# Patient Record
Sex: Female | Born: 1945
Health system: Southern US, Community
[De-identification: ages and names within clinical notes are randomized; demographics above are authoritative.]

## PROBLEM LIST (undated history)

## (undated) DIAGNOSIS — F329 Major depressive disorder, single episode, unspecified: Secondary | ICD-10-CM

## (undated) DIAGNOSIS — M1990 Unspecified osteoarthritis, unspecified site: Secondary | ICD-10-CM

## (undated) DIAGNOSIS — R011 Cardiac murmur, unspecified: Secondary | ICD-10-CM

## (undated) DIAGNOSIS — R42 Dizziness and giddiness: Secondary | ICD-10-CM

## (undated) DIAGNOSIS — L932 Other local lupus erythematosus: Secondary | ICD-10-CM

## (undated) DIAGNOSIS — N189 Chronic kidney disease, unspecified: Secondary | ICD-10-CM

## (undated) DIAGNOSIS — F419 Anxiety disorder, unspecified: Secondary | ICD-10-CM

## (undated) DIAGNOSIS — F32A Depression, unspecified: Secondary | ICD-10-CM

## (undated) DIAGNOSIS — C801 Malignant (primary) neoplasm, unspecified: Secondary | ICD-10-CM

## (undated) DIAGNOSIS — I10 Essential (primary) hypertension: Secondary | ICD-10-CM

## (undated) DIAGNOSIS — K2100 Gastro-esophageal reflux disease with esophagitis, without bleeding: Secondary | ICD-10-CM

## (undated) DIAGNOSIS — M8580 Other specified disorders of bone density and structure, unspecified site: Secondary | ICD-10-CM

## (undated) DIAGNOSIS — N1831 Chronic kidney disease, stage 3a: Secondary | ICD-10-CM

## (undated) DIAGNOSIS — E785 Hyperlipidemia, unspecified: Secondary | ICD-10-CM

## (undated) DIAGNOSIS — G250 Essential tremor: Secondary | ICD-10-CM

## (undated) DIAGNOSIS — M329 Systemic lupus erythematosus, unspecified: Secondary | ICD-10-CM

## (undated) HISTORY — DX: Essential (primary) hypertension: I10

## (undated) HISTORY — DX: Depression, unspecified: F32.A

## (undated) HISTORY — DX: Anxiety disorder, unspecified: F41.9

## (undated) HISTORY — DX: Other local lupus erythematosus: L93.2

## (undated) HISTORY — PX: MOHS SURGERY: SUR867

## (undated) HISTORY — PX: BASAL CELL CARCINOMA EXCISION: SHX1214

## (undated) HISTORY — PX: MASTOIDECTOMY: SHX711

## (undated) HISTORY — DX: Chronic kidney disease, unspecified: N18.9

## (undated) HISTORY — PX: CATARACT EXTRACTION W/ INTRAOCULAR LENS IMPLANT: SHX1309

## (undated) HISTORY — DX: Major depressive disorder, single episode, unspecified: F32.9

## (undated) HISTORY — DX: Hyperlipidemia, unspecified: E78.5

---

## 1998-09-05 HISTORY — PX: BREAST BIOPSY: SHX20

## 2005-09-27 ENCOUNTER — Ambulatory Visit: Payer: Self-pay | Admitting: Gastroenterology

## 2010-12-06 ENCOUNTER — Ambulatory Visit: Payer: Self-pay | Admitting: Family Medicine

## 2010-12-20 ENCOUNTER — Observation Stay: Payer: Self-pay | Admitting: Internal Medicine

## 2011-04-06 ENCOUNTER — Ambulatory Visit: Payer: Self-pay | Admitting: Otolaryngology

## 2011-04-06 HISTORY — PX: IMAGE GUIDED SINUS SURGERY: SHX6570

## 2011-12-08 ENCOUNTER — Ambulatory Visit: Payer: Self-pay | Admitting: Family Medicine

## 2012-12-13 ENCOUNTER — Ambulatory Visit: Payer: Self-pay | Admitting: Family Medicine

## 2013-11-26 ENCOUNTER — Ambulatory Visit: Payer: Self-pay | Admitting: Family Medicine

## 2013-12-16 ENCOUNTER — Ambulatory Visit: Payer: Self-pay | Admitting: Family Medicine

## 2014-12-22 ENCOUNTER — Ambulatory Visit
Admit: 2014-12-22 | Disposition: A | Discharge status: Home / Self Care | Payer: Self-pay | Attending: Family Medicine | Admitting: Family Medicine

## 2015-04-23 DIAGNOSIS — F32A Depression, unspecified: Secondary | ICD-10-CM | POA: Insufficient documentation

## 2015-04-23 DIAGNOSIS — F329 Major depressive disorder, single episode, unspecified: Secondary | ICD-10-CM | POA: Insufficient documentation

## 2015-04-23 DIAGNOSIS — E785 Hyperlipidemia, unspecified: Secondary | ICD-10-CM | POA: Insufficient documentation

## 2015-04-27 ENCOUNTER — Encounter: Payer: Self-pay | Admitting: Family Medicine

## 2015-04-27 ENCOUNTER — Ambulatory Visit (INDEPENDENT_AMBULATORY_CARE_PROVIDER_SITE_OTHER): Payer: Commercial Managed Care - HMO | Admitting: Family Medicine

## 2015-04-27 VITALS — BP 123/71 | HR 66 | Temp 98.6°F | Ht 63.0 in | Wt 131.0 lb

## 2015-04-27 DIAGNOSIS — E785 Hyperlipidemia, unspecified: Secondary | ICD-10-CM

## 2015-04-27 DIAGNOSIS — F32A Depression, unspecified: Secondary | ICD-10-CM

## 2015-04-27 DIAGNOSIS — M76899 Other specified enthesopathies of unspecified lower limb, excluding foot: Secondary | ICD-10-CM | POA: Insufficient documentation

## 2015-04-27 DIAGNOSIS — M65852 Other synovitis and tenosynovitis, left thigh: Secondary | ICD-10-CM | POA: Diagnosis not present

## 2015-04-27 DIAGNOSIS — I10 Essential (primary) hypertension: Secondary | ICD-10-CM | POA: Insufficient documentation

## 2015-04-27 DIAGNOSIS — F329 Major depressive disorder, single episode, unspecified: Secondary | ICD-10-CM | POA: Diagnosis not present

## 2015-04-27 DIAGNOSIS — M76892 Other specified enthesopathies of left lower limb, excluding foot: Secondary | ICD-10-CM

## 2015-04-27 LAB — LP+ALT+AST PICCOLO, WAIVED
ALT (SGPT) Piccolo, Waived: 21 U/L (ref 10–47)
AST (SGOT) PICCOLO, WAIVED: 24 U/L (ref 11–38)
CHOL/HDL RATIO PICCOLO,WAIVE: 2.2 mg/dL
CHOLESTEROL PICCOLO, WAIVED: 165 mg/dL (ref <200)
HDL CHOL PICCOLO, WAIVED: 76 mg/dL (ref >59)
LDL CHOL CALC PICCOLO WAIVED: 63 mg/dL (ref <100)
TRIGLYCERIDES PICCOLO,WAIVED: 130 mg/dL (ref <150)
VLDL Chol Calc Piccolo,Waive: 26 mg/dL (ref <30)

## 2015-04-27 MED ORDER — HYDROCHLOROTHIAZIDE 12.5 MG PO TABS: 12.5000 mg | ORAL_TABLET | Freq: Every day | ORAL | Status: DC

## 2015-04-27 MED ORDER — AMLODIPINE BESYLATE 10 MG PO TABS: 10.0000 mg | ORAL_TABLET | Freq: Every day | ORAL | Status: DC

## 2015-04-27 MED ORDER — BUPROPION HCL ER (SR) 150 MG PO TB12: 150.0000 mg | ORAL_TABLET | Freq: Two times a day (BID) | ORAL | Status: DC

## 2015-04-27 MED ORDER — ATORVASTATIN CALCIUM 20 MG PO TABS: 20.0000 mg | ORAL_TABLET | Freq: Every day | ORAL | Status: DC

## 2015-04-27 MED ORDER — TRAZODONE HCL 50 MG PO TABS: 50.0000 mg | ORAL_TABLET | Freq: Every evening | ORAL | Status: DC | PRN

## 2015-04-27 MED ORDER — BENAZEPRIL HCL 40 MG PO TABS: 40.0000 mg | ORAL_TABLET | Freq: Every day | ORAL | Status: DC

## 2015-04-27 MED ORDER — SERTRALINE HCL 50 MG PO TABS
50.0000 mg | ORAL_TABLET | Freq: Every day | ORAL | Status: DC
Start: 2015-04-27 — End: 2015-06-30

## 2015-04-27 NOTE — Assessment & Plan Note (Signed)
The current medical regimen is effective;  continue present plan and medications.  

## 2015-04-27 NOTE — Assessment & Plan Note (Signed)
Discussed Will and Zoloft 25 mg for 6 days then 50

## 2015-04-27 NOTE — Progress Notes (Signed)
BP 123/71 mmHg  Pulse 66  Temp(Src) 98.6 F (37 C)  Ht 5\' 3"  (1.6 m)  Wt 131 lb (59.421 kg)  BMI 23.21 kg/m2  SpO2 99%   Subjective:    Patient ID: Lindsey Blair, female    DOB: 10-06-1945, 69 y.o.   MRN: [id number]  HPI: Lindsey Blair is a 69 y.o. female  Chief Complaint  Patient presents with  . Hyperlipidemia  . Hypertension  . Depression   patient follow-up above doing well taking blood pressure cholesterol medicine without side effects or problems. Having problems with sleep because left hip hurts which is been a chronic problem for 30+ years starts especially when she lies down to sleep doesn't bother her sitting or being active such as horse riding or walking. Tylenol and Advil and Aleve seems to help just fine not taking Advil etc. because of renal function. Doesn't want to take medications regularly. Reviewed old chart x-ray from 2004 was normal at both hip and spine.  Patient also feels depression is worse may be made worse by hip pain and poor sleep well with treatment starting the spring seem to not work as effectively. Depression scale of 7.   Relevant past medical, surgical, family and social history reviewed and updated as indicated. Interim medical history since our last visit reviewed. Allergies and medications reviewed and updated.  Review of Systems  Constitutional: Negative.   Respiratory: Negative.   Cardiovascular: Negative.     Per HPI unless specifically indicated above     Objective:    BP 123/71 mmHg  Pulse 66  Temp(Src) 98.6 F (37 C)  Ht 5\' 3"  (1.6 m)  Wt 131 lb (59.421 kg)  BMI 23.21 kg/m2  SpO2 99%  Wt Readings from Last 3 Encounters:  04/27/15 131 lb (59.421 kg)  11/20/14 126 lb (57.153 kg)    Physical Exam  Constitutional: She is oriented to person, place, and time. She appears well-developed and well-nourished. No distress.  HENT:  Head: Normocephalic and atraumatic.  Right Ear: Hearing normal.  Left Ear: Hearing  normal.  Nose: Nose normal.  Eyes: Conjunctivae and lids are normal. Right eye exhibits no discharge. Left eye exhibits no discharge. No scleral icterus.  Pulmonary/Chest: Effort normal. No respiratory distress.  Abdominal: Soft. There is no tenderness. There is no rebound.  Musculoskeletal: Normal range of motion.  Hip full range of motion without tenderness cross leg flexion causes pain in her piroformes muscle  Neurological: She is alert and oriented to person, place, and time.  Skin: Skin is intact. No rash noted.  Psychiatric: She has a normal mood and affect. Her speech is normal and behavior is normal. Judgment and thought content normal. Cognition and memory are normal.    No results found for this or any previous visit.    Assessment & Plan:   Problem List Items Addressed This Visit      Cardiovascular and Mediastinum   Hypertension    The current medical regimen is effective;  continue present plan and medications.       Relevant Medications   amLODipine (NORVASC) 10 MG tablet   atorvastatin (LIPITOR) 20 MG tablet   benazepril (LOTENSIN) 40 MG tablet   hydrochlorothiazide (HYDRODIURIL) 12.5 MG tablet   Other Relevant Orders   Basic metabolic panel     Musculoskeletal and Integument   Hip tendonitis    Discussed stretching exercises Using Tylenol        Other   Hyperlipidemia - Primary  The current medical regimen is effective;  continue present plan and medications.       Relevant Medications   amLODipine (NORVASC) 10 MG tablet   atorvastatin (LIPITOR) 20 MG tablet   benazepril (LOTENSIN) 40 MG tablet   hydrochlorothiazide (HYDRODIURIL) 12.5 MG tablet   Other Relevant Orders   LP+ALT+AST Piccolo, Waived   Depression    Discussed Will and Zoloft 25 mg for 6 days then 50      Relevant Medications   buPROPion (WELLBUTRIN SR) 150 MG 12 hr tablet   traZODone (DESYREL) 50 MG tablet   sertraline (ZOLOFT) 50 MG tablet       Follow up plan: Return in  about 2 months (around 06/27/2015), or if symptoms worsen or fail to improve, for f/u meds.

## 2015-04-27 NOTE — Assessment & Plan Note (Signed)
Discussed stretching exercises Using Tylenol

## 2015-04-28 ENCOUNTER — Encounter: Payer: Self-pay | Admitting: Family Medicine

## 2015-04-28 LAB — BASIC METABOLIC PANEL
BUN/Creatinine Ratio: 12 (ref 11–26)
BUN: 12 mg/dL (ref 8–27)
CALCIUM: 9.4 mg/dL (ref 8.7–10.3)
CO2: 25 mmol/L (ref 18–29)
CREATININE: 1.01 mg/dL — AB (ref 0.57–1.00)
Chloride: 95 mmol/L — ABNORMAL LOW (ref 97–108)
GFR, EST AFRICAN AMERICAN: 65 mL/min/{1.73_m2} (ref >59)
GFR, EST NON AFRICAN AMERICAN: 57 mL/min/{1.73_m2} — AB (ref >59)
Glucose: 106 mg/dL — ABNORMAL HIGH (ref 65–99)
POTASSIUM: 4.1 mmol/L (ref 3.5–5.2)
Sodium: 136 mmol/L (ref 134–144)

## 2015-06-29 ENCOUNTER — Ambulatory Visit: Payer: Medicare HMO | Admitting: Family Medicine

## 2015-06-30 ENCOUNTER — Encounter: Payer: Self-pay | Admitting: Family Medicine

## 2015-06-30 ENCOUNTER — Ambulatory Visit (INDEPENDENT_AMBULATORY_CARE_PROVIDER_SITE_OTHER): Payer: Commercial Managed Care - HMO | Admitting: Family Medicine

## 2015-06-30 VITALS — BP 129/74 | HR 62 | Temp 98.8°F | Ht 62.6 in | Wt 130.0 lb

## 2015-06-30 DIAGNOSIS — F329 Major depressive disorder, single episode, unspecified: Secondary | ICD-10-CM | POA: Diagnosis not present

## 2015-06-30 DIAGNOSIS — F32A Depression, unspecified: Secondary | ICD-10-CM

## 2015-06-30 DIAGNOSIS — I10 Essential (primary) hypertension: Secondary | ICD-10-CM

## 2015-06-30 DIAGNOSIS — E785 Hyperlipidemia, unspecified: Secondary | ICD-10-CM | POA: Diagnosis not present

## 2015-06-30 DIAGNOSIS — M65852 Other synovitis and tenosynovitis, left thigh: Secondary | ICD-10-CM

## 2015-06-30 DIAGNOSIS — M76892 Other specified enthesopathies of left lower limb, excluding foot: Secondary | ICD-10-CM

## 2015-06-30 MED ORDER — ZOLPIDEM TARTRATE 5 MG PO TABS: 5.0000 mg | ORAL_TABLET | Freq: Every evening | ORAL | Status: DC | PRN

## 2015-06-30 MED ORDER — AMLODIPINE BESYLATE 10 MG PO TABS: 10.0000 mg | ORAL_TABLET | Freq: Every day | ORAL | Status: DC

## 2015-06-30 MED ORDER — FLUOXETINE HCL 20 MG PO TABS: 20.0000 mg | ORAL_TABLET | Freq: Every day | ORAL | Status: DC

## 2015-06-30 MED ORDER — BENAZEPRIL HCL 40 MG PO TABS: 40.0000 mg | ORAL_TABLET | Freq: Every day | ORAL | Status: DC

## 2015-06-30 MED ORDER — ATORVASTATIN CALCIUM 20 MG PO TABS: 20.0000 mg | ORAL_TABLET | Freq: Every day | ORAL | Status: DC

## 2015-06-30 MED ORDER — BUPROPION HCL ER (SR) 150 MG PO TB12
150.0000 mg | ORAL_TABLET | Freq: Two times a day (BID) | ORAL | Status: DC
Start: 2015-06-30 — End: 2016-01-06

## 2015-06-30 MED ORDER — HYDROCHLOROTHIAZIDE 12.5 MG PO TABS: 12.5000 mg | ORAL_TABLET | Freq: Every day | ORAL | Status: DC

## 2015-06-30 NOTE — Progress Notes (Signed)
BP 129/74 mmHg  Pulse 62  Temp(Src) 98.8 F (37.1 C)  Ht 5' 2.6" (1.59 m)  Wt 130 lb (58.968 kg)  BMI 23.33 kg/m2  SpO2 99%   Subjective:    Patient ID: Lindsey Blair, female    DOB: 08/12/1946, 68 y.o.   MRN: [id number]  HPI: Lindsey Blair is a 69 y.o. female  Chief Complaint  Patient presents with  . Depression   patient doing well depressions larger resolved having better energy and feeling better. Patient is having side effects of loose bowel movements and urgency. Also having some chest fluttering sensation not associated with exertion but more anxiety-like symptoms.  Got released from nephrology as well as renal functions doing well no further scheduled visits with nephrology.  Relevant past medical, surgical, family and social history reviewed and updated as indicated. Interim medical history since our last visit reviewed. Allergies and medications reviewed and updated.  Review of Systems  Constitutional: Negative.   Respiratory: Negative.   Cardiovascular: Negative.     Per HPI unless specifically indicated above     Objective:    BP 129/74 mmHg  Pulse 62  Temp(Src) 98.8 F (37.1 C)  Ht 5' 2.6" (1.59 m)  Wt 130 lb (58.968 kg)  BMI 23.33 kg/m2  SpO2 99%  Wt Readings from Last 3 Encounters:  06/30/15 130 lb (58.968 kg)  04/27/15 131 lb (59.421 kg)  11/20/14 126 lb (57.153 kg)    Physical Exam  Constitutional: She is oriented to person, place, and time. She appears well-developed and well-nourished. No distress.  HENT:  Head: Normocephalic and atraumatic.  Right Ear: Hearing normal.  Left Ear: Hearing normal.  Nose: Nose normal.  Eyes: Conjunctivae and lids are normal. Right eye exhibits no discharge. Left eye exhibits no discharge. No scleral icterus.  Cardiovascular: Normal rate, regular rhythm and normal heart sounds.   Pulmonary/Chest: Effort normal and breath sounds normal. No respiratory distress.  Musculoskeletal: Normal range of motion.   Neurological: She is alert and oriented to person, place, and time.  Skin: Skin is intact. No rash noted.  Psychiatric: She has a normal mood and affect. Her speech is normal and behavior is normal. Judgment and thought content normal. Cognition and memory are normal.    Results for orders placed or performed in visit on 62/69/48  Basic metabolic panel  Result Value Ref Range   Glucose 106 (H) 65 - 99 mg/dL   BUN 12 8 - 27 mg/dL   Creatinine, Ser 1.01 (H) 0.57 - 1.00 mg/dL   GFR calc non Af Amer 57 (L) >59 mL/min/1.73   GFR calc Af Amer 65 >59 mL/min/1.73   BUN/Creatinine Ratio 12 11 - 26   Sodium 136 134 - 144 mmol/L   Potassium 4.1 3.5 - 5.2 mmol/L   Chloride 95 (L) 97 - 108 mmol/L   CO2 25 18 - 29 mmol/L   Calcium 9.4 8.7 - 10.3 mg/dL  LP+ALT+AST Piccolo, Waived  Result Value Ref Range   ALT (SGPT) Piccolo, Waived 21 10 - 47 U/L   AST (SGOT) Piccolo, Waived 24 11 - 38 U/L   Cholesterol Piccolo, Waived 165 <200 mg/dL   HDL Chol Piccolo, Waived 76 >59 mg/dL   Triglycerides Piccolo,Waived 130 <150 mg/dL   Chol/HDL Ratio Piccolo,Waive 2.2 mg/dL   LDL Chol Calc Piccolo Waived 63 <100 mg/dL   VLDL Chol Calc Piccolo,Waive 26 <30 mg/dL      Assessment & Plan:   Problem List Items  Addressed This Visit      Cardiovascular and Mediastinum   Hypertension - Primary    The current medical regimen is effective;  continue present plan and medications.       Relevant Medications   amLODipine (NORVASC) 10 MG tablet   atorvastatin (LIPITOR) 20 MG tablet   benazepril (LOTENSIN) 40 MG tablet   hydrochlorothiazide (HYDRODIURIL) 12.5 MG tablet     Musculoskeletal and Integument   Hip tendonitis    Doing better        Other   Hyperlipidemia   Relevant Medications   amLODipine (NORVASC) 10 MG tablet   atorvastatin (LIPITOR) 20 MG tablet   benazepril (LOTENSIN) 40 MG tablet   hydrochlorothiazide (HYDRODIURIL) 12.5 MG tablet   Depression    Doing much better. But having side  effects so will discontinue begin fluoxetine      Relevant Medications   buPROPion (WELLBUTRIN SR) 150 MG 12 hr tablet   FLUoxetine (PROZAC) 20 MG tablet       Follow up plan: Return in about 3 months (around 09/30/2015).

## 2015-06-30 NOTE — Assessment & Plan Note (Signed)
The current medical regimen is effective;  continue present plan and medications.  

## 2015-06-30 NOTE — Assessment & Plan Note (Signed)
Doing better.   

## 2015-06-30 NOTE — Assessment & Plan Note (Signed)
Doing much better. But having side effects so will discontinue begin fluoxetine

## 2015-07-01 ENCOUNTER — Telehealth: Payer: Self-pay

## 2015-07-01 NOTE — Telephone Encounter (Signed)
Medication you gave her "doesn't work worth a flip"  She wants what you gave her in 2014, I looked  Ambien 10mg  was late 2013  She would like you to send a 90 day rx to [facility]

## 2015-07-01 NOTE — Telephone Encounter (Signed)
Call plz 

## 2015-07-27 ENCOUNTER — Other Ambulatory Visit: Payer: Self-pay | Admitting: Family Medicine

## 2015-10-07 ENCOUNTER — Encounter: Payer: Self-pay | Admitting: Family Medicine

## 2015-10-07 ENCOUNTER — Ambulatory Visit (INDEPENDENT_AMBULATORY_CARE_PROVIDER_SITE_OTHER): Payer: PPO | Admitting: Family Medicine

## 2015-10-07 VITALS — BP 134/70 | HR 65 | Temp 98.7°F | Ht 63.0 in | Wt 132.0 lb

## 2015-10-07 DIAGNOSIS — I10 Essential (primary) hypertension: Secondary | ICD-10-CM | POA: Diagnosis not present

## 2015-10-07 DIAGNOSIS — F329 Major depressive disorder, single episode, unspecified: Secondary | ICD-10-CM | POA: Diagnosis not present

## 2015-10-07 DIAGNOSIS — Z1211 Encounter for screening for malignant neoplasm of colon: Secondary | ICD-10-CM

## 2015-10-07 DIAGNOSIS — F32A Depression, unspecified: Secondary | ICD-10-CM

## 2015-10-07 MED ORDER — CITALOPRAM HYDROBROMIDE 20 MG PO TABS: 20.0000 mg | ORAL_TABLET | Freq: Every day | ORAL | Status: DC

## 2015-10-07 NOTE — Assessment & Plan Note (Signed)
The current medical regimen is effective;  continue present plan and medications.  

## 2015-10-07 NOTE — Progress Notes (Signed)
BP 134/70 mmHg  Pulse 65  Temp(Src) 98.7 F (37.1 C)  Ht 5\' 3"  (1.6 m)  Wt 132 lb (59.875 kg)  BMI 23.39 kg/m2  SpO2 99%   Subjective:    Patient ID: Lindsey Blair, female    DOB: [birth year], 70 y.o.   MRN: [id number]  HPI: Lindsey Blair is a 70 y.o. female  Chief Complaint  Patient presents with  . Anxiety  . patient due Td    cut on right thumb   cut thumb Yesterday Doing Okay healing well gave patient fresh Band-Aid here in the office Also fluoxetine is not as good as Zoloft but was having side effects with Zoloft will switch to citalopram Blood pressures doing well no complaints Relevant past medical, surgical, family and social history reviewed and updated as indicated. Interim medical history since our last visit reviewed. Allergies and medications reviewed and updated.  Review of Systems  Constitutional: Negative.   Respiratory: Negative.   Cardiovascular: Negative.     Per HPI unless specifically indicated above     Objective:    BP 134/70 mmHg  Pulse 65  Temp(Src) 98.7 F (37.1 C)  Ht 5\' 3"  (1.6 m)  Wt 132 lb (59.875 kg)  BMI 23.39 kg/m2  SpO2 99%  Wt Readings from Last 3 Encounters:  10/07/15 132 lb (59.875 kg)  06/30/15 130 lb (58.968 kg)  04/27/15 131 lb (59.421 kg)    Physical Exam  Constitutional: She is oriented to person, place, and time. She appears well-developed and well-nourished. No distress.  HENT:  Head: Normocephalic and atraumatic.  Right Ear: Hearing normal.  Left Ear: Hearing normal.  Nose: Nose normal.  Eyes: Conjunctivae and lids are normal. Right eye exhibits no discharge. Left eye exhibits no discharge. No scleral icterus.  Cardiovascular: Normal rate, regular rhythm and normal heart sounds.   Pulmonary/Chest: Effort normal and breath sounds normal. No respiratory distress.  Musculoskeletal: Normal range of motion.  Neurological: She is alert and oriented to person, place, and time.  Skin: Skin is intact. No rash  noted.  Psychiatric: She has a normal mood and affect. Her speech is normal and behavior is normal. Judgment and thought content normal. Cognition and memory are normal.    Results for orders placed or performed in visit on [id number]  Basic metabolic panel  Result Value Ref Range   Glucose 106 (H) 65 - 99 mg/dL   BUN 12 8 - 27 mg/dL   Creatinine, Ser 1.01 (H) 0.57 - 1.00 mg/dL   GFR calc non Af Amer 57 (L) >59 mL/min/1.73   GFR calc Af Amer 65 >59 mL/min/1.73   BUN/Creatinine Ratio 12 11 - 26   Sodium 136 134 - 144 mmol/L   Potassium 4.1 3.5 - 5.2 mmol/L   Chloride 95 (L) 97 - 108 mmol/L   CO2 25 18 - 29 mmol/L   Calcium 9.4 8.7 - 10.3 mg/dL  LP+ALT+AST Piccolo, Waived  Result Value Ref Range   ALT (SGPT) Piccolo, Waived 21 10 - 47 U/L   AST (SGOT) Piccolo, Waived 24 11 - 38 U/L   Cholesterol Piccolo, Waived 165 <200 mg/dL   HDL Chol Piccolo, Waived 76 >59 mg/dL   Triglycerides Piccolo,Waived 130 <150 mg/dL   Chol/HDL Ratio Piccolo,Waive 2.2 mg/dL   LDL Chol Calc Piccolo Waived 63 <100 mg/dL   VLDL Chol Calc Piccolo,Waive 26 <30 mg/dL      Assessment & Plan:   Problem List Items Addressed This Visit  Cardiovascular and Mediastinum   Hypertension    The current medical regimen is effective;  continue present plan and medications.         Other   Depression    We will start fluoxetine start citalopram Recheck at physical earlier if problems      Relevant Medications   citalopram (CELEXA) 20 MG tablet    Other Visit Diagnoses    Colon cancer screening    -  Primary    Relevant Orders    Ambulatory referral to General Surgery        Follow up plan: Return in about 3 months (around 01/04/2016) for Physical Exam.

## 2015-10-07 NOTE — Assessment & Plan Note (Signed)
We will start fluoxetine start citalopram Recheck at physical earlier if problems

## 2015-10-08 ENCOUNTER — Ambulatory Visit (INDEPENDENT_AMBULATORY_CARE_PROVIDER_SITE_OTHER): Payer: PPO

## 2015-10-08 DIAGNOSIS — S61001A Unspecified open wound of right thumb without damage to nail, initial encounter: Secondary | ICD-10-CM

## 2015-10-08 DIAGNOSIS — Z23 Encounter for immunization: Secondary | ICD-10-CM

## 2015-10-09 ENCOUNTER — Other Ambulatory Visit: Payer: Self-pay

## 2015-10-09 ENCOUNTER — Telehealth: Payer: Self-pay

## 2015-10-09 NOTE — Telephone Encounter (Signed)
Gastroenterology Pre-Procedure Review  Request Date: 10/22/15 Requesting Physician: Dr. Jeananne Rama  PATIENT REVIEW QUESTIONS: The patient responded to the following health history questions as indicated:    1. Are you having any GI issues? no 2. Do you have a personal history of Polyps? no 3. Do you have a family history of Colon Cancer or Polyps? no 4. Diabetes Mellitus? no 5. Joint replacements in the past 12 months?no 6. Major health problems in the past 3 months?no 7. Any artificial heart valves, MVP, or defibrillator?no    MEDICATIONS & ALLERGIES:    Patient reports the following regarding taking any anticoagulation/antiplatelet therapy:   Plavix, Coumadin, Eliquis, Xarelto, Lovenox, Pradaxa, Brilinta, or Effient? no Aspirin? yes (ASA 81mg )  Patient confirms/reports the following medications:  Current Outpatient Prescriptions  Medication Sig Dispense Refill  . amLODipine (NORVASC) 10 MG tablet Take 1 tablet (10 mg total) by mouth daily. 90 tablet 2  . aspirin EC 81 MG tablet Take 81 mg by mouth daily.    Marland Kitchen atorvastatin (LIPITOR) 20 MG tablet Take 1 tablet (20 mg total) by mouth daily. 90 tablet 2  . benazepril (LOTENSIN) 40 MG tablet Take 1 tablet (40 mg total) by mouth daily. 90 tablet 2  . Biotin 5000 MCG TABS Take by mouth daily.    Marland Kitchen buPROPion (WELLBUTRIN SR) 150 MG 12 hr tablet Take 1 tablet (150 mg total) by mouth 2 (two) times daily. 180 tablet 2  . citalopram (CELEXA) 20 MG tablet Take 1 tablet (20 mg total) by mouth daily. 30 tablet 3  . hydrochlorothiazide (HYDRODIURIL) 12.5 MG tablet Take 1 tablet (12.5 mg total) by mouth daily. 90 tablet 2  . hydroxychloroquine (PLAQUENIL) 200 MG tablet     . zolpidem (AMBIEN) 5 MG tablet Take 1 tablet (5 mg total) by mouth at bedtime as needed for sleep. 20 tablet 1   No current facility-administered medications for this visit.    Patient confirms/reports the following allergies:  Allergies  Allergen Reactions  . Penicillins  Rash    No orders of the defined types were placed in this encounter.    AUTHORIZATION INFORMATION Primary Insurance: 1D#: Group #:  Secondary Insurance: 1D#: Group #:  SCHEDULE INFORMATION: Date: 10/22/15 Time: Location: [place]

## 2015-10-15 ENCOUNTER — Encounter: Payer: Self-pay | Admitting: *Deleted

## 2015-10-21 NOTE — Discharge Instructions (Signed)

## 2015-10-22 ENCOUNTER — Ambulatory Visit
Admission: RE | Admit: 2015-10-22 | Discharge: 2015-10-22 | Disposition: A | Discharge status: Home / Self Care | Payer: PPO | Source: Ambulatory Visit | Attending: Gastroenterology | Admitting: Gastroenterology

## 2015-10-22 ENCOUNTER — Encounter
Admission: RE | Disposition: A | Discharge status: Home / Self Care | Payer: Self-pay | Source: Ambulatory Visit | Attending: Gastroenterology

## 2015-10-22 ENCOUNTER — Ambulatory Visit: Payer: PPO | Admitting: Anesthesiology

## 2015-10-22 ENCOUNTER — Encounter: Payer: Self-pay | Admitting: *Deleted

## 2015-10-22 DIAGNOSIS — K2100 Gastro-esophageal reflux disease with esophagitis, without bleeding: Secondary | ICD-10-CM | POA: Insufficient documentation

## 2015-10-22 DIAGNOSIS — Z823 Family history of stroke: Secondary | ICD-10-CM | POA: Diagnosis not present

## 2015-10-22 DIAGNOSIS — R1013 Epigastric pain: Secondary | ICD-10-CM | POA: Insufficient documentation

## 2015-10-22 DIAGNOSIS — F329 Major depressive disorder, single episode, unspecified: Secondary | ICD-10-CM | POA: Insufficient documentation

## 2015-10-22 DIAGNOSIS — Z7982 Long term (current) use of aspirin: Secondary | ICD-10-CM | POA: Diagnosis not present

## 2015-10-22 DIAGNOSIS — I129 Hypertensive chronic kidney disease with stage 1 through stage 4 chronic kidney disease, or unspecified chronic kidney disease: Secondary | ICD-10-CM | POA: Diagnosis not present

## 2015-10-22 DIAGNOSIS — L932 Other local lupus erythematosus: Secondary | ICD-10-CM | POA: Diagnosis not present

## 2015-10-22 DIAGNOSIS — Z809 Family history of malignant neoplasm, unspecified: Secondary | ICD-10-CM | POA: Diagnosis not present

## 2015-10-22 DIAGNOSIS — E785 Hyperlipidemia, unspecified: Secondary | ICD-10-CM | POA: Insufficient documentation

## 2015-10-22 DIAGNOSIS — K2970 Gastritis, unspecified, without bleeding: Secondary | ICD-10-CM | POA: Diagnosis not present

## 2015-10-22 DIAGNOSIS — R42 Dizziness and giddiness: Secondary | ICD-10-CM | POA: Diagnosis not present

## 2015-10-22 DIAGNOSIS — K640 First degree hemorrhoids: Secondary | ICD-10-CM | POA: Diagnosis not present

## 2015-10-22 DIAGNOSIS — F419 Anxiety disorder, unspecified: Secondary | ICD-10-CM | POA: Insufficient documentation

## 2015-10-22 DIAGNOSIS — K449 Diaphragmatic hernia without obstruction or gangrene: Secondary | ICD-10-CM | POA: Diagnosis not present

## 2015-10-22 DIAGNOSIS — K5730 Diverticulosis of large intestine without perforation or abscess without bleeding: Secondary | ICD-10-CM | POA: Diagnosis not present

## 2015-10-22 DIAGNOSIS — Z880 Allergy status to penicillin: Secondary | ICD-10-CM | POA: Insufficient documentation

## 2015-10-22 DIAGNOSIS — Z87891 Personal history of nicotine dependence: Secondary | ICD-10-CM | POA: Insufficient documentation

## 2015-10-22 DIAGNOSIS — Z1211 Encounter for screening for malignant neoplasm of colon: Secondary | ICD-10-CM | POA: Insufficient documentation

## 2015-10-22 DIAGNOSIS — N189 Chronic kidney disease, unspecified: Secondary | ICD-10-CM | POA: Diagnosis not present

## 2015-10-22 DIAGNOSIS — K2930 Chronic superficial gastritis without bleeding: Secondary | ICD-10-CM | POA: Diagnosis not present

## 2015-10-22 DIAGNOSIS — Z79899 Other long term (current) drug therapy: Secondary | ICD-10-CM | POA: Insufficient documentation

## 2015-10-22 DIAGNOSIS — Z8249 Family history of ischemic heart disease and other diseases of the circulatory system: Secondary | ICD-10-CM | POA: Diagnosis not present

## 2015-10-22 DIAGNOSIS — R1011 Right upper quadrant pain: Secondary | ICD-10-CM | POA: Diagnosis not present

## 2015-10-22 HISTORY — PX: ESOPHAGOGASTRODUODENOSCOPY (EGD) WITH PROPOFOL: SHX5813

## 2015-10-22 HISTORY — DX: Dizziness and giddiness: R42

## 2015-10-22 HISTORY — PX: COLONOSCOPY WITH PROPOFOL: SHX5780

## 2015-10-22 SURGERY — COLONOSCOPY WITH PROPOFOL
Anesthesia: Monitor Anesthesia Care | Wound class: Contaminated

## 2015-10-22 MED ORDER — LIDOCAINE HCL (CARDIAC) 20 MG/ML IV SOLN
INTRAVENOUS | Status: DC | PRN
  Administered 2015-10-22: 50 mg via INTRAVENOUS

## 2015-10-22 MED ORDER — STERILE WATER FOR IRRIGATION IR SOLN
Status: DC | PRN
  Administered 2015-10-22: 10:00:00

## 2015-10-22 MED ORDER — PROPOFOL 10 MG/ML IV BOLUS
INTRAVENOUS | Status: DC | PRN
  Administered 2015-10-22 (×3): 20 mg via INTRAVENOUS
  Administered 2015-10-22: 30 mg via INTRAVENOUS
  Administered 2015-10-22: 70 mg via INTRAVENOUS
  Administered 2015-10-22: 10 mg via INTRAVENOUS
  Administered 2015-10-22: 20 mg via INTRAVENOUS
  Administered 2015-10-22: 10 mg via INTRAVENOUS
  Administered 2015-10-22: 20 mg via INTRAVENOUS
  Administered 2015-10-22: 30 mg via INTRAVENOUS

## 2015-10-22 MED ORDER — GLYCOPYRROLATE 0.2 MG/ML IJ SOLN
INTRAMUSCULAR | Status: DC | PRN
  Administered 2015-10-22: 0.2 mg via INTRAVENOUS

## 2015-10-22 MED ORDER — ACETAMINOPHEN 160 MG/5ML PO SOLN: 325.0000 mg | ORAL | Status: DC | PRN

## 2015-10-22 MED ORDER — LACTATED RINGERS IV SOLN
INTRAVENOUS | Status: DC
  Administered 2015-10-22: 09:00:00 via INTRAVENOUS

## 2015-10-22 MED ORDER — ACETAMINOPHEN 325 MG PO TABS: 325.0000 mg | ORAL_TABLET | ORAL | Status: DC | PRN

## 2015-10-22 SURGICAL SUPPLY — 39 items

## 2015-10-22 NOTE — Op Note (Signed)
[facility] Gastroenterology Patient Name: Lindsey Blair Procedure Date: 10/22/2015 9:26 AM MRN: [id number] Account #: [id number] Date of Birth: [birth year] Admit Type: Outpatient Age: 70 Room: [facility] OR ROOM 01 Gender: Female Note Status: Finalized Procedure:            Upper GI endoscopy Indications:          Epigastric abdominal pain Providers:            Lucilla Lame, MD Referring MD:         Guadalupe Maple, MD (Referring MD) Medicines:            Propofol per Anesthesia Complications:        No immediate complications. Procedure:            Pre-Anesthesia Assessment:                       - Prior to the procedure, a History and Physical was                        performed, and patient medications and allergies were                        reviewed. The patient's tolerance of previous                        anesthesia was also reviewed. The risks and benefits of                        the procedure and the sedation options and risks were                        discussed with the patient. All questions were                        answered, and informed consent was obtained. Prior                        Anticoagulants: The patient has taken no previous                        anticoagulant or antiplatelet agents. ASA Grade                        Assessment: II - A patient with mild systemic disease.                        After reviewing the risks and benefits, the patient was                        deemed in satisfactory condition to undergo the                        procedure.                       After obtaining informed consent, the endoscope was                        passed under direct vision. Throughout the procedure,  the patient's blood pressure, pulse, and oxygen                        saturations were monitored continuously. The Olympus                        GIF H180J endoscope (S#: [id number]) was introduced    through the mouth, and advanced to the second part of                        duodenum. The upper GI endoscopy was accomplished                        without difficulty. The patient tolerated the procedure                        well. Findings:      A small hiatal hernia was present.      LA Grade A (one or more mucosal breaks less than 5 mm, not extending       between tops of 2 mucosal folds) esophagitis with no bleeding was found       at the gastroesophageal junction.      Localized severe inflammation characterized by shallow ulcerations was       found in the gastric antrum. Biopsies were taken with a cold forceps for       histology.      The examined duodenum was normal. Impression:           - Small hiatal hernia.                       - LA Grade A reflux esophagitis.                       - Gastritis. Biopsied.                       - Normal examined duodenum. Recommendation:       - Await pathology results. Procedure Code(s):    --- Professional ---                       [phone], Esophagogastroduodenoscopy, flexible, transoral;                        with biopsy, single or multiple Diagnosis Code(s):    --- Professional ---                       R10.13, Epigastric pain                       K44.9, Diaphragmatic hernia without obstruction or                        gangrene                       K21.0, Gastro-esophageal reflux disease with esophagitis                       K29.70, Gastritis, unspecified, without bleeding CPT copyright 2016 American Medical Association. All rights reserved. The codes documented in this report are preliminary and upon coder review may  be revised to meet current compliance requirements. Lucilla Lame, MD 10/22/2015 9:41:10 AM This report has been signed electronically. Number of Addenda: 0 Note Initiated On: 10/22/2015 9:26 AM Total Procedure Duration: 0 hours 1 minute 58 seconds       [facility]

## 2015-10-22 NOTE — Anesthesia Preprocedure Evaluation (Signed)
Anesthesia Evaluation  Patient identified by MRN, date of birth, ID band  Reviewed: Allergy & Precautions, H&P , NPO status , Patient's Chart, lab work & pertinent test results  Airway Mallampati: II  TM Distance: >3 FB Neck ROM: full    Dental no notable dental hx.    Pulmonary former smoker,    Pulmonary exam normal        Cardiovascular hypertension,  Rhythm:regular Rate:Normal     Neuro/Psych PSYCHIATRIC DISORDERS    GI/Hepatic   Endo/Other    Renal/GU      Musculoskeletal   Abdominal   Peds  Hematology   Anesthesia Other Findings   Reproductive/Obstetrics                             Anesthesia Physical Anesthesia Plan  ASA: II  Anesthesia Plan: MAC   Post-op Pain Management:    Induction:   Airway Management Planned:   Additional Equipment:   Intra-op Plan:   Post-operative Plan:   Informed Consent: I have reviewed the patients History and Physical, chart, labs and discussed the procedure including the risks, benefits and alternatives for the proposed anesthesia with the patient or authorized representative who has indicated his/her understanding and acceptance.     Plan Discussed with: CRNA  Anesthesia Plan Comments:         Anesthesia Quick Evaluation

## 2015-10-22 NOTE — Anesthesia Postprocedure Evaluation (Signed)
Anesthesia Post Note  Patient: Lindsey Blair  Procedure(s) Performed: Procedure(s) (LRB): COLONOSCOPY WITH PROPOFOL (N/A) ESOPHAGOGASTRODUODENOSCOPY (EGD) WITH PROPOFOL (N/A)  Patient location during evaluation: PACU Anesthesia Type: MAC Level of consciousness: awake and alert and oriented Pain management: satisfactory to patient Vital Signs Assessment: post-procedure vital signs reviewed and stable Respiratory status: spontaneous breathing, nonlabored ventilation and respiratory function stable Cardiovascular status: blood pressure returned to baseline and stable Postop Assessment: Adequate PO intake and No signs of nausea or vomiting Anesthetic complications: no    Raliegh Ip

## 2015-10-22 NOTE — Anesthesia Procedure Notes (Signed)
Procedure Name: MAC Performed by: Volanda Mangine Pre-anesthesia Checklist: Patient identified, Emergency Drugs available, Suction available, Timeout performed and Patient being monitored Patient Re-evaluated:Patient Re-evaluated prior to inductionOxygen Delivery Method: Nasal cannula Placement Confirmation: positive ETCO2       

## 2015-10-22 NOTE — H&P (Signed)
[facility]  [address] [place], [place] [address] Phone: [phone] Fax : [phone]  Primary Care Physician:  Golden Pop, MD Primary Gastroenterologist:  Dr. Allen Norris  Pre-Procedure History & Physical: HPI:  Lindsey Blair is a 70 y.o. female is here for an endoscopy and colonoscopy.   Past Medical History  Diagnosis Date  . Anxiety   . Hyperlipidemia   . Hypertension   . Depression   . Cutaneous lupus erythematosus   . Chronic kidney disease   . Vertigo     rare    Past Surgical History  Procedure Laterality Date  . Mastoidectomy    . Image guided sinus surgery  04/06/11    [facility], Dr. Pryor Ochoa    Prior to Admission medications   Medication Sig Start Date End Date Taking? Authorizing Provider  amLODipine (NORVASC) 10 MG tablet Take 1 tablet (10 mg total) by mouth daily. 06/30/15  Yes Guadalupe Maple, MD  aspirin EC 81 MG tablet Take 81 mg by mouth daily.   Yes Historical Provider, MD  atorvastatin (LIPITOR) 20 MG tablet Take 1 tablet (20 mg total) by mouth daily. 06/30/15  Yes Guadalupe Maple, MD  benazepril (LOTENSIN) 40 MG tablet Take 1 tablet (40 mg total) by mouth daily. 06/30/15  Yes Guadalupe Maple, MD  Biotin 5000 MCG TABS Take by mouth daily.   Yes Historical Provider, MD  buPROPion (WELLBUTRIN SR) 150 MG 12 hr tablet Take 1 tablet (150 mg total) by mouth 2 (two) times daily. 06/30/15  Yes Guadalupe Maple, MD  citalopram (CELEXA) 20 MG tablet Take 1 tablet (20 mg total) by mouth daily. 10/07/15  Yes Guadalupe Maple, MD  hydrochlorothiazide (HYDRODIURIL) 12.5 MG tablet Take 1 tablet (12.5 mg total) by mouth daily. 06/30/15  Yes Guadalupe Maple, MD  ranitidine (ZANTAC) 75 MG tablet Take 75 mg by mouth 2 (two) times daily as needed for heartburn.   Yes Historical Provider, MD    Allergies as of 10/09/2015 - Review Complete 10/09/2015  Allergen Reaction Noted  . Penicillins Rash 04/23/2015    Family History  Problem Relation Age of Onset    . Heart disease Mother   . Cancer Father   . Stroke Sister   . Cancer Brother     Social History   Social History  . Marital Status: Married    Spouse Name: N/A  . Number of Children: N/A  . Years of Education: N/A   Occupational History  . Not on file.   Social History Main Topics  . Smoking status: Former Smoker    Types: Cigarettes    Quit date: 03/08/1965  . Smokeless tobacco: Never Used  . Alcohol Use: 8.4 oz/week    7 Glasses of wine, 7 Cans of beer, 0 Standard drinks or equivalent per week  . Drug Use: No  . Sexual Activity: Not on file   Other Topics Concern  . Not on file   Social History Narrative    Review of Systems: See HPI, otherwise negative ROS  Physical Exam: BP 131/68 mmHg  Pulse 62  Temp(Src) 97.5 F (36.4 C)  Resp 16  Ht 5\' 3"  (1.6 m)  Wt 129 lb (58.514 kg)  BMI 22.86 kg/m2  SpO2 100% General:   Alert,  pleasant and cooperative in NAD Head:  Normocephalic and atraumatic. Neck:  Supple; no masses or thyromegaly. Lungs:  Clear throughout to auscultation.    Heart:  Regular rate and rhythm. Abdomen:  Soft, nontender and nondistended. Normal bowel sounds, without guarding, and without rebound.   Neurologic:  Alert and  oriented x4;  grossly normal neurologically.  Impression/Plan: Lindsey Blair is here for an endoscopy and colonoscopy to be performed for epigastric pain and screening  Risks, benefits, limitations, and alternatives regarding  endoscopy and colonoscopy have been reviewed with the patient.  Questions have been answered.  All parties agreeable.   Ollen Bowl, MD  10/22/2015, 8:59 AM

## 2015-10-22 NOTE — Transfer of Care (Signed)
Immediate Anesthesia Transfer of Care Note  Patient: Lindsey Blair  Procedure(s) Performed: Procedure(s): COLONOSCOPY WITH PROPOFOL (N/A) ESOPHAGOGASTRODUODENOSCOPY (EGD) WITH PROPOFOL (N/A)  Patient Location: PACU  Anesthesia Type: MAC  Level of Consciousness: awake, alert  and patient cooperative  Airway and Oxygen Therapy: Patient Spontanous Breathing and Patient connected to supplemental oxygen  Post-op Assessment: Post-op Vital signs reviewed, Patient's Cardiovascular Status Stable, Respiratory Function Stable, Patent Airway and No signs of Nausea or vomiting  Post-op Vital Signs: Reviewed and stable  Complications: No apparent anesthesia complications

## 2015-10-22 NOTE — Op Note (Signed)
[facility] Patient Name: Lindsey Blair Procedure Date: 10/22/2015 9:26 AM MRN: [id number] Account #: [id number] Date of Birth: Apr 26, 1946 Admit Type: Outpatient Age: 70 Room: [facility] OR ROOM 01 Gender: Female Note Status: Finalized Procedure:            Colonoscopy Indications:          Screening for colorectal malignant neoplasm Providers:            Lucilla Lame, MD Medicines:            Propofol per Anesthesia Complications:        No immediate complications. Procedure:            Pre-Anesthesia Assessment:                       - Prior to the procedure, a History and Physical was                        performed, and patient medications and allergies were                        reviewed. The patient's tolerance of previous                        anesthesia was also reviewed. The risks and benefits of                        the procedure and the sedation options and risks were                        discussed with the patient. All questions were                        answered, and informed consent was obtained. Prior                        Anticoagulants: The patient has taken no previous                        anticoagulant or antiplatelet agents. ASA Grade                        Assessment: II - A patient with mild systemic disease.                        After reviewing the risks and benefits, the patient was                        deemed in satisfactory condition to undergo the                        procedure.                       After obtaining informed consent, the colonoscope was                        passed under direct vision. Throughout the procedure,                        the patient's blood pressure, pulse,  and oxygen                        saturations were monitored continuously. The Olympus                        CF-HQ190L Colonoscope (S#. [id number]) was introduced                        through the anus and advanced to the the  cecum,                        identified by appendiceal orifice and ileocecal valve.                        The colonoscopy was performed without difficulty. The                        patient tolerated the procedure well. The quality of                        the bowel preparation was adequate. Findings:      The perianal and digital rectal examinations were normal.      Multiple small-mouthed diverticula were found in the sigmoid colon.      Non-bleeding internal hemorrhoids were found during retroflexion. The       hemorrhoids were Grade I (internal hemorrhoids that do not prolapse). Impression:           - Diverticulosis in the sigmoid colon.                       - Non-bleeding internal hemorrhoids.                       - No specimens collected. Recommendation:       - Repeat colonoscopy in 10 years for screening unless                        any change in family history or lower GI problems. Procedure Code(s):    --- Professional ---                       [id number], Colonoscopy, flexible; diagnostic, including                        collection of specimen(s) by brushing or washing, when                        performed (separate procedure) Diagnosis Code(s):    --- Professional ---                       Z12.11, Encounter for screening for malignant neoplasm                        of colon CPT copyright 2016 American Medical Association. All rights reserved. The codes documented in this report are preliminary and upon coder review may  be revised to meet current compliance requirements. Lucilla Lame, MD 10/22/2015 9:54:48 AM This report has been signed electronically. Number of Addenda: 0 Note Initiated On: 10/22/2015 9:26 AM Scope Withdrawal Time: 0 hours 6 minutes 36  seconds  Total Procedure Duration: 0 hours 9 minutes 49 seconds       [facility]

## 2015-10-23 ENCOUNTER — Encounter: Payer: Self-pay | Admitting: Gastroenterology

## 2015-10-27 ENCOUNTER — Encounter: Payer: Self-pay | Admitting: Gastroenterology

## 2015-11-03 ENCOUNTER — Telehealth: Payer: Self-pay | Admitting: Gastroenterology

## 2015-11-03 ENCOUNTER — Other Ambulatory Visit: Payer: Self-pay

## 2015-11-03 DIAGNOSIS — K2100 Gastro-esophageal reflux disease with esophagitis, without bleeding: Secondary | ICD-10-CM

## 2015-11-03 MED ORDER — DEXLANSOPRAZOLE 60 MG PO CPDR: 60.0000 mg | DELAYED_RELEASE_CAPSULE | Freq: Every day | ORAL | Status: DC

## 2015-11-03 NOTE — Telephone Encounter (Signed)
New rx for Dexilant has been sent to pt's pharmacy.

## 2015-11-03 NOTE — Telephone Encounter (Signed)
Dr. Allen Norris wanted to call her in a new medication for the inflammation in her stomach lining. She wasn't sure what it was called.

## 2015-11-27 ENCOUNTER — Encounter: Payer: Self-pay | Admitting: Gastroenterology

## 2015-12-07 ENCOUNTER — Encounter: Payer: Self-pay | Admitting: Gastroenterology

## 2015-12-07 DIAGNOSIS — R809 Proteinuria, unspecified: Secondary | ICD-10-CM | POA: Diagnosis not present

## 2015-12-07 DIAGNOSIS — I10 Essential (primary) hypertension: Secondary | ICD-10-CM | POA: Diagnosis not present

## 2015-12-07 DIAGNOSIS — M329 Systemic lupus erythematosus, unspecified: Secondary | ICD-10-CM | POA: Diagnosis not present

## 2015-12-11 ENCOUNTER — Telehealth: Payer: Self-pay | Admitting: Gastroenterology

## 2015-12-11 ENCOUNTER — Other Ambulatory Visit: Payer: Self-pay

## 2015-12-11 MED ORDER — PANTOPRAZOLE SODIUM 40 MG PO TBEC: 40.0000 mg | DELAYED_RELEASE_TABLET | Freq: Every day | ORAL | Status: DC

## 2015-12-11 NOTE — Telephone Encounter (Signed)
Pt notified I have sent the Pantoprazole to her pharmacy.

## 2015-12-11 NOTE — Telephone Encounter (Signed)
Patient called and said her insurance will not cover [place]. Can you give her something else.

## 2016-01-04 DIAGNOSIS — D2261 Melanocytic nevi of right upper limb, including shoulder: Secondary | ICD-10-CM | POA: Diagnosis not present

## 2016-01-04 DIAGNOSIS — D225 Melanocytic nevi of trunk: Secondary | ICD-10-CM | POA: Diagnosis not present

## 2016-01-04 DIAGNOSIS — L821 Other seborrheic keratosis: Secondary | ICD-10-CM | POA: Diagnosis not present

## 2016-01-04 DIAGNOSIS — Z85828 Personal history of other malignant neoplasm of skin: Secondary | ICD-10-CM | POA: Diagnosis not present

## 2016-01-06 ENCOUNTER — Ambulatory Visit (INDEPENDENT_AMBULATORY_CARE_PROVIDER_SITE_OTHER): Payer: PPO | Admitting: Family Medicine

## 2016-01-06 ENCOUNTER — Encounter: Payer: Self-pay | Admitting: Family Medicine

## 2016-01-06 VITALS — BP 125/74 | HR 59 | Temp 98.1°F | Ht 63.3 in | Wt 127.0 lb

## 2016-01-06 DIAGNOSIS — K2100 Gastro-esophageal reflux disease with esophagitis, without bleeding: Secondary | ICD-10-CM

## 2016-01-06 DIAGNOSIS — Z0000 Encounter for general adult medical examination without abnormal findings: Secondary | ICD-10-CM | POA: Diagnosis not present

## 2016-01-06 DIAGNOSIS — E785 Hyperlipidemia, unspecified: Secondary | ICD-10-CM

## 2016-01-06 DIAGNOSIS — I10 Essential (primary) hypertension: Secondary | ICD-10-CM | POA: Diagnosis not present

## 2016-01-06 DIAGNOSIS — L932 Other local lupus erythematosus: Secondary | ICD-10-CM | POA: Diagnosis not present

## 2016-01-06 DIAGNOSIS — M7632 Iliotibial band syndrome, left leg: Secondary | ICD-10-CM

## 2016-01-06 DIAGNOSIS — F329 Major depressive disorder, single episode, unspecified: Secondary | ICD-10-CM

## 2016-01-06 DIAGNOSIS — Z1231 Encounter for screening mammogram for malignant neoplasm of breast: Secondary | ICD-10-CM

## 2016-01-06 DIAGNOSIS — F32A Depression, unspecified: Secondary | ICD-10-CM

## 2016-01-06 DIAGNOSIS — M7630 Iliotibial band syndrome, unspecified leg: Secondary | ICD-10-CM | POA: Insufficient documentation

## 2016-01-06 LAB — MICROSCOPIC EXAMINATION
BACTERIA UA: NONE SEEN
EPITHELIAL CELLS (NON RENAL): NONE SEEN /HPF (ref 0–10)
RBC, UA: NONE SEEN /hpf (ref 0–?)
WBC, UA: NONE SEEN /hpf (ref 0–?)

## 2016-01-06 LAB — URINALYSIS, ROUTINE W REFLEX MICROSCOPIC
Bilirubin, UA: NEGATIVE
GLUCOSE, UA: NEGATIVE
KETONES UA: NEGATIVE
LEUKOCYTES UA: NEGATIVE
Nitrite, UA: NEGATIVE
RBC, UA: NEGATIVE
SPEC GRAV UA: 1.015 (ref 1.005–1.030)
Urobilinogen, Ur: 0.2 mg/dL (ref 0.2–1.0)
pH, UA: 7.5 (ref 5.0–7.5)

## 2016-01-06 MED ORDER — ATORVASTATIN CALCIUM 20 MG PO TABS: 20.0000 mg | ORAL_TABLET | Freq: Every day | ORAL | Status: DC

## 2016-01-06 MED ORDER — HYDROCHLOROTHIAZIDE 12.5 MG PO TABS: 12.5000 mg | ORAL_TABLET | Freq: Every day | ORAL | Status: DC

## 2016-01-06 MED ORDER — AMLODIPINE BESYLATE 10 MG PO TABS: 10.0000 mg | ORAL_TABLET | Freq: Every day | ORAL | Status: DC

## 2016-01-06 MED ORDER — CITALOPRAM HYDROBROMIDE 20 MG PO TABS: 20.0000 mg | ORAL_TABLET | Freq: Every day | ORAL | Status: DC

## 2016-01-06 MED ORDER — BENAZEPRIL HCL 40 MG PO TABS: 40.0000 mg | ORAL_TABLET | Freq: Every day | ORAL | Status: DC

## 2016-01-06 MED ORDER — PANTOPRAZOLE SODIUM 40 MG PO TBEC: 40.0000 mg | DELAYED_RELEASE_TABLET | Freq: Every day | ORAL | Status: DC

## 2016-01-06 MED ORDER — BUPROPION HCL ER (SR) 150 MG PO TB12: 150.0000 mg | ORAL_TABLET | Freq: Two times a day (BID) | ORAL | Status: DC

## 2016-01-06 NOTE — Assessment & Plan Note (Signed)
The current medical regimen is effective;  continue present plan and medications.  

## 2016-01-06 NOTE — Assessment & Plan Note (Signed)
Discuss Stretching care and treatment referral to physical therapy gentle use of meloxicam

## 2016-01-06 NOTE — Progress Notes (Signed)
BP 125/74 mmHg  Pulse 59  Temp(Src) 98.1 F (36.7 C)  Ht 5' 3.3" (1.608 m)  Wt 127 lb (57.607 kg)  BMI 22.28 kg/m2  SpO2 97%   Subjective:    Patient ID: Lindsey Blair, female    DOB: August 20, 1946, 70 y.o.   MRN: [id number]  HPI: Lindsey Blair is a 70 y.o. female  Chief Complaint  Patient presents with  . Annual Exam   Patient with complaints of left lateral leg IT bands left trochanteric bursa pain irritation starting after trying to get on a much taller horse palliatively has also fallen off a horse lately and some consequential aches and pains.  Protonix doing well for stomach flux issues resolved Blood pressure doing well no complaints  cholesterol doing well no complaints  Nerves doing well no complaints    Relevant past medical, surgical, family and social history reviewed and updated as indicated. Interim medical history since our last visit reviewed. Allergies and medications reviewed and updated.  Other than above Review of Systems  Constitutional: Negative.   HENT: Negative.   Eyes: Negative.   Respiratory: Negative.   Cardiovascular: Negative.   Gastrointestinal: Negative.   Endocrine: Negative.   Genitourinary: Negative.   Musculoskeletal: Negative.   Skin: Negative.   Allergic/Immunologic: Negative.   Neurological: Negative.   Hematological: Negative.   Psychiatric/Behavioral: Negative.     Per HPI unless specifically indicated above     Objective:    BP 125/74 mmHg  Pulse 59  Temp(Src) 98.1 F (36.7 C)  Ht 5' 3.3" (1.608 m)  Wt 127 lb (57.607 kg)  BMI 22.28 kg/m2  SpO2 97%  Wt Readings from Last 3 Encounters:  01/06/16 127 lb (57.607 kg)  10/22/15 129 lb (58.514 kg)  10/07/15 132 lb (59.875 kg)    Physical Exam  Constitutional: She is oriented to person, place, and time. She appears well-developed and well-nourished.  HENT:  Head: Normocephalic and atraumatic.  Right Ear: External ear normal.  Left Ear: External ear normal.   Nose: Nose normal.  Mouth/Throat: Oropharynx is clear and moist.  Eyes: Conjunctivae and EOM are normal. Pupils are equal, round, and reactive to light.  Neck: Normal range of motion. Neck supple. Carotid bruit is not present.  Cardiovascular: Normal rate, regular rhythm and normal heart sounds.   No murmur heard. Pulmonary/Chest: Effort normal and breath sounds normal. She exhibits no mass. Right breast exhibits no mass, no skin change and no tenderness. Left breast exhibits no mass, no skin change and no tenderness. Breasts are symmetrical.  Abdominal: Soft. Bowel sounds are normal. There is no hepatosplenomegaly.  Musculoskeletal: Normal range of motion.  Neurological: She is alert and oriented to person, place, and time.  Skin: No rash noted.  Psychiatric: She has a normal mood and affect. Her behavior is normal. Judgment and thought content normal.    Results for orders placed or performed in visit on [id number]  Basic metabolic panel  Result Value Ref Range   Glucose 106 (H) 65 - 99 mg/dL   BUN 12 8 - 27 mg/dL   Creatinine, Ser 1.01 (H) 0.57 - 1.00 mg/dL   GFR calc non Af Amer 57 (L) >59 mL/min/1.73   GFR calc Af Amer 65 >59 mL/min/1.73   BUN/Creatinine Ratio 12 11 - 26   Sodium 136 134 - 144 mmol/L   Potassium 4.1 3.5 - 5.2 mmol/L   Chloride 95 (L) 97 - 108 mmol/L   CO2 25 18 - 29  mmol/L   Calcium 9.4 8.7 - 10.3 mg/dL  LP+ALT+AST Piccolo, Waived  Result Value Ref Range   ALT (SGPT) Piccolo, Waived 21 10 - 47 U/L   AST (SGOT) Piccolo, Waived 24 11 - 38 U/L   Cholesterol Piccolo, Waived 165 <200 mg/dL   HDL Chol Piccolo, Waived 76 >59 mg/dL   Triglycerides Piccolo,Waived 130 <150 mg/dL   Chol/HDL Ratio Piccolo,Waive 2.2 mg/dL   LDL Chol Calc Piccolo Waived 63 <100 mg/dL   VLDL Chol Calc Piccolo,Waive 26 <30 mg/dL      Assessment & Plan:   Problem List Items Addressed This Visit      Cardiovascular and Mediastinum   Hypertension    The current medical regimen is  effective;  continue present plan and medications.       Relevant Medications   amLODipine (NORVASC) 10 MG tablet   atorvastatin (LIPITOR) 20 MG tablet   benazepril (LOTENSIN) 40 MG tablet   hydrochlorothiazide (HYDRODIURIL) 12.5 MG tablet     Digestive   Reflux esophagitis    The current medical regimen is effective;  continue present plan and medications.       Relevant Medications   pantoprazole (PROTONIX) 40 MG tablet     Musculoskeletal and Integument   IT band syndrome    Discuss Stretching care and treatment referral to physical therapy gentle use of meloxicam      Relevant Orders   Ambulatory referral to Physical Therapy   Cutaneous lupus erythematosus    Currently quiescent        Other   Hyperlipidemia    The current medical regimen is effective;  continue present plan and medications.       Relevant Medications   amLODipine (NORVASC) 10 MG tablet   atorvastatin (LIPITOR) 20 MG tablet   benazepril (LOTENSIN) 40 MG tablet   hydrochlorothiazide (HYDRODIURIL) 12.5 MG tablet   Depression    The current medical regimen is effective;  continue present plan and medications.       Relevant Medications   buPROPion (WELLBUTRIN SR) 150 MG 12 hr tablet   citalopram (CELEXA) 20 MG tablet    Other Visit Diagnoses    Routine general medical examination at a health care facility    -  Primary    Relevant Orders    CBC with Differential/Platelet    Comprehensive metabolic panel    Lipid Panel w/o Chol/HDL Ratio    TSH    Urinalysis, Routine w reflex microscopic (not at [facility])    Healthcare maintenance        Relevant Orders    Hepatitis C Antibody    Encounter for screening mammogram for breast cancer        Relevant Orders    MM Digital Screening        Follow up plan: Return in about 6 months (around 07/08/2016) for BMP, lipids, alt, ast.

## 2016-01-06 NOTE — Assessment & Plan Note (Signed)
Currently quiescent

## 2016-01-07 ENCOUNTER — Telehealth: Payer: Self-pay | Admitting: Family Medicine

## 2016-01-07 LAB — COMPREHENSIVE METABOLIC PANEL
A/G RATIO: 2 (ref 1.2–2.2)
ALT: 22 IU/L (ref 0–32)
AST: 24 IU/L (ref 0–40)
Albumin: 4.3 g/dL (ref 3.5–4.8)
Alkaline Phosphatase: 78 IU/L (ref 39–117)
BILIRUBIN TOTAL: 0.6 mg/dL (ref 0.0–1.2)
BUN/Creatinine Ratio: 12 (ref 12–28)
BUN: 13 mg/dL (ref 8–27)
CALCIUM: 9.6 mg/dL (ref 8.7–10.3)
CHLORIDE: 103 mmol/L (ref 96–106)
CO2: 23 mmol/L (ref 18–29)
Creatinine, Ser: 1.13 mg/dL — ABNORMAL HIGH (ref 0.57–1.00)
GFR calc Af Amer: 57 mL/min/{1.73_m2} — ABNORMAL LOW (ref >59)
GFR, EST NON AFRICAN AMERICAN: 49 mL/min/{1.73_m2} — AB (ref >59)
GLUCOSE: 78 mg/dL (ref 65–99)
Globulin, Total: 2.1 g/dL (ref 1.5–4.5)
POTASSIUM: 4.5 mmol/L (ref 3.5–5.2)
Sodium: 143 mmol/L (ref 134–144)
Total Protein: 6.4 g/dL (ref 6.0–8.5)

## 2016-01-07 LAB — CBC WITH DIFFERENTIAL/PLATELET
BASOS ABS: 0 10*3/uL (ref 0.0–0.2)
Basos: 1 %
EOS (ABSOLUTE): 0.1 10*3/uL (ref 0.0–0.4)
Eos: 3 %
HEMATOCRIT: 42.1 % (ref 34.0–46.6)
Hemoglobin: 14 g/dL (ref 11.1–15.9)
Immature Grans (Abs): 0 10*3/uL (ref 0.0–0.1)
Immature Granulocytes: 0 %
LYMPHS ABS: 1.5 10*3/uL (ref 0.7–3.1)
Lymphs: 34 %
MCH: 28.3 pg (ref 26.6–33.0)
MCHC: 33.3 g/dL (ref 31.5–35.7)
MCV: 85 fL (ref 79–97)
MONOS ABS: 0.4 10*3/uL (ref 0.1–0.9)
Monocytes: 10 %
Neutrophils Absolute: 2.2 10*3/uL (ref 1.4–7.0)
Neutrophils: 52 %
Platelets: 181 10*3/uL (ref 150–379)
RBC: 4.95 x10E6/uL (ref 3.77–5.28)
RDW: 12.2 % — AB (ref 12.3–15.4)
WBC: 4.3 10*3/uL (ref 3.4–10.8)

## 2016-01-07 LAB — LIPID PANEL W/O CHOL/HDL RATIO
Cholesterol, Total: 166 mg/dL (ref 100–199)
HDL: 74 mg/dL (ref >39)
LDL Calculated: 77 mg/dL (ref 0–99)
TRIGLYCERIDES: 76 mg/dL (ref 0–149)
VLDL Cholesterol Cal: 15 mg/dL (ref 5–40)

## 2016-01-07 LAB — TSH: TSH: 1.07 u[IU]/mL (ref 0.450–4.500)

## 2016-01-07 LAB — HEPATITIS C ANTIBODY: Hep C Virus Ab: <0.1 s/co ratio (ref 0.0–0.9)

## 2016-01-07 NOTE — Telephone Encounter (Signed)
Phone call Discussed with patient modest decline in renal function which is similar to 2011 and 2014 patient taking aspirin but no other nonsteroidal anti-inflammatory drugs. Patient will stop aspirin observe recheck BMP in the fall when she comes back

## 2016-01-11 DIAGNOSIS — M79652 Pain in left thigh: Secondary | ICD-10-CM | POA: Diagnosis not present

## 2016-01-13 DIAGNOSIS — M79652 Pain in left thigh: Secondary | ICD-10-CM | POA: Diagnosis not present

## 2016-01-19 DIAGNOSIS — M79652 Pain in left thigh: Secondary | ICD-10-CM | POA: Diagnosis not present

## 2016-01-25 DIAGNOSIS — M79652 Pain in left thigh: Secondary | ICD-10-CM | POA: Diagnosis not present

## 2016-01-27 ENCOUNTER — Ambulatory Visit
Admission: RE | Admit: 2016-01-27 | Discharge: 2016-01-27 | Disposition: A | Discharge status: Home / Self Care | Payer: PPO | Source: Ambulatory Visit | Attending: Family Medicine | Admitting: Family Medicine

## 2016-01-27 DIAGNOSIS — Z1231 Encounter for screening mammogram for malignant neoplasm of breast: Secondary | ICD-10-CM | POA: Diagnosis not present

## 2016-01-27 HISTORY — DX: Malignant (primary) neoplasm, unspecified: C80.1

## 2016-05-20 DIAGNOSIS — H2511 Age-related nuclear cataract, right eye: Secondary | ICD-10-CM | POA: Diagnosis not present

## 2016-06-20 DIAGNOSIS — R809 Proteinuria, unspecified: Secondary | ICD-10-CM | POA: Diagnosis not present

## 2016-06-20 DIAGNOSIS — M329 Systemic lupus erythematosus, unspecified: Secondary | ICD-10-CM | POA: Diagnosis not present

## 2016-06-20 DIAGNOSIS — R319 Hematuria, unspecified: Secondary | ICD-10-CM | POA: Diagnosis not present

## 2016-06-20 DIAGNOSIS — I10 Essential (primary) hypertension: Secondary | ICD-10-CM | POA: Diagnosis not present

## 2016-06-20 LAB — HM COLONOSCOPY

## 2016-07-04 DIAGNOSIS — N183 Chronic kidney disease, stage 3 (moderate): Secondary | ICD-10-CM | POA: Diagnosis not present

## 2016-07-04 DIAGNOSIS — L931 Subacute cutaneous lupus erythematosus: Secondary | ICD-10-CM | POA: Diagnosis not present

## 2016-07-04 DIAGNOSIS — I10 Essential (primary) hypertension: Secondary | ICD-10-CM | POA: Diagnosis not present

## 2016-07-04 DIAGNOSIS — M329 Systemic lupus erythematosus, unspecified: Secondary | ICD-10-CM | POA: Diagnosis not present

## 2016-07-12 ENCOUNTER — Ambulatory Visit: Payer: PPO | Admitting: Family Medicine

## 2016-07-20 ENCOUNTER — Encounter: Payer: Self-pay | Admitting: Family Medicine

## 2016-07-20 ENCOUNTER — Ambulatory Visit (INDEPENDENT_AMBULATORY_CARE_PROVIDER_SITE_OTHER): Payer: PPO | Admitting: Family Medicine

## 2016-07-20 VITALS — BP 136/72 | HR 70 | Temp 98.6°F | Wt 130.3 lb

## 2016-07-20 DIAGNOSIS — E7800 Pure hypercholesterolemia, unspecified: Secondary | ICD-10-CM | POA: Diagnosis not present

## 2016-07-20 DIAGNOSIS — F3342 Major depressive disorder, recurrent, in full remission: Secondary | ICD-10-CM

## 2016-07-20 DIAGNOSIS — I10 Essential (primary) hypertension: Secondary | ICD-10-CM | POA: Diagnosis not present

## 2016-07-20 DIAGNOSIS — N952 Postmenopausal atrophic vaginitis: Secondary | ICD-10-CM | POA: Diagnosis not present

## 2016-07-20 LAB — LP+ALT+AST PICCOLO, WAIVED
ALT (SGPT) PICCOLO, WAIVED: 19 U/L (ref 10–47)
AST (SGOT) PICCOLO, WAIVED: 22 U/L (ref 11–38)
CHOLESTEROL PICCOLO, WAIVED: 176 mg/dL (ref <200)
Chol/HDL Ratio Piccolo,Waive: 2.6 mg/dL
HDL Chol Piccolo, Waived: 68 mg/dL (ref >59)
LDL Chol Calc Piccolo Waived: 74 mg/dL (ref <100)
Triglycerides Piccolo,Waived: 175 mg/dL — ABNORMAL HIGH (ref <150)
VLDL Chol Calc Piccolo,Waive: 35 mg/dL — ABNORMAL HIGH (ref <30)

## 2016-07-20 MED ORDER — ESTRADIOL 0.1 MG/GM VA CREA: 1.0000 | TOPICAL_CREAM | VAGINAL | 12 refills | Status: DC

## 2016-07-20 NOTE — Progress Notes (Signed)
BP 136/72   Pulse 70   Temp 98.6 F (37 C)   Wt 130 lb 4.8 oz (59.1 kg)   SpO2 99%   BMI 22.86 kg/m    Subjective:    Patient ID: Lindsey Blair, female    DOB: [birth year], 70 y.o.   MRN: [id number]  HPI: Lindsey Blair is a 70 y.o. female  Chief Complaint  Patient presents with  . Hyperlipidemia  . Hypertension  . Depression   Patient follow-up cholesterol blood pressure and depression all in all doing well. Got fed up with medications and tried to stop blood pressure promptly went up has restarted amlodipine and Benzapril 20 mg blood pressures doing okay. Was able to stop hydrochlorothiazide. Cholesterols back on Lipitor 20 mg with no problems no side effects Was able to stop citalopram and maintain her nerves steadily over this fall doing fine with bupropion and then trazodone for sleep. Patient having some jitteriness may be from Wellbutrin is interested in maybe a change back to citalopram Relevant past medical, surgical, family and social history reviewed and updated as indicated. Interim medical history since our last visit reviewed. Allergies and medications reviewed and updated.  Review of Systems  Constitutional: Negative.   Respiratory: Negative.   Cardiovascular: Negative.     Per HPI unless specifically indicated above     Objective:    BP 136/72   Pulse 70   Temp 98.6 F (37 C)   Wt 130 lb 4.8 oz (59.1 kg)   SpO2 99%   BMI 22.86 kg/m   Wt Readings from Last 3 Encounters:  07/20/16 130 lb 4.8 oz (59.1 kg)  01/06/16 127 lb (57.6 kg)  10/22/15 129 lb (58.5 kg)    Physical Exam  Constitutional: She is oriented to person, place, and time. She appears well-developed and well-nourished. No distress.  HENT:  Head: Normocephalic and atraumatic.  Right Ear: Hearing normal.  Left Ear: Hearing normal.  Nose: Nose normal.  Eyes: Conjunctivae and lids are normal. Right eye exhibits no discharge. Left eye exhibits no discharge. No scleral icterus.    Pulmonary/Chest: Effort normal. No respiratory distress.  Musculoskeletal: Normal range of motion.  Neurological: She is alert and oriented to person, place, and time.  Skin: Skin is intact. No rash noted.  Psychiatric: She has a normal mood and affect. Her speech is normal and behavior is normal. Judgment and thought content normal. Cognition and memory are normal.    Results for orders placed or performed in visit on 07/20/16  HM COLONOSCOPY  Result Value Ref Range   HM Colonoscopy See Report (in chart) See Report (in chart), Patient Reported      Assessment & Plan:   Problem List Items Addressed This Visit      Cardiovascular and Mediastinum   Hypertension    The current medical regimen is effective;  continue present plan and medications.       Relevant Orders   Basic metabolic panel     Genitourinary   Atrophic vaginitis    Trial of estrace vag cream        Other   Hyperlipidemia - Primary    The current medical regimen is effective;  continue present plan and medications.       Relevant Orders   LP+ALT+AST Piccolo, Waived   Depression    After discussion we will decrease Wellbutrin to 1 at bedtime for a week then discontinue*citalopram 20 mg 1 a day observe depression symptoms  Relevant Medications   traZODone (DESYREL) 50 MG tablet       Follow up plan: Return in about 6 months (around 01/17/2017) for Physical Exam.

## 2016-07-20 NOTE — Assessment & Plan Note (Signed)
The current medical regimen is effective;  continue present plan and medications.  

## 2016-07-20 NOTE — Assessment & Plan Note (Signed)
Trial of estrace vag cream

## 2016-07-20 NOTE — Assessment & Plan Note (Signed)
After discussion we will decrease Wellbutrin to 1 at bedtime for a week then discontinue*citalopram 20 mg 1 a day observe depression symptoms

## 2016-07-21 ENCOUNTER — Encounter: Payer: Self-pay | Admitting: Family Medicine

## 2016-07-21 LAB — BASIC METABOLIC PANEL
BUN / CREAT RATIO: 12 (ref 12–28)
BUN: 12 mg/dL (ref 8–27)
CALCIUM: 9.3 mg/dL (ref 8.7–10.3)
CHLORIDE: 94 mmol/L — AB (ref 96–106)
CO2: 25 mmol/L (ref 18–29)
Creatinine, Ser: 0.97 mg/dL (ref 0.57–1.00)
GFR calc non Af Amer: 59 mL/min/{1.73_m2} — ABNORMAL LOW (ref >59)
GFR, EST AFRICAN AMERICAN: 68 mL/min/{1.73_m2} (ref >59)
Glucose: 87 mg/dL (ref 65–99)
POTASSIUM: 3.6 mmol/L (ref 3.5–5.2)
Sodium: 135 mmol/L (ref 134–144)

## 2016-09-06 DIAGNOSIS — Z85828 Personal history of other malignant neoplasm of skin: Secondary | ICD-10-CM | POA: Diagnosis not present

## 2016-09-06 DIAGNOSIS — L931 Subacute cutaneous lupus erythematosus: Secondary | ICD-10-CM | POA: Diagnosis not present

## 2016-09-06 DIAGNOSIS — D225 Melanocytic nevi of trunk: Secondary | ICD-10-CM | POA: Diagnosis not present

## 2016-09-06 DIAGNOSIS — D2271 Melanocytic nevi of right lower limb, including hip: Secondary | ICD-10-CM | POA: Diagnosis not present

## 2016-09-06 DIAGNOSIS — L570 Actinic keratosis: Secondary | ICD-10-CM | POA: Diagnosis not present

## 2016-09-06 DIAGNOSIS — X32XXXA Exposure to sunlight, initial encounter: Secondary | ICD-10-CM | POA: Diagnosis not present

## 2016-09-20 DIAGNOSIS — L931 Subacute cutaneous lupus erythematosus: Secondary | ICD-10-CM | POA: Diagnosis not present

## 2016-12-20 DIAGNOSIS — L931 Subacute cutaneous lupus erythematosus: Secondary | ICD-10-CM | POA: Diagnosis not present

## 2016-12-21 DIAGNOSIS — N183 Chronic kidney disease, stage 3 (moderate): Secondary | ICD-10-CM | POA: Diagnosis not present

## 2016-12-21 DIAGNOSIS — M329 Systemic lupus erythematosus, unspecified: Secondary | ICD-10-CM | POA: Diagnosis not present

## 2016-12-21 DIAGNOSIS — I129 Hypertensive chronic kidney disease with stage 1 through stage 4 chronic kidney disease, or unspecified chronic kidney disease: Secondary | ICD-10-CM | POA: Diagnosis not present

## 2017-01-09 ENCOUNTER — Other Ambulatory Visit: Payer: Self-pay | Admitting: Family Medicine

## 2017-01-11 DIAGNOSIS — Z5181 Encounter for therapeutic drug level monitoring: Secondary | ICD-10-CM | POA: Diagnosis not present

## 2017-01-11 DIAGNOSIS — L931 Subacute cutaneous lupus erythematosus: Secondary | ICD-10-CM | POA: Diagnosis not present

## 2017-01-25 ENCOUNTER — Other Ambulatory Visit: Payer: Self-pay | Admitting: Family Medicine

## 2017-01-25 DIAGNOSIS — Z1231 Encounter for screening mammogram for malignant neoplasm of breast: Secondary | ICD-10-CM

## 2017-02-02 ENCOUNTER — Telehealth: Payer: Self-pay | Admitting: Family Medicine

## 2017-02-02 NOTE — Telephone Encounter (Signed)
Spoke with Dr. Jeananne Rama and he verbalized our policy that we no longer write [employer]. Left message on machine for pt to return call to the office.

## 2017-02-02 NOTE — Telephone Encounter (Signed)
Left message on machine for pt to return call to the office.  

## 2017-02-02 NOTE — Telephone Encounter (Signed)
Pt made aware. Pt would like a referral for low libido. Will pt need OV for referral, where should pt be referred?

## 2017-02-02 NOTE — Telephone Encounter (Signed)
Patient would like a prescription of testosterone cream sent to her house. Patient informed that prescriptions protocol and process. Patient is aware that prescriptions are usually picked up at pharmacy or office but would still like to speak with CMA regarding medication.   Please Advise.   Thank you

## 2017-02-09 ENCOUNTER — Ambulatory Visit
Admission: RE | Admit: 2017-02-09 | Discharge: 2017-02-09 | Disposition: A | Discharge status: Home / Self Care | Payer: PPO | Source: Ambulatory Visit | Attending: Family Medicine | Admitting: Family Medicine

## 2017-02-09 DIAGNOSIS — Z1231 Encounter for screening mammogram for malignant neoplasm of breast: Secondary | ICD-10-CM

## 2017-02-15 NOTE — Telephone Encounter (Signed)
Provider spoke with patient

## 2017-02-17 ENCOUNTER — Ambulatory Visit (INDEPENDENT_AMBULATORY_CARE_PROVIDER_SITE_OTHER): Payer: PPO

## 2017-02-17 VITALS — BP 112/64 | HR 62 | Temp 98.0°F | Resp 16 | Ht 63.0 in | Wt 129.1 lb

## 2017-02-17 DIAGNOSIS — Z13820 Encounter for screening for osteoporosis: Secondary | ICD-10-CM | POA: Diagnosis not present

## 2017-02-17 DIAGNOSIS — Z0000 Encounter for general adult medical examination without abnormal findings: Secondary | ICD-10-CM | POA: Diagnosis not present

## 2017-02-17 NOTE — Progress Notes (Signed)
Subjective:   Lindsey Blair is a 71 y.o. female who presents for Medicare Annual (Subsequent) preventive examination.  Review of Systems:  Cardiac Risk Factors include: advanced age (>37men, >61 women);dyslipidemia;hypertension     Objective:     Vitals: BP 112/64 (BP Location: Left Arm, Patient Position: Sitting)   Pulse 62   Temp 98 F (36.7 C)   Resp 16   Ht 5\' 3"  (1.6 m)   Wt 129 lb 1.6 oz (58.6 kg)   BMI 22.87 kg/m   Body mass index is 22.87 kg/m.   Tobacco History  Smoking Status  . Former Smoker  . Types: Cigarettes  . Quit date: 03/08/1965  Smokeless Tobacco  . Never Used     Counseling given: Not Answered   Past Medical History:  Diagnosis Date  . Anxiety   . Cancer [facility]) Several instances of the years   Skin  . Chronic kidney disease   . Cutaneous lupus erythematosus   . Depression   . Hyperlipidemia   . Hypertension   . Vertigo    rare   Past Surgical History:  Procedure Laterality Date  . BREAST BIOPSY Right 2000   Negative  . COLONOSCOPY WITH PROPOFOL N/A 10/22/2015   Procedure: COLONOSCOPY WITH PROPOFOL;  Surgeon: Lucilla Lame, MD;  Location: [facility];  Service: Endoscopy;  Laterality: N/A;  . ESOPHAGOGASTRODUODENOSCOPY (EGD) WITH PROPOFOL N/A 10/22/2015   Procedure: ESOPHAGOGASTRODUODENOSCOPY (EGD) WITH PROPOFOL;  Surgeon: Lucilla Lame, MD;  Location: [facility];  Service: Endoscopy;  Laterality: N/A;  . IMAGE GUIDED SINUS SURGERY  04/06/11   [facility], Dr. Pryor Ochoa  . MASTOIDECTOMY     Family History  Problem Relation Age of Onset  . Heart disease Mother   . Leukemia Father   . Stroke Father   . Cerebral aneurysm Sister   . Prostate cancer Brother   . Prostate cancer Brother   . Prostate cancer Brother   . Lung cancer Sister   . Bladder Cancer Sister   . Liver cancer Sister   . Heart attack Sister   . Breast cancer Neg Hx    History  Sexual Activity  . Sexual activity: Not on file    Outpatient Encounter  Prescriptions as of 02/17/2017  Medication Sig  . amLODipine (NORVASC) 10 MG tablet Take 1 tablet (10 mg total) by mouth daily.  Marland Kitchen atorvastatin (LIPITOR) 20 MG tablet Take 1 tablet (20 mg total) by mouth daily.  . benazepril (LOTENSIN) 40 MG tablet Take 1 tablet (40 mg total) by mouth daily. (Patient taking differently: Take 40 mg by mouth daily. Takes 1/2 pill daily)  . meloxicam (MOBIC) 15 MG tablet   . traZODone (DESYREL) 50 MG tablet TAKE ONE TABLET AT BED- TIME IF NEEDED FOR SLEEP.  . [DISCONTINUED] estradiol (ESTRACE VAGINAL) 0.1 MG/GM vaginal cream Place 1 Applicatorful vaginally 3 (three) times a week. Small amount (Patient not taking: Reported on 02/17/2017)   No facility-administered encounter medications on file as of 02/17/2017.     Activities of Daily Living In your present state of health, do you have any difficulty performing the following activities: 02/17/2017 07/20/2016  Hearing? Y N  Vision? N N  Difficulty concentrating or making decisions? N N  Walking or climbing stairs? N N  Dressing or bathing? N N  Doing errands, shopping? N N  Preparing Food and eating ? N -  Using the Toilet? N -  In the past six months, have you accidently leaked urine? N -  Do you have problems with loss of bowel control? N -  Managing your Medications? N -  Managing your Finances? N -  Housekeeping or managing your Housekeeping? N -  Some recent data might be hidden    Patient Care Team: Guadalupe Maple, MD as PCP - General (Family Medicine) Lucilla Lame, MD as Consulting Physician (Gastroenterology) Lavonia Dana, MD as Consulting Physician (Internal Medicine) Emmaline Kluver., MD (Rheumatology)    Assessment:     Exercise Activities and Dietary recommendations Current Exercise Habits: The patient has a physically strenous job, but has no regular exercise apart from work. (does streneous activities at home )  Goals    . Increase water intake          Recommend drinking  at least 3-4 glasses of water a day       Fall Risk Fall Risk  02/17/2017 01/06/2016 04/27/2015  Falls in the past year? No No No   Depression Screen PHQ 2/9 Scores 02/17/2017 07/20/2016 01/06/2016 04/27/2015  PHQ - 2 Score 0 0 0 2  PHQ- 9 Score - 2 - -     Cognitive Function     6CIT Screen 02/17/2017  What Year? 0 points  What month? 0 points  What time? 0 points  Count back from 20 0 points  Months in reverse 0 points  Repeat phrase 0 points  Total Score 0    Immunization History  Administered Date(s) Administered  . Influenza-Unspecified 06/11/2015, 06/24/2016  . Pneumococcal Conjugate-13 04/09/2014  . Pneumococcal-Unspecified 10/03/2011  . Td 08/25/2005, 10/08/2015  . Zoster 08/27/2008   Screening Tests Health Maintenance  Topic Date Due  . INFLUENZA VACCINE  04/05/2017  . MAMMOGRAM  02/10/2019  . TETANUS/TDAP  10/07/2025  . COLONOSCOPY  06/20/2026  . DEXA SCAN  Completed  . Hepatitis C Screening  Completed  . PNA vac Low Risk Adult  Completed      Plan:  I have personally reviewed and addressed the Medicare Annual Wellness questionnaire and have noted the following in the patient's chart:  A. Medical and social history B. Use of alcohol, tobacco or illicit drugs  C. Current medications and supplements D. Functional ability and status E.  Nutritional status F.  Physical activity G. Advance directives H. List of other physicians I.  Hospitalizations, surgeries, and ER visits in previous 12 months J.  [place] such as hearing and vision if needed, cognitive and depression L. Referrals and appointments   In addition, I have reviewed and discussed with patient certain preventive protocols, quality metrics, and best practice recommendations. A written personalized care plan for preventive services as well as general preventive health recommendations were provided to patient.   Signed,  Tyler Aas, LPN Nurse Health Advisor   MD  Recommendations:  I, as supervising physician, have reviewed the nurse health advisors Medicare wellness visit note for this patient and concur with the findings and recommendations listed above. Golden Pop M.D.

## 2017-02-17 NOTE — Patient Instructions (Signed)
Lindsey Blair , Thank you for taking time to come for your Medicare Wellness Visit. I appreciate your ongoing commitment to your health goals. Please review the following plan we discussed and let me know if I can assist you in the future.   Screening recommendations/referrals: Colonoscopy: completed 06/20/2016 Mammogram: completed 02/09/2017 Bone Density: completed 11/26/2013 Recommended yearly ophthalmology/optometry visit for glaucoma screening and checkup Recommended yearly dental visit for hygiene and checkup  Vaccinations: Influenza vaccine: up to date, due 06/2017 Pneumococcal vaccine: completed series Tdap vaccine: up to date Shingles vaccine: up to date   Advanced directives: Advance directive discussed with you today. I have provided a copy for you to complete at home and have notarized. Once this is complete please bring a copy in to our office so we can scan it into your chart.  Conditions/risks identified: Recommend drinking at least 3-4 glasses of water a day   Next appointment: Follow up on 03/06/2017 at 10:00am with Dr.Crissman. Follow up in one year for your annual wellness exam.    Preventive Care 72 Years and Older, Female Preventive care refers to lifestyle choices and visits with your health care provider that can promote health and wellness. What does preventive care include?  A yearly physical exam. This is also called an annual well check.  Dental exams once or twice a year.  Routine eye exams. Ask your health care provider how often you should have your eyes checked.  Personal lifestyle choices, including:  Daily care of your teeth and gums.  Regular physical activity.  Eating a healthy diet.  Avoiding tobacco and drug use.  Limiting alcohol use.  Practicing safe sex.  Taking low-dose aspirin every day.  Taking vitamin and mineral supplements as recommended by your health care provider. What happens during an annual well check? The services and  screenings done by your health care provider during your annual well check will depend on your age, overall health, lifestyle risk factors, and family history of disease. Counseling  Your health care provider may ask you questions about your:  Alcohol use.  Tobacco use.  Drug use.  Emotional well-being.  Home and relationship well-being.  Sexual activity.  Eating habits.  History of falls.  Memory and ability to understand (cognition).  Work and work Statistician.  Reproductive health. Screening  You may have the following tests or measurements:  Height, weight, and BMI.  Blood pressure.  Lipid and cholesterol levels. These may be checked every 5 years, or more frequently if you are over 25 years old.  Skin check.  Lung cancer screening. You may have this screening every year starting at age 36 if you have a 30-pack-year history of smoking and currently smoke or have quit within the past 15 years.  Fecal occult blood test (FOBT) of the stool. You may have this test every year starting at age 104.  Flexible sigmoidoscopy or colonoscopy. You may have a sigmoidoscopy every 5 years or a colonoscopy every 10 years starting at age 59.  Hepatitis C blood test.  Hepatitis B blood test.  Sexually transmitted disease (STD) testing.  Diabetes screening. This is done by checking your blood sugar (glucose) after you have not eaten for a while (fasting). You may have this done every 1-3 years.  Bone density scan. This is done to screen for osteoporosis. You may have this done starting at age 78.  Mammogram. This may be done every 1-2 years. Talk to your health care provider about how often you should  have regular mammograms. Talk with your health care provider about your test results, treatment options, and if necessary, the need for more tests. Vaccines  Your health care provider may recommend certain vaccines, such as:  Influenza vaccine. This is recommended every  year.  Tetanus, diphtheria, and acellular pertussis (Tdap, Td) vaccine. You may need a Td booster every 10 years.  Zoster vaccine. You may need this after age 64.  Pneumococcal 13-valent conjugate (PCV13) vaccine. One dose is recommended after age 7.  Pneumococcal polysaccharide (PPSV23) vaccine. One dose is recommended after age 67. Talk to your health care provider about which screenings and vaccines you need and how often you need them. This information is not intended to replace advice given to you by your health care provider. Make sure you discuss any questions you have with your health care provider. Document Released: 09/18/2015 Document Revised: 05/11/2016 Document Reviewed: 06/23/2015 Elsevier Interactive Patient Education  2017 [place] Prevention in the Home Falls can cause injuries. They can happen to people of all ages. There are many things you can do to make your home safe and to help prevent falls. What can I do on the outside of my home?  Regularly fix the edges of walkways and driveways and fix any cracks.  Remove anything that might make you trip as you walk through a door, such as a raised step or threshold.  Trim any bushes or trees on the path to your home.  Use bright outdoor lighting.  Clear any walking paths of anything that might make someone trip, such as rocks or tools.  Regularly check to see if handrails are loose or broken. Make sure that both sides of any steps have handrails.  Any raised decks and porches should have guardrails on the edges.  Have any leaves, snow, or ice cleared regularly.  Use sand or salt on walking paths during winter.  Clean up any spills in your garage right away. This includes oil or grease spills. What can I do in the bathroom?  Use night lights.  Install grab bars by the toilet and in the tub and shower. Do not use towel bars as grab bars.  Use non-skid mats or decals in the tub or shower.  If you  need to sit down in the shower, use a plastic, non-slip stool.  Keep the floor dry. Clean up any water that spills on the floor as soon as it happens.  Remove soap buildup in the tub or shower regularly.  Attach bath mats securely with double-sided non-slip rug tape.  Do not have throw rugs and other things on the floor that can make you trip. What can I do in the bedroom?  Use night lights.  Make sure that you have a light by your bed that is easy to reach.  Do not use any sheets or blankets that are too big for your bed. They should not hang down onto the floor.  Have a firm chair that has side arms. You can use this for support while you get dressed.  Do not have throw rugs and other things on the floor that can make you trip. What can I do in the kitchen?  Clean up any spills right away.  Avoid walking on wet floors.  Keep items that you use a lot in easy-to-reach places.  If you need to reach something above you, use a strong step stool that has a grab bar.  Keep electrical cords out of  the way.  Do not use floor polish or wax that makes floors slippery. If you must use wax, use non-skid floor wax.  Do not have throw rugs and other things on the floor that can make you trip. What can I do with my stairs?  Do not leave any items on the stairs.  Make sure that there are handrails on both sides of the stairs and use them. Fix handrails that are broken or loose. Make sure that handrails are as long as the stairways.  Check any carpeting to make sure that it is firmly attached to the stairs. Fix any carpet that is loose or worn.  Avoid having throw rugs at the top or bottom of the stairs. If you do have throw rugs, attach them to the floor with carpet tape.  Make sure that you have a light switch at the top of the stairs and the bottom of the stairs. If you do not have them, ask someone to add them for you. What else can I do to help prevent falls?  Wear shoes  that:  Do not have high heels.  Have rubber bottoms.  Are comfortable and fit you well.  Are closed at the toe. Do not wear sandals.  If you use a stepladder:  Make sure that it is fully opened. Do not climb a closed stepladder.  Make sure that both sides of the stepladder are locked into place.  Ask someone to hold it for you, if possible.  Clearly mark and make sure that you can see:  Any grab bars or handrails.  First and last steps.  Where the edge of each step is.  Use tools that help you move around (mobility aids) if they are needed. These include:  Canes.  Walkers.  Scooters.  Crutches.  Turn on the lights when you go into a dark area. Replace any light bulbs as soon as they burn out.  Set up your furniture so you have a clear path. Avoid moving your furniture around.  If any of your floors are uneven, fix them.  If there are any pets around you, be aware of where they are.  Review your medicines with your doctor. Some medicines can make you feel dizzy. This can increase your chance of falling. Ask your doctor what other things that you can do to help prevent falls. This information is not intended to replace advice given to you by your health care provider. Make sure you discuss any questions you have with your health care provider. Document Released: 06/18/2009 Document Revised: 01/28/2016 Document Reviewed: 09/26/2014 Elsevier Interactive Patient Education  2017 [employer].

## 2017-03-06 ENCOUNTER — Encounter: Payer: Self-pay | Admitting: Family Medicine

## 2017-03-06 ENCOUNTER — Ambulatory Visit (INDEPENDENT_AMBULATORY_CARE_PROVIDER_SITE_OTHER): Payer: PPO | Admitting: Family Medicine

## 2017-03-06 VITALS — BP 128/75 | HR 61 | Ht 63.0 in | Wt 127.0 lb

## 2017-03-06 DIAGNOSIS — Z1329 Encounter for screening for other suspected endocrine disorder: Secondary | ICD-10-CM | POA: Diagnosis not present

## 2017-03-06 DIAGNOSIS — E7800 Pure hypercholesterolemia, unspecified: Secondary | ICD-10-CM

## 2017-03-06 DIAGNOSIS — F3342 Major depressive disorder, recurrent, in full remission: Secondary | ICD-10-CM

## 2017-03-06 DIAGNOSIS — Z0000 Encounter for general adult medical examination without abnormal findings: Secondary | ICD-10-CM | POA: Diagnosis not present

## 2017-03-06 DIAGNOSIS — I10 Essential (primary) hypertension: Secondary | ICD-10-CM | POA: Diagnosis not present

## 2017-03-06 DIAGNOSIS — Z131 Encounter for screening for diabetes mellitus: Secondary | ICD-10-CM | POA: Diagnosis not present

## 2017-03-06 DIAGNOSIS — N952 Postmenopausal atrophic vaginitis: Secondary | ICD-10-CM

## 2017-03-06 DIAGNOSIS — Z7189 Other specified counseling: Secondary | ICD-10-CM | POA: Diagnosis not present

## 2017-03-06 DIAGNOSIS — G4700 Insomnia, unspecified: Secondary | ICD-10-CM | POA: Diagnosis not present

## 2017-03-06 LAB — MICROSCOPIC EXAMINATION
BACTERIA UA: NONE SEEN
RBC MICROSCOPIC, UA: NONE SEEN /HPF (ref 0–?)
WBC UA: NONE SEEN /HPF (ref 0–?)

## 2017-03-06 LAB — URINALYSIS, ROUTINE W REFLEX MICROSCOPIC
Bilirubin, UA: NEGATIVE
GLUCOSE, UA: NEGATIVE
Ketones, UA: NEGATIVE
Leukocytes, UA: NEGATIVE
NITRITE UA: NEGATIVE
PH UA: 7 (ref 5.0–7.5)
Protein, UA: NEGATIVE
RBC, UA: NEGATIVE
Specific Gravity, UA: 1.01 (ref 1.005–1.030)
UUROB: 0.2 mg/dL (ref 0.2–1.0)

## 2017-03-06 MED ORDER — ATORVASTATIN CALCIUM 20 MG PO TABS: 20.0000 mg | ORAL_TABLET | Freq: Every day | ORAL | 4 refills | Status: DC

## 2017-03-06 MED ORDER — FLUOXETINE HCL 10 MG PO CAPS: 10.0000 mg | ORAL_CAPSULE | Freq: Every day | ORAL | 3 refills | Status: DC

## 2017-03-06 MED ORDER — BENAZEPRIL HCL 20 MG PO TABS: 20.0000 mg | ORAL_TABLET | Freq: Every day | ORAL | 4 refills | Status: DC

## 2017-03-06 MED ORDER — AMLODIPINE BESYLATE 10 MG PO TABS: 10.0000 mg | ORAL_TABLET | Freq: Every day | ORAL | 4 refills | Status: DC

## 2017-03-06 MED ORDER — TRAZODONE HCL 50 MG PO TABS: 50.0000 mg | ORAL_TABLET | Freq: Every day | ORAL | 4 refills | Status: DC

## 2017-03-06 NOTE — Assessment & Plan Note (Signed)
Patient's been taking Benzapril only 20 mg a day with good control will continue 20 mg

## 2017-03-06 NOTE — Assessment & Plan Note (Signed)
Discuss insomnia patient doing well with trazodone continue using trazodone

## 2017-03-06 NOTE — Assessment & Plan Note (Signed)
Discussed medication may be causing some of her leg myalgias will do a trial of stopping for 2 weeks observing symptoms restarting and observing symptoms.

## 2017-03-06 NOTE — Progress Notes (Signed)
BP 128/75   Pulse 61   Ht 5\' 3"  (1.6 m)   Wt 127 lb (57.6 kg)   SpO2 99%   BMI 22.50 kg/m    Subjective:    Patient ID: Lindsey Blair, female    DOB: 06-02-1946, 71 y.o.   MRN: [id number]  HPI: Lindsey Blair is a 71 y.o. female  Annual exam And check up Patient with multiple concerns. Patient's bothered by leg symptoms with change over to citalopram. These leg symptoms heaviness cramping type sensation developed almost immediately with starting citalopram. Stop the citalopram leg symptoms have largely improved but still has some residual symptoms. Trouble is citalopram worked really well for depression and nerves much better than bupropion and felt much better on citalopram been on bupropion except for the side effects.  Sleep has been ongoing continuing problem trazodone works well without side effects and is taking almost every night is concerned this may not be appropriate to take every night  Discussed cholesterol medicine Lipitor may also be contributing to leg symptoms. Patient has not tried stopping and restarting and observing symptoms yet.  Blood pressure doing well no complaints.  Patient's had estrogen testosterone cream for atrophic vaginitis and libido concerns wants to try again is been several years without any.   Relevant past medical, surgical, family and social history reviewed and updated as indicated. Interim medical history since our last visit reviewed. Allergies and medications reviewed and updated.  Review of Systems  Constitutional: Negative.   HENT: Negative.   Eyes: Negative.   Respiratory: Negative.   Cardiovascular: Negative.   Gastrointestinal: Negative.   Endocrine: Negative.   Genitourinary: Negative.   Musculoskeletal: Negative.   Skin: Negative.   Allergic/Immunologic: Negative.   Neurological: Negative.   Hematological: Negative.   Psychiatric/Behavioral: Negative.     Per HPI unless specifically indicated above       Objective:    BP 128/75   Pulse 61   Ht 5\' 3"  (1.6 m)   Wt 127 lb (57.6 kg)   SpO2 99%   BMI 22.50 kg/m   Wt Readings from Last 3 Encounters:  03/06/17 127 lb (57.6 kg)  02/17/17 129 lb 1.6 oz (58.6 kg)  07/20/16 130 lb 4.8 oz (59.1 kg)    Physical Exam  Constitutional: She is oriented to person, place, and time. She appears well-developed and well-nourished.  HENT:  Head: Normocephalic and atraumatic.  Right Ear: External ear normal.  Left Ear: External ear normal.  Nose: Nose normal.  Mouth/Throat: Oropharynx is clear and moist.  Eyes: Conjunctivae and EOM are normal. Pupils are equal, round, and reactive to light.  Neck: Normal range of motion. Neck supple. Carotid bruit is not present.  Cardiovascular: Normal rate, regular rhythm and normal heart sounds.   No murmur heard. Pulmonary/Chest: Effort normal and breath sounds normal. She exhibits no mass. Right breast exhibits no mass, no skin change and no tenderness. Left breast exhibits no mass, no skin change and no tenderness. Breasts are symmetrical.  Abdominal: Soft. Bowel sounds are normal. There is no hepatosplenomegaly.  Musculoskeletal: Normal range of motion.  Neurological: She is alert and oriented to person, place, and time.  Skin: No rash noted.  Psychiatric: She has a normal mood and affect. Her behavior is normal. Judgment and thought content normal.    Results for orders placed or performed in visit on 15/40/08  Basic metabolic panel  Result Value Ref Range   Glucose 87 65 - 99 mg/dL  BUN 12 8 - 27 mg/dL   Creatinine, Ser 0.97 0.57 - 1.00 mg/dL   GFR calc non Af Amer 59 (L) >59 mL/min/1.73   GFR calc Af Amer 68 >59 mL/min/1.73   BUN/Creatinine Ratio 12 12 - 28   Sodium 135 134 - 144 mmol/L   Potassium 3.6 3.5 - 5.2 mmol/L   Chloride 94 (L) 96 - 106 mmol/L   CO2 25 18 - 29 mmol/L   Calcium 9.3 8.7 - 10.3 mg/dL  LP+ALT+AST Piccolo, Waived  Result Value Ref Range   ALT (SGPT) Piccolo, Waived 19 10 -  47 U/L   AST (SGOT) Piccolo, Waived 22 11 - 38 U/L   Cholesterol Piccolo, Waived 176 <200 mg/dL   HDL Chol Piccolo, Waived 68 >59 mg/dL   Triglycerides Piccolo,Waived 175 (H) <150 mg/dL   Chol/HDL Ratio Piccolo,Waive 2.6 mg/dL   LDL Chol Calc Piccolo Waived 74 <100 mg/dL   VLDL Chol Calc Piccolo,Waive 35 (H) <30 mg/dL  HM COLONOSCOPY  Result Value Ref Range   HM Colonoscopy See Report (in chart) See Report (in chart), Patient Reported      Assessment & Plan:   Problem List Items Addressed This Visit      Cardiovascular and Mediastinum   Hypertension    Patient's been taking Benzapril only 20 mg a day with good control will continue 20 mg      Relevant Medications   benazepril (LOTENSIN) 20 MG tablet   atorvastatin (LIPITOR) 20 MG tablet   amLODipine (NORVASC) 10 MG tablet   Other Relevant Orders   CBC with Differential/Platelet     Genitourinary   Atrophic vaginitis    Patient using Estrace vaginal cream wants testosterone cream cheese used off and on over the years to aid with atrophic vaginitis and libido concerns will give prescription Handwritten prescription because of difficulty putting into the computer.        Other   Hyperlipidemia    Discussed medication may be causing some of her leg myalgias will do a trial of stopping for 2 weeks observing symptoms restarting and observing symptoms.      Relevant Medications   benazepril (LOTENSIN) 20 MG tablet   atorvastatin (LIPITOR) 20 MG tablet   amLODipine (NORVASC) 10 MG tablet   Other Relevant Orders   Lipid panel   Depression    Will discontinue Wellbutrin and start fluoxetine 10 mg observe symptoms      Relevant Medications   traZODone (DESYREL) 50 MG tablet   FLUoxetine (PROZAC) 10 MG capsule   Insomnia    Discuss insomnia patient doing well with trazodone continue using trazodone      Advanced care planning/counseling discussion    A voluntary discussion about advance care planning including the  explanation and discussion of advance directives was extensively discussed  with the patient.  Explanation about the health care proxy and Living will was reviewed and packet with forms with explanation of how to fill them out was given.        Other Visit Diagnoses    Annual physical exam    -  Primary   Screening for diabetes mellitus (DM)       Relevant Orders   Comprehensive metabolic panel   Urinalysis, Routine w reflex microscopic   Thyroid disorder screen       Relevant Orders   TSH       Follow up plan: No Follow-up on file.

## 2017-03-06 NOTE — Assessment & Plan Note (Signed)
Patient using Estrace vaginal cream wants testosterone cream cheese used off and on over the years to aid with atrophic vaginitis and libido concerns will give prescription Handwritten prescription because of difficulty putting into the computer.

## 2017-03-06 NOTE — Assessment & Plan Note (Signed)
Will discontinue Wellbutrin and start fluoxetine 10 mg observe symptoms

## 2017-03-06 NOTE — Assessment & Plan Note (Signed)
A voluntary discussion about advance care planning including the explanation and discussion of advance directives was extensively discussed  with the patient.  Explanation about the health care proxy and Living will was reviewed and packet with forms with explanation of how to fill them out was given.    

## 2017-03-07 ENCOUNTER — Encounter: Payer: Self-pay | Admitting: Family Medicine

## 2017-03-07 LAB — COMPREHENSIVE METABOLIC PANEL
ALK PHOS: 82 IU/L (ref 39–117)
ALT: 13 IU/L (ref 0–32)
AST: 21 IU/L (ref 0–40)
Albumin/Globulin Ratio: 2 (ref 1.2–2.2)
Albumin: 4.6 g/dL (ref 3.5–4.8)
BUN/Creatinine Ratio: 10 — ABNORMAL LOW (ref 12–28)
BUN: 11 mg/dL (ref 8–27)
Bilirubin Total: 0.4 mg/dL (ref 0.0–1.2)
CO2: 23 mmol/L (ref 20–29)
CREATININE: 1.08 mg/dL — AB (ref 0.57–1.00)
Calcium: 9.7 mg/dL (ref 8.7–10.3)
Chloride: 100 mmol/L (ref 96–106)
GFR calc Af Amer: 59 mL/min/{1.73_m2} — ABNORMAL LOW (ref >59)
GFR calc non Af Amer: 51 mL/min/{1.73_m2} — ABNORMAL LOW (ref >59)
GLUCOSE: 81 mg/dL (ref 65–99)
Globulin, Total: 2.3 g/dL (ref 1.5–4.5)
Potassium: 4.4 mmol/L (ref 3.5–5.2)
SODIUM: 137 mmol/L (ref 134–144)
Total Protein: 6.9 g/dL (ref 6.0–8.5)

## 2017-03-07 LAB — CBC WITH DIFFERENTIAL/PLATELET
BASOS ABS: 0 10*3/uL (ref 0.0–0.2)
Basos: 1 %
EOS (ABSOLUTE): 0.1 10*3/uL (ref 0.0–0.4)
Eos: 2 %
Hematocrit: 38.8 % (ref 34.0–46.6)
Hemoglobin: 12.3 g/dL (ref 11.1–15.9)
Immature Grans (Abs): 0 10*3/uL (ref 0.0–0.1)
Immature Granulocytes: 0 %
LYMPHS ABS: 1.4 10*3/uL (ref 0.7–3.1)
Lymphs: 35 %
MCH: 26.4 pg — ABNORMAL LOW (ref 26.6–33.0)
MCHC: 31.7 g/dL (ref 31.5–35.7)
MCV: 83 fL (ref 79–97)
MONOS ABS: 0.5 10*3/uL (ref 0.1–0.9)
Monocytes: 11 %
Neutrophils Absolute: 2.1 10*3/uL (ref 1.4–7.0)
Neutrophils: 51 %
PLATELETS: 195 10*3/uL (ref 150–379)
RBC: 4.66 x10E6/uL (ref 3.77–5.28)
RDW: 12.8 % (ref 12.3–15.4)
WBC: 4.1 10*3/uL (ref 3.4–10.8)

## 2017-03-07 LAB — LIPID PANEL
CHOLESTEROL TOTAL: 154 mg/dL (ref 100–199)
Chol/HDL Ratio: 2.2 ratio (ref 0.0–4.4)
HDL: 71 mg/dL (ref >39)
LDL CALC: 71 mg/dL (ref 0–99)
TRIGLYCERIDES: 59 mg/dL (ref 0–149)
VLDL CHOLESTEROL CAL: 12 mg/dL (ref 5–40)

## 2017-03-07 LAB — TSH: TSH: 1.25 u[IU]/mL (ref 0.450–4.500)

## 2017-05-22 DIAGNOSIS — Z79899 Other long term (current) drug therapy: Secondary | ICD-10-CM | POA: Diagnosis not present

## 2017-05-22 DIAGNOSIS — H2511 Age-related nuclear cataract, right eye: Secondary | ICD-10-CM | POA: Diagnosis not present

## 2017-05-23 ENCOUNTER — Other Ambulatory Visit: Payer: Self-pay | Admitting: Family Medicine

## 2017-05-23 NOTE — Telephone Encounter (Signed)
Routing to provider. Appt on 06/12/17.

## 2017-06-12 ENCOUNTER — Encounter: Payer: Self-pay | Admitting: Family Medicine

## 2017-06-12 ENCOUNTER — Ambulatory Visit (INDEPENDENT_AMBULATORY_CARE_PROVIDER_SITE_OTHER): Payer: PPO | Admitting: Family Medicine

## 2017-06-12 DIAGNOSIS — G2581 Restless legs syndrome: Secondary | ICD-10-CM | POA: Insufficient documentation

## 2017-06-12 DIAGNOSIS — I10 Essential (primary) hypertension: Secondary | ICD-10-CM

## 2017-06-12 DIAGNOSIS — F3342 Major depressive disorder, recurrent, in full remission: Secondary | ICD-10-CM | POA: Diagnosis not present

## 2017-06-12 DIAGNOSIS — G4700 Insomnia, unspecified: Secondary | ICD-10-CM

## 2017-06-12 MED ORDER — PREGABALIN 50 MG PO CAPS: 50.0000 mg | ORAL_CAPSULE | Freq: Three times a day (TID) | ORAL | 1 refills | Status: DC

## 2017-06-12 MED ORDER — FLUOXETINE HCL 20 MG PO CAPS: 20.0000 mg | ORAL_CAPSULE | Freq: Every day | ORAL | 3 refills | Status: DC

## 2017-06-12 NOTE — Assessment & Plan Note (Signed)
The current medical regimen is effective;  continue present plan and medications.  

## 2017-06-12 NOTE — Progress Notes (Signed)
BP 139/79   Pulse (!) 58   Wt 131 lb (59.4 kg)   SpO2 99%   BMI 23.21 kg/m    Subjective:    Patient ID: Lindsey Blair, female    DOB: 09-25-1945, 71 y.o.   MRN: [id number]  HPI: Lindsey Blair is a 71 y.o. female  Chief Complaint  Patient presents with  . Sleeping Problem  . Restless Leg  . Medication Problem    Would like to switch antidepressant   Patient sleeping problems really related to restless legs which are being helped with fluoxetine at all. Low-dose fluoxetine's doing okay but wants a little better control of irritability depression. Patient's biggest problem though is restless legs has tried gabapentin in the past and doesn't remember anything special about that medication helping.  Relevant past medical, surgical, family and social history reviewed and updated as indicated. Interim medical history since our last visit reviewed. Allergies and medications reviewed and updated.  Review of Systems  Constitutional: Negative.   Respiratory: Negative.   Cardiovascular: Negative.     Per HPI unless specifically indicated above     Objective:    BP 139/79   Pulse (!) 58   Wt 131 lb (59.4 kg)   SpO2 99%   BMI 23.21 kg/m   Wt Readings from Last 3 Encounters:  06/12/17 131 lb (59.4 kg)  03/06/17 127 lb (57.6 kg)  02/17/17 129 lb 1.6 oz (58.6 kg)    Physical Exam  Constitutional: She is oriented to person, place, and time. She appears well-developed and well-nourished.  HENT:  Head: Normocephalic and atraumatic.  Eyes: Conjunctivae and EOM are normal.  Neck: Normal range of motion.  Cardiovascular: Normal rate, regular rhythm and normal heart sounds.   Pulmonary/Chest: Effort normal and breath sounds normal.  Musculoskeletal: Normal range of motion.  Neurological: She is alert and oriented to person, place, and time.  Skin: No erythema.  Psychiatric: She has a normal mood and affect. Her behavior is normal. Judgment and thought content normal.     Results for orders placed or performed in visit on 03/06/17  Microscopic Examination  Result Value Ref Range   WBC, UA None seen 0 - 5 /hpf   RBC, UA None seen 0 - 2 /hpf   Epithelial Cells (non renal) 0-10 0 - 10 /hpf   Bacteria, UA None seen None seen/Few  CBC with Differential/Platelet  Result Value Ref Range   WBC 4.1 3.4 - 10.8 x10E3/uL   RBC 4.66 3.77 - 5.28 x10E6/uL   Hemoglobin 12.3 11.1 - 15.9 g/dL   Hematocrit 38.8 34.0 - 46.6 %   MCV 83 79 - 97 fL   MCH 26.4 (L) 26.6 - 33.0 pg   MCHC 31.7 31.5 - 35.7 g/dL   RDW 12.8 12.3 - 15.4 %   Platelets 195 150 - 379 x10E3/uL   Neutrophils 51 Not Estab. %   Lymphs 35 Not Estab. %   Monocytes 11 Not Estab. %   Eos 2 Not Estab. %   Basos 1 Not Estab. %   Neutrophils Absolute 2.1 1.4 - 7.0 x10E3/uL   Lymphocytes Absolute 1.4 0.7 - 3.1 x10E3/uL   Monocytes Absolute 0.5 0.1 - 0.9 x10E3/uL   EOS (ABSOLUTE) 0.1 0.0 - 0.4 x10E3/uL   Basophils Absolute 0.0 0.0 - 0.2 x10E3/uL   Immature Granulocytes 0 Not Estab. %   Immature Grans (Abs) 0.0 0.0 - 0.1 x10E3/uL  Comprehensive metabolic panel  Result Value Ref Range  Glucose 81 65 - 99 mg/dL   BUN 11 8 - 27 mg/dL   Creatinine, Ser 1.08 (H) 0.57 - 1.00 mg/dL   GFR calc non Af Amer 51 (L) >59 mL/min/1.73   GFR calc Af Amer 59 (L) >59 mL/min/1.73   BUN/Creatinine Ratio 10 (L) 12 - 28   Sodium 137 134 - 144 mmol/L   Potassium 4.4 3.5 - 5.2 mmol/L   Chloride 100 96 - 106 mmol/L   CO2 23 20 - 29 mmol/L   Calcium 9.7 8.7 - 10.3 mg/dL   Total Protein 6.9 6.0 - 8.5 g/dL   Albumin 4.6 3.5 - 4.8 g/dL   Globulin, Total 2.3 1.5 - 4.5 g/dL   Albumin/Globulin Ratio 2.0 1.2 - 2.2   Bilirubin Total 0.4 0.0 - 1.2 mg/dL   Alkaline Phosphatase 82 39 - 117 IU/L   AST 21 0 - 40 IU/L   ALT 13 0 - 32 IU/L  Lipid panel  Result Value Ref Range   Cholesterol, Total 154 100 - 199 mg/dL   Triglycerides 59 0 - 149 mg/dL   HDL 71 >39 mg/dL   VLDL Cholesterol Cal 12 5 - 40 mg/dL   LDL Calculated 71  0 - 99 mg/dL   Chol/HDL Ratio 2.2 0.0 - 4.4 ratio  TSH  Result Value Ref Range   TSH 1.250 0.450 - 4.500 uIU/mL  Urinalysis, Routine w reflex microscopic  Result Value Ref Range   Specific Gravity, UA 1.010 1.005 - 1.030   pH, UA 7.0 5.0 - 7.5   Color, UA Yellow Yellow   Appearance Ur Clear Clear   Leukocytes, UA Negative Negative   Protein, UA Negative Negative/Trace   Glucose, UA Negative Negative   Ketones, UA Negative Negative   RBC, UA Negative Negative   Bilirubin, UA Negative Negative   Urobilinogen, Ur 0.2 0.2 - 1.0 mg/dL   Nitrite, UA Negative Negative   Microscopic Examination See below:       Assessment & Plan:   Problem List Items Addressed This Visit      Cardiovascular and Mediastinum   Hypertension    The current medical regimen is effective;  continue present plan and medications.         Other   Depression    Increase fluoxetine to 20 mg.      Relevant Medications   FLUoxetine (PROZAC) 20 MG capsule   Insomnia    We will leave trazodone alone and see if restless legs can't be helped more.      Restless leg syndrome    Discuss restless legs medication treatment will start with Lyrica 50 mg at bedtime Will increase dosing as tolerated limiting to 150 mg twice a day          Follow up plan: Return in about 3 months (around 09/12/2017) for BMP,  Lipids, ALT, AST.

## 2017-06-12 NOTE — Assessment & Plan Note (Signed)
We will leave trazodone alone and see if restless legs can't be helped more.

## 2017-06-12 NOTE — Assessment & Plan Note (Signed)
Discuss restless legs medication treatment will start with Lyrica 50 mg at bedtime Will increase dosing as tolerated limiting to 150 mg twice a day

## 2017-06-12 NOTE — Assessment & Plan Note (Signed)
Increase fluoxetine to 20 mg

## 2017-06-20 DIAGNOSIS — L931 Subacute cutaneous lupus erythematosus: Secondary | ICD-10-CM | POA: Diagnosis not present

## 2017-06-20 DIAGNOSIS — Z79899 Other long term (current) drug therapy: Secondary | ICD-10-CM | POA: Diagnosis not present

## 2017-06-21 DIAGNOSIS — L931 Subacute cutaneous lupus erythematosus: Secondary | ICD-10-CM | POA: Diagnosis not present

## 2017-06-21 DIAGNOSIS — Z5181 Encounter for therapeutic drug level monitoring: Secondary | ICD-10-CM | POA: Diagnosis not present

## 2017-08-07 ENCOUNTER — Other Ambulatory Visit: Payer: Self-pay | Admitting: Family Medicine

## 2017-11-20 ENCOUNTER — Other Ambulatory Visit: Payer: Self-pay | Admitting: Family Medicine

## 2017-11-22 DIAGNOSIS — Z79899 Other long term (current) drug therapy: Secondary | ICD-10-CM | POA: Diagnosis not present

## 2017-11-23 ENCOUNTER — Telehealth: Payer: Self-pay | Admitting: Family Medicine

## 2017-11-23 NOTE — Telephone Encounter (Signed)
Refill request for testosterone compounding RX to [facility]. Fax states pt was receiving medications from [facility] which closed and needs prescription refill due to having no records of RX.

## 2017-11-24 ENCOUNTER — Encounter: Payer: Self-pay | Admitting: *Deleted

## 2017-11-24 NOTE — Telephone Encounter (Signed)
This encounter was created in error - please disregard.

## 2017-11-24 NOTE — Telephone Encounter (Signed)
Pt calling back requesting rx for testosterone to be sent to [facility].

## 2017-11-27 NOTE — Telephone Encounter (Signed)
Called and left patient a VM asking for her to please return my call.  

## 2017-11-27 NOTE — Telephone Encounter (Signed)
Left message on machine for pt to return call to the office.  

## 2017-11-27 NOTE — Telephone Encounter (Signed)
Call pt 

## 2017-11-28 ENCOUNTER — Telehealth: Payer: Self-pay | Admitting: Family Medicine

## 2017-11-28 NOTE — Telephone Encounter (Signed)
Patient was transferred to provider for telephone conversation.   

## 2017-11-28 NOTE — Telephone Encounter (Signed)
Patient returned call. She wants to make sure Dr Jeananne Rama calls her cell phone  [phone]  Thank you

## 2017-11-28 NOTE — Telephone Encounter (Signed)
[phone]  

## 2017-12-13 DIAGNOSIS — R319 Hematuria, unspecified: Secondary | ICD-10-CM | POA: Diagnosis not present

## 2017-12-13 DIAGNOSIS — I129 Hypertensive chronic kidney disease with stage 1 through stage 4 chronic kidney disease, or unspecified chronic kidney disease: Secondary | ICD-10-CM | POA: Diagnosis not present

## 2017-12-13 DIAGNOSIS — M329 Systemic lupus erythematosus, unspecified: Secondary | ICD-10-CM | POA: Diagnosis not present

## 2017-12-13 DIAGNOSIS — N183 Chronic kidney disease, stage 3 (moderate): Secondary | ICD-10-CM | POA: Diagnosis not present

## 2017-12-13 DIAGNOSIS — R809 Proteinuria, unspecified: Secondary | ICD-10-CM | POA: Diagnosis not present

## 2018-01-02 ENCOUNTER — Other Ambulatory Visit: Payer: Self-pay | Admitting: Family Medicine

## 2018-02-07 ENCOUNTER — Other Ambulatory Visit: Payer: Self-pay | Admitting: Family Medicine

## 2018-02-21 ENCOUNTER — Other Ambulatory Visit: Payer: Self-pay | Admitting: Family Medicine

## 2018-02-21 DIAGNOSIS — Z1231 Encounter for screening mammogram for malignant neoplasm of breast: Secondary | ICD-10-CM

## 2018-02-22 ENCOUNTER — Ambulatory Visit
Admission: RE | Admit: 2018-02-22 | Discharge: 2018-02-22 | Disposition: A | Discharge status: Home / Self Care | Payer: PPO | Source: Ambulatory Visit | Attending: Family Medicine | Admitting: Family Medicine

## 2018-02-22 DIAGNOSIS — Z1231 Encounter for screening mammogram for malignant neoplasm of breast: Secondary | ICD-10-CM | POA: Diagnosis not present

## 2018-03-13 ENCOUNTER — Other Ambulatory Visit: Payer: Self-pay | Admitting: Family Medicine

## 2018-03-13 DIAGNOSIS — I10 Essential (primary) hypertension: Secondary | ICD-10-CM

## 2018-03-14 NOTE — Telephone Encounter (Signed)
Patient called, left VM to return call to schedule and annual visit.  Benazapril-last refill 03/06/17 90 tab/4 refill Hydrodiuril-last refill 08/08/17 90 tab/0 refill Trazadone-last refill 03/06/17 90 tab/4 refill (last OV note to hold on this, 06/12/17) [facility] Pharmacy: [place], [place] - [place] [phone] (Phone) [phone] (Fax)

## 2018-03-14 NOTE — Telephone Encounter (Signed)
Apt [facility]

## 2018-03-16 ENCOUNTER — Other Ambulatory Visit: Payer: Self-pay

## 2018-03-19 MED ORDER — HYDROCHLOROTHIAZIDE 12.5 MG PO TABS: 12.5000 mg | ORAL_TABLET | Freq: Every day | ORAL | 2 refills | Status: DC

## 2018-03-19 MED ORDER — TRAZODONE HCL 50 MG PO TABS: 50.0000 mg | ORAL_TABLET | Freq: Every day | ORAL | 2 refills | Status: DC

## 2018-05-14 ENCOUNTER — Encounter: Payer: Self-pay | Admitting: Family Medicine

## 2018-05-14 ENCOUNTER — Ambulatory Visit (INDEPENDENT_AMBULATORY_CARE_PROVIDER_SITE_OTHER): Payer: PPO | Admitting: Family Medicine

## 2018-05-14 VITALS — BP 150/76 | HR 61 | Temp 98.2°F | Wt 131.3 lb

## 2018-05-14 DIAGNOSIS — E7800 Pure hypercholesterolemia, unspecified: Secondary | ICD-10-CM

## 2018-05-14 DIAGNOSIS — K2100 Gastro-esophageal reflux disease with esophagitis, without bleeding: Secondary | ICD-10-CM

## 2018-05-14 DIAGNOSIS — N3000 Acute cystitis without hematuria: Secondary | ICD-10-CM | POA: Diagnosis not present

## 2018-05-14 DIAGNOSIS — Z23 Encounter for immunization: Secondary | ICD-10-CM

## 2018-05-14 DIAGNOSIS — G4700 Insomnia, unspecified: Secondary | ICD-10-CM | POA: Diagnosis not present

## 2018-05-14 DIAGNOSIS — F3342 Major depressive disorder, recurrent, in full remission: Secondary | ICD-10-CM

## 2018-05-14 DIAGNOSIS — I10 Essential (primary) hypertension: Secondary | ICD-10-CM

## 2018-05-14 LAB — UA/M W/RFLX CULTURE, ROUTINE
BILIRUBIN UA: NEGATIVE
Glucose, UA: NEGATIVE
Ketones, UA: NEGATIVE
Nitrite, UA: POSITIVE — AB
PROTEIN UA: NEGATIVE
RBC UA: NEGATIVE
Urobilinogen, Ur: 0.2 mg/dL (ref 0.2–1.0)
pH, UA: 6 (ref 5.0–7.5)

## 2018-05-14 LAB — MICROALBUMIN, URINE WAIVED
CREATININE, URINE WAIVED: 10 mg/dL (ref 10–300)
Microalb, Ur Waived: 10 mg/L (ref 0–19)
Microalb/Creat Ratio: <30 mg/g (ref <30)

## 2018-05-14 LAB — MICROSCOPIC EXAMINATION: RBC MICROSCOPIC, UA: NONE SEEN /HPF (ref 0–2)

## 2018-05-14 MED ORDER — NITROFURANTOIN MONOHYD MACRO 100 MG PO CAPS: 100.0000 mg | ORAL_CAPSULE | Freq: Two times a day (BID) | ORAL | 0 refills | Status: DC

## 2018-05-14 MED ORDER — BENAZEPRIL HCL 20 MG PO TABS: 20.0000 mg | ORAL_TABLET | Freq: Every day | ORAL | 1 refills | Status: DC

## 2018-05-14 MED ORDER — PREGABALIN 50 MG PO CAPS: ORAL_CAPSULE | ORAL | 1 refills | Status: DC

## 2018-05-14 MED ORDER — BUPROPION HCL ER (SR) 150 MG PO TB12: 150.0000 mg | ORAL_TABLET | Freq: Two times a day (BID) | ORAL | 1 refills | Status: DC

## 2018-05-14 MED ORDER — AMLODIPINE BESYLATE 10 MG PO TABS: 10.0000 mg | ORAL_TABLET | Freq: Every day | ORAL | 1 refills | Status: DC

## 2018-05-14 MED ORDER — ATORVASTATIN CALCIUM 20 MG PO TABS: 20.0000 mg | ORAL_TABLET | Freq: Every day | ORAL | 1 refills | Status: DC

## 2018-05-14 MED ORDER — PANTOPRAZOLE SODIUM 40 MG PO TBEC: DELAYED_RELEASE_TABLET | ORAL | 1 refills | Status: DC

## 2018-05-14 MED ORDER — HYDROCHLOROTHIAZIDE 12.5 MG PO TABS: 12.5000 mg | ORAL_TABLET | Freq: Every day | ORAL | 1 refills | Status: DC

## 2018-05-14 MED ORDER — TRAZODONE HCL 50 MG PO TABS: 50.0000 mg | ORAL_TABLET | Freq: Every day | ORAL | 1 refills | Status: DC

## 2018-05-14 NOTE — Progress Notes (Signed)
BP (!) 150/76 (BP Location: Left Arm, Patient Position: Sitting, Cuff Size: Normal)   Pulse 61   Temp 98.2 F (36.8 C)   Wt 131 lb 5 oz (59.6 kg)   SpO2 99%   BMI 23.26 kg/m    Subjective:    Patient ID: Lindsey Blair, female    DOB: [birth year], 72 y.o.   MRN: [id number]  HPI: Lindsey Blair is a 72 y.o. female who presents today after being lost to follow up for 11 months.   Chief Complaint  Patient presents with  . Hypertension  . Hyperlipidemia  . Depression   HYPERTENSION / HYPERLIPIDEMIA- has not been taking any of her medicine for 2 days Satisfied with current treatment? yes Duration of hypertension: chronic BP monitoring frequency: not checking BP medication side effects: no Past BP meds: amlodipine, benazepril, HCTZ Duration of hyperlipidemia: chronic Cholesterol medication side effects: no Cholesterol supplements: none Past cholesterol medications: atorvastatin Medication compliance: excellent compliance Aspirin: no Recent stressors: no Recurrent headaches: no Visual changes: no Palpitations: no Dyspnea: no Chest pain: no Lower extremity edema: no Dizzy/lightheaded: no  DEPRESSION Mood status: controlled Satisfied with current treatment?: yes Symptom severity: mild  Duration of current treatment : chronic Side effects: no Medication compliance: good compliance Psychotherapy/counseling: no  Previous psychiatric medications: wellbutrin Depressed mood: no Anxious mood: no Anhedonia: no Significant weight loss or gain: no Insomnia: no  Fatigue: no Feelings of worthlessness or guilt: no Impaired concentration/indecisiveness: no Suicidal ideations: no Hopelessness: no Crying spells: no Depression screen [facility] 2/9 05/14/2018 06/12/2017 02/17/2017 07/20/2016 01/06/2016  Decreased Interest 0 0 0 0 0  Down, Depressed, Hopeless 0 0 0 0 0  PHQ - 2 Score 0 0 0 0 0  Altered sleeping 1 - - 2 -  Tired, decreased energy 0 - - 0 -  Change in appetite 0 - - 0 -   Feeling bad or failure about yourself  0 - - 0 -  Trouble concentrating 0 - - 0 -  Moving slowly or fidgety/restless 0 - - 0 -  Suicidal thoughts 0 - - 0 -  PHQ-9 Score 1 - - 2 -  Difficult doing work/chores Not difficult at all - - - -   GAD 7 : Generalized Anxiety Score 05/14/2018  Nervous, Anxious, on Edge 0  Control/stop worrying 0  Worry too much - different things 0  Trouble relaxing 0  Restless 0  Easily annoyed or irritable 0  Afraid - awful might happen 0  Total GAD 7 Score 0  Anxiety Difficulty Not difficult at all     URINARY SYMPTOMS Duration: couple of weeks Dysuria: burning Urinary frequency: yes Urgency: yes Small volume voids: yes Symptom severity: moderate Urinary incontinence: no Foul odor: no Hematuria: no Abdominal pain: no Back pain: no Suprapubic pain/pressure: yes Flank pain: no Fever:  no Vomiting: no Relief with cranberry juice: no Relief with pyridium: yes Status: better/worse/stable Previous urinary tract infection: yes Recurrent urinary tract infection: no Treatments attempted: pyridium     Relevant past medical, surgical, family and social history reviewed and updated as indicated. Interim medical history since our last visit reviewed. Allergies and medications reviewed and updated.  Review of Systems  Constitutional: Negative.   Respiratory: Negative.   Cardiovascular: Negative.   Genitourinary: Positive for dysuria, frequency and urgency. Negative for decreased urine volume, difficulty urinating, dyspareunia, enuresis, flank pain, genital sores, hematuria, menstrual problem, pelvic pain, vaginal bleeding, vaginal discharge and vaginal pain.  Psychiatric/Behavioral: Negative.  Per HPI unless specifically indicated above     Objective:    BP (!) 150/76 (BP Location: Left Arm, Patient Position: Sitting, Cuff Size: Normal)   Pulse 61   Temp 98.2 F (36.8 C)   Wt 131 lb 5 oz (59.6 kg)   SpO2 99%   BMI 23.26 kg/m   Wt  Readings from Last 3 Encounters:  05/14/18 131 lb 5 oz (59.6 kg)  06/12/17 131 lb (59.4 kg)  03/06/17 127 lb (57.6 kg)    Physical Exam  Constitutional: She is oriented to person, place, and time. She appears well-developed and well-nourished. No distress.  HENT:  Head: Normocephalic and atraumatic.  Right Ear: Hearing normal.  Left Ear: Hearing normal.  Nose: Nose normal.  Eyes: Conjunctivae and lids are normal. Right eye exhibits no discharge. Left eye exhibits no discharge. No scleral icterus.  Cardiovascular: Normal rate, regular rhythm, normal heart sounds and intact distal pulses. Exam reveals no gallop and no friction rub.  No murmur heard. Pulmonary/Chest: Effort normal and breath sounds normal. No stridor. No respiratory distress. She has no wheezes. She has no rales. She exhibits no tenderness.  Musculoskeletal: Normal range of motion.  Neurological: She is alert and oriented to person, place, and time.  Skin: Skin is warm, dry and intact. Capillary refill takes less than 2 seconds. No rash noted. She is not diaphoretic. No erythema. No pallor.  Psychiatric: She has a normal mood and affect. Her speech is normal and behavior is normal. Judgment and thought content normal. Cognition and memory are normal.  Nursing note and vitals reviewed.   Results for orders placed or performed in visit on 03/06/17  Microscopic Examination  Result Value Ref Range   WBC, UA None seen 0 - 5 /hpf   RBC, UA None seen 0 - 2 /hpf   Epithelial Cells (non renal) 0-10 0 - 10 /hpf   Bacteria, UA None seen None seen/Few  CBC with Differential/Platelet  Result Value Ref Range   WBC 4.1 3.4 - 10.8 x10E3/uL   RBC 4.66 3.77 - 5.28 x10E6/uL   Hemoglobin 12.3 11.1 - 15.9 g/dL   Hematocrit 38.8 34.0 - 46.6 %   MCV 83 79 - 97 fL   MCH 26.4 (L) 26.6 - 33.0 pg   MCHC 31.7 31.5 - 35.7 g/dL   RDW 12.8 12.3 - 15.4 %   Platelets 195 150 - 379 x10E3/uL   Neutrophils 51 Not Estab. %   Lymphs 35 Not Estab. %    Monocytes 11 Not Estab. %   Eos 2 Not Estab. %   Basos 1 Not Estab. %   Neutrophils Absolute 2.1 1.4 - 7.0 x10E3/uL   Lymphocytes Absolute 1.4 0.7 - 3.1 x10E3/uL   Monocytes Absolute 0.5 0.1 - 0.9 x10E3/uL   EOS (ABSOLUTE) 0.1 0.0 - 0.4 x10E3/uL   Basophils Absolute 0.0 0.0 - 0.2 x10E3/uL   Immature Granulocytes 0 Not Estab. %   Immature Grans (Abs) 0.0 0.0 - 0.1 x10E3/uL  Comprehensive metabolic panel  Result Value Ref Range   Glucose 81 65 - 99 mg/dL   BUN 11 8 - 27 mg/dL   Creatinine, Ser 1.08 (H) 0.57 - 1.00 mg/dL   GFR calc non Af Amer 51 (L) >59 mL/min/1.73   GFR calc Af Amer 59 (L) >59 mL/min/1.73   BUN/Creatinine Ratio 10 (L) 12 - 28   Sodium 137 134 - 144 mmol/L   Potassium 4.4 3.5 - 5.2 mmol/L   Chloride 100 96 -  106 mmol/L   CO2 23 20 - 29 mmol/L   Calcium 9.7 8.7 - 10.3 mg/dL   Total Protein 6.9 6.0 - 8.5 g/dL   Albumin 4.6 3.5 - 4.8 g/dL   Globulin, Total 2.3 1.5 - 4.5 g/dL   Albumin/Globulin Ratio 2.0 1.2 - 2.2   Bilirubin Total 0.4 0.0 - 1.2 mg/dL   Alkaline Phosphatase 82 39 - 117 IU/L   AST 21 0 - 40 IU/L   ALT 13 0 - 32 IU/L  Lipid panel  Result Value Ref Range   Cholesterol, Total 154 100 - 199 mg/dL   Triglycerides 59 0 - 149 mg/dL   HDL 71 >39 mg/dL   VLDL Cholesterol Cal 12 5 - 40 mg/dL   LDL Calculated 71 0 - 99 mg/dL   Chol/HDL Ratio 2.2 0.0 - 4.4 ratio  TSH  Result Value Ref Range   TSH 1.250 0.450 - 4.500 uIU/mL  Urinalysis, Routine w reflex microscopic  Result Value Ref Range   Specific Gravity, UA 1.010 1.005 - 1.030   pH, UA 7.0 5.0 - 7.5   Color, UA Yellow Yellow   Appearance Ur Clear Clear   Leukocytes, UA Negative Negative   Protein, UA Negative Negative/Trace   Glucose, UA Negative Negative   Ketones, UA Negative Negative   RBC, UA Negative Negative   Bilirubin, UA Negative Negative   Urobilinogen, Ur 0.2 0.2 - 1.0 mg/dL   Nitrite, UA Negative Negative   Microscopic Examination See below:       Assessment & Plan:    Problem List Items Addressed This Visit      Cardiovascular and Mediastinum   Hypertension - Primary    Has not been taking her medicine. Restart medicine and recheck at physical in December. Refills given.       Relevant Medications   amLODipine (NORVASC) 10 MG tablet   benazepril (LOTENSIN) 20 MG tablet   atorvastatin (LIPITOR) 20 MG tablet   hydrochlorothiazide (HYDRODIURIL) 12.5 MG tablet   Other Relevant Orders   CBC with Differential/Platelet   Comprehensive metabolic panel   Microalbumin, Urine Waived   TSH   UA/M w/rflx Culture, Routine     Digestive   Reflux esophagitis    Under good control on current regimen. Continue current regimen. Continue to monitor. Call with any concerns. Refills given.          Other   Hyperlipidemia    Under good control on current regimen. Continue current regimen. Continue to monitor. Call with any concerns. Refills given.        Relevant Medications   amLODipine (NORVASC) 10 MG tablet   benazepril (LOTENSIN) 20 MG tablet   atorvastatin (LIPITOR) 20 MG tablet   hydrochlorothiazide (HYDRODIURIL) 12.5 MG tablet   Other Relevant Orders   CBC with Differential/Platelet   Comprehensive metabolic panel   Lipid Panel w/o Chol/HDL Ratio   UA/M w/rflx Culture, Routine   Depression    Under good control on current regimen. Continue current regimen. Continue to monitor. Call with any concerns. Refills given.        Relevant Medications   buPROPion (WELLBUTRIN SR) 150 MG 12 hr tablet   traZODone (DESYREL) 50 MG tablet   Other Relevant Orders   CBC with Differential/Platelet   Comprehensive metabolic panel   UA/M w/rflx Culture, Routine   Insomnia    Under good control on current regimen. Continue current regimen. Continue to monitor. Call with any concerns. Refills given.  Other Visit Diagnoses    Immunization due       Flu shot given today.   Relevant Orders   Flu vaccine HIGH DOSE PF (Fluzone High dose)  (Completed)   Acute cystitis without hematuria       Will treat with nitrofurantoin. Call with any concerns.    Relevant Orders   Urine Culture       Follow up plan: Return in about 6 months (around 11/12/2018) for Physical and wellness.

## 2018-05-14 NOTE — Assessment & Plan Note (Signed)
Under good control on current regimen. Continue current regimen. Continue to monitor. Call with any concerns. Refills given.   

## 2018-05-14 NOTE — Assessment & Plan Note (Signed)
Has not been taking her medicine. Restart medicine and recheck at physical in December. Refills given.

## 2018-05-14 NOTE — Patient Instructions (Signed)

## 2018-05-15 ENCOUNTER — Telehealth: Payer: Self-pay | Admitting: Family Medicine

## 2018-05-15 DIAGNOSIS — R718 Other abnormality of red blood cells: Secondary | ICD-10-CM

## 2018-05-15 DIAGNOSIS — D649 Anemia, unspecified: Secondary | ICD-10-CM

## 2018-05-15 LAB — COMPREHENSIVE METABOLIC PANEL
ALBUMIN: 4 g/dL (ref 3.5–4.8)
ALK PHOS: 86 IU/L (ref 39–117)
ALT: 10 IU/L (ref 0–32)
AST: 15 IU/L (ref 0–40)
Albumin/Globulin Ratio: 1.5 (ref 1.2–2.2)
BUN/Creatinine Ratio: 14 (ref 12–28)
BUN: 15 mg/dL (ref 8–27)
Bilirubin Total: 0.2 mg/dL (ref 0.0–1.2)
CO2: 24 mmol/L (ref 20–29)
CREATININE: 1.11 mg/dL — AB (ref 0.57–1.00)
Calcium: 10.8 mg/dL — ABNORMAL HIGH (ref 8.7–10.3)
Chloride: 100 mmol/L (ref 96–106)
GFR calc Af Amer: 57 mL/min/{1.73_m2} — ABNORMAL LOW (ref >59)
GFR calc non Af Amer: 49 mL/min/{1.73_m2} — ABNORMAL LOW (ref >59)
GLOBULIN, TOTAL: 2.7 g/dL (ref 1.5–4.5)
Glucose: 84 mg/dL (ref 65–99)
Potassium: 3.9 mmol/L (ref 3.5–5.2)
SODIUM: 137 mmol/L (ref 134–144)
Total Protein: 6.7 g/dL (ref 6.0–8.5)

## 2018-05-15 LAB — CBC WITH DIFFERENTIAL/PLATELET
Basophils Absolute: 0 10*3/uL (ref 0.0–0.2)
Basos: 1 %
EOS (ABSOLUTE): 0 10*3/uL (ref 0.0–0.4)
EOS: 1 %
HEMATOCRIT: 34.8 % (ref 34.0–46.6)
HEMOGLOBIN: 10.8 g/dL — AB (ref 11.1–15.9)
IMMATURE GRANS (ABS): 0 10*3/uL (ref 0.0–0.1)
Immature Granulocytes: 0 %
LYMPHS ABS: 1.3 10*3/uL (ref 0.7–3.1)
LYMPHS: 29 %
MCH: 20.9 pg — ABNORMAL LOW (ref 26.6–33.0)
MCHC: 31 g/dL — AB (ref 31.5–35.7)
MCV: 67 fL — ABNORMAL LOW (ref 79–97)
MONOCYTES: 9 %
Monocytes Absolute: 0.4 10*3/uL (ref 0.1–0.9)
NEUTROS ABS: 2.8 10*3/uL (ref 1.4–7.0)
Neutrophils: 60 %
Platelets: 223 10*3/uL (ref 150–450)
RBC: 5.16 x10E6/uL (ref 3.77–5.28)
RDW: 17 % — ABNORMAL HIGH (ref 12.3–15.4)
WBC: 4.6 10*3/uL (ref 3.4–10.8)

## 2018-05-15 LAB — TSH: TSH: 0.788 u[IU]/mL (ref 0.450–4.500)

## 2018-05-15 LAB — LIPID PANEL W/O CHOL/HDL RATIO
CHOLESTEROL TOTAL: 163 mg/dL (ref 100–199)
HDL: 67 mg/dL (ref >39)
LDL CALC: 77 mg/dL (ref 0–99)
Triglycerides: 96 mg/dL (ref 0–149)
VLDL CHOLESTEROL CAL: 19 mg/dL (ref 5–40)

## 2018-05-15 MED ORDER — FERROUS SULFATE 325 (65 FE) MG PO TABS: 325.0000 mg | ORAL_TABLET | Freq: Two times a day (BID) | ORAL | 2 refills | Status: DC

## 2018-05-15 NOTE — Telephone Encounter (Signed)
Please let her know that her labs look good except she's a little anemic. I've sent some iron to her pharmacy and I'd like her to come back in in a month for some repeat labs. Thanks! (orders in)

## 2018-05-16 LAB — URINE CULTURE

## 2018-05-17 NOTE — Telephone Encounter (Signed)
Pt notified of lab result. Schedule a month f/u appointment for 06/18/18 @ 9:00 am w/ Dr. Wynetta Emery

## 2018-05-29 DIAGNOSIS — Z961 Presence of intraocular lens: Secondary | ICD-10-CM | POA: Diagnosis not present

## 2018-06-18 ENCOUNTER — Other Ambulatory Visit: Payer: PPO

## 2018-06-18 DIAGNOSIS — D649 Anemia, unspecified: Secondary | ICD-10-CM

## 2018-06-19 ENCOUNTER — Telehealth: Payer: Self-pay | Admitting: Family Medicine

## 2018-06-19 DIAGNOSIS — D649 Anemia, unspecified: Secondary | ICD-10-CM

## 2018-06-19 LAB — CBC WITH DIFFERENTIAL/PLATELET
Basophils Absolute: 0 10*3/uL (ref 0.0–0.2)
Basos: 1 %
EOS (ABSOLUTE): 0.1 10*3/uL (ref 0.0–0.4)
Eos: 2 %
HEMOGLOBIN: 13.4 g/dL (ref 11.1–15.9)
Hematocrit: 42.5 % (ref 34.0–46.6)
IMMATURE GRANS (ABS): 0 10*3/uL (ref 0.0–0.1)
Immature Granulocytes: 0 %
Lymphocytes Absolute: 1.4 10*3/uL (ref 0.7–3.1)
Lymphs: 43 %
MCH: 23.9 pg — ABNORMAL LOW (ref 26.6–33.0)
MCHC: 31.5 g/dL (ref 31.5–35.7)
MCV: 76 fL — ABNORMAL LOW (ref 79–97)
MONOS ABS: 0.3 10*3/uL (ref 0.1–0.9)
Monocytes: 9 %
NEUTROS PCT: 45 %
Neutrophils Absolute: 1.5 10*3/uL (ref 1.4–7.0)
PLATELETS: 167 10*3/uL (ref 150–450)
RBC: 5.61 x10E6/uL — ABNORMAL HIGH (ref 3.77–5.28)
RDW: 22.3 % — ABNORMAL HIGH (ref 12.3–15.4)
WBC: 3.4 10*3/uL (ref 3.4–10.8)

## 2018-06-19 LAB — FERRITIN: Ferritin: 50 ng/mL (ref 15–150)

## 2018-06-19 LAB — IRON AND TIBC
IRON SATURATION: 24 % (ref 15–55)
IRON: 81 ug/dL (ref 27–139)
Total Iron Binding Capacity: 338 ug/dL (ref 250–450)
UIBC: 257 ug/dL (ref 118–369)

## 2018-06-19 NOTE — Telephone Encounter (Signed)
Patient notified

## 2018-06-19 NOTE — Telephone Encounter (Signed)
Left message on machine for pt to return call to the office. CRM created.  

## 2018-06-19 NOTE — Telephone Encounter (Signed)
Please let her know that her iron came back to normal and everything looks good. I do want to make sure she's not losing blood from her bottom, so I've got a stool kit up front for her that I'd like her to pick up and mail back. Thanks!

## 2018-06-21 ENCOUNTER — Other Ambulatory Visit: Payer: PPO

## 2018-06-21 DIAGNOSIS — D649 Anemia, unspecified: Secondary | ICD-10-CM

## 2018-06-27 LAB — FECAL OCCULT BLOOD, IMMUNOCHEMICAL: Fecal Occult Bld: POSITIVE — AB

## 2018-06-28 ENCOUNTER — Telehealth: Payer: Self-pay | Admitting: Family Medicine

## 2018-06-28 DIAGNOSIS — R195 Other fecal abnormalities: Secondary | ICD-10-CM

## 2018-06-28 NOTE — Telephone Encounter (Signed)
Message relayed to patient. Verbalized understanding and denied questions.   

## 2018-06-28 NOTE — Telephone Encounter (Signed)
Please let her know that her stool test did come back positive for blood. I'd like her to go back to see her stomach doctor. I know she just got a colonoscopy in 2017, but I'd like her to see them again. (I'll put in the referral if she needs me to)

## 2018-06-28 NOTE — Telephone Encounter (Signed)
Patient would like to know if she can do it again, that when she did that study she was constipated.

## 2018-06-28 NOTE — Telephone Encounter (Signed)
She can. Packet up front for her to pick up and order in.

## 2018-06-28 NOTE — Telephone Encounter (Signed)
Patient notified

## 2018-07-06 ENCOUNTER — Other Ambulatory Visit: Payer: PPO

## 2018-07-06 DIAGNOSIS — R195 Other fecal abnormalities: Secondary | ICD-10-CM | POA: Diagnosis not present

## 2018-07-08 LAB — FECAL OCCULT BLOOD, IMMUNOCHEMICAL: FECAL OCCULT BLD: NEGATIVE

## 2018-07-09 ENCOUNTER — Telehealth: Payer: Self-pay | Admitting: Family Medicine

## 2018-07-09 NOTE — Telephone Encounter (Signed)
Please let her know that her stool study came back negative for blood- I would still advise her to follow up with her stomach doctor. Thanks!

## 2018-07-09 NOTE — Telephone Encounter (Signed)
Called and left patient a VM asking for her to please return my call. OK for PEC to speak to patient and let her know what Dr. Wynetta Emery said if she calls back.

## 2018-07-09 NOTE — Telephone Encounter (Signed)
Pt. Given Dr. Durenda Age message with results and verbalizes understanding.

## 2018-08-07 ENCOUNTER — Ambulatory Visit (INDEPENDENT_AMBULATORY_CARE_PROVIDER_SITE_OTHER): Payer: PPO | Admitting: Family Medicine

## 2018-08-07 ENCOUNTER — Encounter: Payer: Self-pay | Admitting: Family Medicine

## 2018-08-07 DIAGNOSIS — K2100 Gastro-esophageal reflux disease with esophagitis, without bleeding: Secondary | ICD-10-CM

## 2018-08-07 DIAGNOSIS — I10 Essential (primary) hypertension: Secondary | ICD-10-CM

## 2018-08-07 DIAGNOSIS — E7800 Pure hypercholesterolemia, unspecified: Secondary | ICD-10-CM

## 2018-08-07 DIAGNOSIS — L932 Other local lupus erythematosus: Secondary | ICD-10-CM | POA: Diagnosis not present

## 2018-08-07 DIAGNOSIS — G2581 Restless legs syndrome: Secondary | ICD-10-CM

## 2018-08-07 DIAGNOSIS — F3342 Major depressive disorder, recurrent, in full remission: Secondary | ICD-10-CM | POA: Diagnosis not present

## 2018-08-07 DIAGNOSIS — G4700 Insomnia, unspecified: Secondary | ICD-10-CM

## 2018-08-07 DIAGNOSIS — Z7189 Other specified counseling: Secondary | ICD-10-CM | POA: Diagnosis not present

## 2018-08-07 LAB — MICROSCOPIC EXAMINATION
Bacteria, UA: NONE SEEN
RBC, UA: NONE SEEN /hpf (ref 0–2)

## 2018-08-07 LAB — URINALYSIS, ROUTINE W REFLEX MICROSCOPIC
BILIRUBIN UA: NEGATIVE
GLUCOSE, UA: NEGATIVE
KETONES UA: NEGATIVE
NITRITE UA: NEGATIVE
Protein, UA: NEGATIVE
RBC, UA: NEGATIVE
SPEC GRAV UA: 1.015 (ref 1.005–1.030)
UUROB: 0.2 mg/dL (ref 0.2–1.0)
pH, UA: 7 (ref 5.0–7.5)

## 2018-08-07 MED ORDER — BENAZEPRIL HCL 20 MG PO TABS: 20.0000 mg | ORAL_TABLET | Freq: Every day | ORAL | 4 refills | Status: DC

## 2018-08-07 MED ORDER — BUPROPION HCL ER (SR) 150 MG PO TB12: 150.0000 mg | ORAL_TABLET | Freq: Two times a day (BID) | ORAL | 4 refills | Status: DC

## 2018-08-07 MED ORDER — ATORVASTATIN CALCIUM 20 MG PO TABS: 20.0000 mg | ORAL_TABLET | Freq: Every day | ORAL | 4 refills | Status: DC

## 2018-08-07 MED ORDER — AMLODIPINE BESYLATE 10 MG PO TABS: 10.0000 mg | ORAL_TABLET | Freq: Every day | ORAL | 4 refills | Status: DC

## 2018-08-07 MED ORDER — PANTOPRAZOLE SODIUM 40 MG PO TBEC: DELAYED_RELEASE_TABLET | ORAL | 4 refills | Status: DC

## 2018-08-07 MED ORDER — TRAZODONE HCL 50 MG PO TABS: 50.0000 mg | ORAL_TABLET | Freq: Every day | ORAL | 4 refills | Status: DC

## 2018-08-07 MED ORDER — HYDROCHLOROTHIAZIDE 12.5 MG PO TABS: 12.5000 mg | ORAL_TABLET | Freq: Every day | ORAL | 4 refills | Status: DC

## 2018-08-07 NOTE — Assessment & Plan Note (Signed)
The current medical regimen is effective;  continue present plan and medications.  

## 2018-08-07 NOTE — Assessment & Plan Note (Signed)
A voluntary discussion about advanced care planning including explanation and discussion of advanced directives was extentively discussed with the patient.  Explained about the healthcare proxy and living will was reviewed and packet with forms with expiration of how to fill them out was given.  Time spent: Encounter 16+ min individuals present: Patient 

## 2018-08-07 NOTE — Assessment & Plan Note (Signed)
Followed by dermatology

## 2018-08-07 NOTE — Progress Notes (Signed)
BP 122/74   Pulse 76   Temp 98.4 F (36.9 C) (Oral)   Ht 5' 1.9" (1.572 m)   Wt 125 lb 12.8 oz (57.1 kg)   SpO2 96%   BMI 23.08 kg/m    Subjective:    Patient ID: Lindsey Blair, female    DOB: [birth year], 72 y.o.   MRN: [id number]  HPI: Lindsey Blair is a 72 y.o. female  Chief Complaint  Patient presents with  . Annual Exam  Patient doing well with Lyrica directions say 3 a day patient may take 3 a week only takes as needed for leg pain at night.  This works well for her. Reflux doing well taking Protonix without problems takes mostly faithfully but takes an occasional drug holiday. Blood pressure medications doing well no issues or concerns from medications wondering about combining medications.  This was discussed and limited availability and insurance will avoid combining for now. Patient's taking bupropion had been stable on 1 a day but now is been taking 2 a day and sometimes 3 a day.  Does seem to help with depression and nerves. Takes Plaquenil without problems this is prescribed by dermatology and doing well. Cholesterol medicine doing well without problems. Not taking iron anymore and wondering about taking iron supplements.  Relevant past medical, surgical, family and social history reviewed and updated as indicated. Interim medical history since our last visit reviewed. Allergies and medications reviewed and updated.  Review of Systems  Constitutional: Negative.   HENT: Negative.   Eyes: Negative.   Respiratory: Negative.   Cardiovascular: Negative.   Gastrointestinal: Negative.   Endocrine: Negative.   Genitourinary: Negative.   Musculoskeletal: Negative.   Skin: Negative.   Allergic/Immunologic: Negative.   Neurological: Negative.   Hematological: Negative.   Psychiatric/Behavioral: Negative.     Per HPI unless specifically indicated above     Objective:    BP 122/74   Pulse 76   Temp 98.4 F (36.9 C) (Oral)   Ht 5' 1.9" (1.572 m)   Wt 125  lb 12.8 oz (57.1 kg)   SpO2 96%   BMI 23.08 kg/m   Wt Readings from Last 3 Encounters:  08/07/18 125 lb 12.8 oz (57.1 kg)  05/14/18 131 lb 5 oz (59.6 kg)  06/12/17 131 lb (59.4 kg)    Physical Exam  Constitutional: She is oriented to person, place, and time. She appears well-developed and well-nourished.  HENT:  Head: Normocephalic and atraumatic.  Right Ear: External ear normal.  Left Ear: External ear normal.  Nose: Nose normal.  Mouth/Throat: Oropharynx is clear and moist.  Eyes: Pupils are equal, round, and reactive to light. Conjunctivae and EOM are normal.  Neck: Normal range of motion. Neck supple. Carotid bruit is not present.  Cardiovascular: Normal rate, regular rhythm and normal heart sounds.  No murmur heard. Pulmonary/Chest: Effort normal and breath sounds normal. She exhibits no mass. Right breast exhibits no mass, no skin change and no tenderness. Left breast exhibits no mass, no skin change and no tenderness. Breasts are symmetrical.  Abdominal: Soft. Bowel sounds are normal. There is no hepatosplenomegaly.  Musculoskeletal: Normal range of motion.  Neurological: She is alert and oriented to person, place, and time.  Skin: No rash noted.  Psychiatric: She has a normal mood and affect. Her behavior is normal. Judgment and thought content normal.    Results for orders placed or performed in visit on 07/06/18  Fecal occult blood, imunochemical([facility])  Result Value Ref Range  Fecal Occult Bld Negative Negative      Assessment & Plan:   Problem List Items Addressed This Visit      Cardiovascular and Mediastinum   Hypertension    The current medical regimen is effective;  continue present plan and medications.       Relevant Medications   aspirin EC 81 MG tablet   amLODipine (NORVASC) 10 MG tablet   hydrochlorothiazide (HYDRODIURIL) 12.5 MG tablet   benazepril (LOTENSIN) 20 MG tablet   atorvastatin (LIPITOR) 20 MG tablet   Other Relevant Orders    CBC with Differential/Platelet   Comprehensive metabolic panel   TSH   Urinalysis, Routine w reflex microscopic     Digestive   Reflux esophagitis    The current medical regimen is effective;  continue present plan and medications.       Relevant Medications   pantoprazole (PROTONIX) 40 MG tablet   Other Relevant Orders   CBC with Differential/Platelet   Comprehensive metabolic panel   TSH     Musculoskeletal and Integument   Cutaneous lupus erythematosus    Followed by dermatology        Other   Hyperlipidemia    The current medical regimen is effective;  continue present plan and medications.       Relevant Medications   aspirin EC 81 MG tablet   amLODipine (NORVASC) 10 MG tablet   hydrochlorothiazide (HYDRODIURIL) 12.5 MG tablet   benazepril (LOTENSIN) 20 MG tablet   atorvastatin (LIPITOR) 20 MG tablet   Other Relevant Orders   CBC with Differential/Platelet   Comprehensive metabolic panel   TSH   Lipid panel   Urinalysis, Routine w reflex microscopic   Depression    The current medical regimen is effective;  continue present plan and medications.       Relevant Medications   buPROPion (WELLBUTRIN SR) 150 MG 12 hr tablet   traZODone (DESYREL) 50 MG tablet   Insomnia    The current medical regimen is effective;  continue present plan and medications.       Relevant Medications   traZODone (DESYREL) 50 MG tablet   Other Relevant Orders   TSH   Advanced care planning/counseling discussion    A voluntary discussion about advanced care planning including explanation and discussion of advanced directives was extentively discussed with the patient.  Explained about the healthcare proxy and living will was reviewed and packet with forms with expiration of how to fill them out was given.  Time spent: Encounter 16+ min individuals present: Patient      Restless leg syndrome    The current medical regimen is effective;  continue present plan and  medications.       Relevant Orders   TSH       Follow up plan: Return in about 6 months (around 02/06/2019) for BMP,  Lipids, ALT, AST.

## 2018-08-08 ENCOUNTER — Encounter: Payer: Self-pay | Admitting: Family Medicine

## 2018-08-08 LAB — COMPREHENSIVE METABOLIC PANEL
A/G RATIO: 2 (ref 1.2–2.2)
ALT: 13 IU/L (ref 0–32)
AST: 18 IU/L (ref 0–40)
Albumin: 4.2 g/dL (ref 3.5–4.8)
Alkaline Phosphatase: 70 IU/L (ref 39–117)
BILIRUBIN TOTAL: 0.4 mg/dL (ref 0.0–1.2)
BUN/Creatinine Ratio: 12 (ref 12–28)
BUN: 13 mg/dL (ref 8–27)
CHLORIDE: 100 mmol/L (ref 96–106)
CO2: 24 mmol/L (ref 20–29)
Calcium: 9.4 mg/dL (ref 8.7–10.3)
Creatinine, Ser: 1.05 mg/dL — ABNORMAL HIGH (ref 0.57–1.00)
GFR calc non Af Amer: 53 mL/min/{1.73_m2} — ABNORMAL LOW (ref >59)
GFR, EST AFRICAN AMERICAN: 61 mL/min/{1.73_m2} (ref >59)
GLOBULIN, TOTAL: 2.1 g/dL (ref 1.5–4.5)
Glucose: 74 mg/dL (ref 65–99)
POTASSIUM: 4.2 mmol/L (ref 3.5–5.2)
SODIUM: 136 mmol/L (ref 134–144)
TOTAL PROTEIN: 6.3 g/dL (ref 6.0–8.5)

## 2018-08-08 LAB — CBC WITH DIFFERENTIAL/PLATELET
Basophils Absolute: 0 10*3/uL (ref 0.0–0.2)
Basos: 1 %
EOS (ABSOLUTE): 0 10*3/uL (ref 0.0–0.4)
Eos: 1 %
Hematocrit: 38 % (ref 34.0–46.6)
Hemoglobin: 12.5 g/dL (ref 11.1–15.9)
IMMATURE GRANS (ABS): 0 10*3/uL (ref 0.0–0.1)
Immature Granulocytes: 0 %
Lymphocytes Absolute: 1.3 10*3/uL (ref 0.7–3.1)
Lymphs: 35 %
MCH: 25.3 pg — AB (ref 26.6–33.0)
MCHC: 32.9 g/dL (ref 31.5–35.7)
MCV: 77 fL — AB (ref 79–97)
MONOS ABS: 0.5 10*3/uL (ref 0.1–0.9)
Monocytes: 12 %
NEUTROS ABS: 1.9 10*3/uL (ref 1.4–7.0)
Neutrophils: 51 %
PLATELETS: 212 10*3/uL (ref 150–450)
RBC: 4.95 x10E6/uL (ref 3.77–5.28)
RDW: 16.2 % — AB (ref 12.3–15.4)
WBC: 3.8 10*3/uL (ref 3.4–10.8)

## 2018-08-08 LAB — LIPID PANEL
CHOL/HDL RATIO: 2 ratio (ref 0.0–4.4)
Cholesterol, Total: 154 mg/dL (ref 100–199)
HDL: 78 mg/dL (ref >39)
LDL Calculated: 67 mg/dL (ref 0–99)
TRIGLYCERIDES: 47 mg/dL (ref 0–149)
VLDL Cholesterol Cal: 9 mg/dL (ref 5–40)

## 2018-08-08 LAB — TSH: TSH: 1.4 u[IU]/mL (ref 0.450–4.500)

## 2018-08-30 DIAGNOSIS — D2261 Melanocytic nevi of right upper limb, including shoulder: Secondary | ICD-10-CM | POA: Diagnosis not present

## 2018-08-30 DIAGNOSIS — D2272 Melanocytic nevi of left lower limb, including hip: Secondary | ICD-10-CM | POA: Diagnosis not present

## 2018-08-30 DIAGNOSIS — D2271 Melanocytic nevi of right lower limb, including hip: Secondary | ICD-10-CM | POA: Diagnosis not present

## 2018-08-30 DIAGNOSIS — L570 Actinic keratosis: Secondary | ICD-10-CM | POA: Diagnosis not present

## 2018-08-30 DIAGNOSIS — Z08 Encounter for follow-up examination after completed treatment for malignant neoplasm: Secondary | ICD-10-CM | POA: Diagnosis not present

## 2018-08-30 DIAGNOSIS — L931 Subacute cutaneous lupus erythematosus: Secondary | ICD-10-CM | POA: Diagnosis not present

## 2018-08-30 DIAGNOSIS — D485 Neoplasm of uncertain behavior of skin: Secondary | ICD-10-CM | POA: Diagnosis not present

## 2018-08-30 DIAGNOSIS — C44311 Basal cell carcinoma of skin of nose: Secondary | ICD-10-CM | POA: Diagnosis not present

## 2018-08-30 DIAGNOSIS — D225 Melanocytic nevi of trunk: Secondary | ICD-10-CM | POA: Diagnosis not present

## 2018-08-30 DIAGNOSIS — Z85828 Personal history of other malignant neoplasm of skin: Secondary | ICD-10-CM | POA: Diagnosis not present

## 2018-08-30 DIAGNOSIS — D2262 Melanocytic nevi of left upper limb, including shoulder: Secondary | ICD-10-CM | POA: Diagnosis not present

## 2018-08-30 DIAGNOSIS — X32XXXA Exposure to sunlight, initial encounter: Secondary | ICD-10-CM | POA: Diagnosis not present

## 2018-08-30 DIAGNOSIS — C44729 Squamous cell carcinoma of skin of left lower limb, including hip: Secondary | ICD-10-CM | POA: Diagnosis not present

## 2018-09-12 DIAGNOSIS — C44729 Squamous cell carcinoma of skin of left lower limb, including hip: Secondary | ICD-10-CM | POA: Diagnosis not present

## 2018-11-01 ENCOUNTER — Ambulatory Visit (INDEPENDENT_AMBULATORY_CARE_PROVIDER_SITE_OTHER): Payer: PPO

## 2018-11-01 VITALS — BP 118/70 | HR 58 | Temp 97.5°F | Resp 16 | Ht 62.0 in | Wt 132.1 lb

## 2018-11-01 DIAGNOSIS — Z780 Asymptomatic menopausal state: Secondary | ICD-10-CM | POA: Diagnosis not present

## 2018-11-01 DIAGNOSIS — Z0000 Encounter for general adult medical examination without abnormal findings: Secondary | ICD-10-CM

## 2018-11-01 NOTE — Patient Instructions (Addendum)
Lindsey Blair , Thank you for taking time to come for your Medicare Wellness Visit. I appreciate your ongoing commitment to your health goals. Please review the following plan we discussed and let me know if I can assist you in the future.   Screening recommendations/referrals: Colonoscopy: completed 06/20/2016 Mammogram: completed 02/22/2018 Bone Density: completed 11/26/2013 Please call [phone] to schedule.  Recommended yearly ophthalmology/optometry visit for glaucoma screening and checkup Recommended yearly dental visit for hygiene and checkup  Vaccinations: Influenza vaccine: up to date Pneumococcal vaccine: up to date  Tdap vaccine: up to date  Shingles vaccine: shingrix eligible, check with your insurance company for coverage   Advanced directives: Please bring a copy of your health care power of attorney and living will to the office at your convenience.  Conditions/risks identified: none   Next appointment: Follow up in one year for your medicare wellness visit.    Preventive Care 1 Years and Older, Female Preventive care refers to lifestyle choices and visits with your health care provider that can promote health and wellness. What does preventive care include?  A yearly physical exam. This is also called an annual well check.  Dental exams once or twice a year.  Routine eye exams. Ask your health care provider how often you should have your eyes checked.  Personal lifestyle choices, including:  Daily care of your teeth and gums.  Regular physical activity.  Eating a healthy diet.  Avoiding tobacco and drug use.  Limiting alcohol use.  Practicing safe sex.  Taking low-dose aspirin every day.  Taking vitamin and mineral supplements as recommended by your health care provider. What happens during an annual well check? The services and screenings done by your health care provider during your annual well check will depend on your age, overall health,  lifestyle risk factors, and family history of disease. Counseling  Your health care provider may ask you questions about your:  Alcohol use.  Tobacco use.  Drug use.  Emotional well-being.  Home and relationship well-being.  Sexual activity.  Eating habits.  History of falls.  Memory and ability to understand (cognition).  Work and work Statistician.  Reproductive health. Screening  You may have the following tests or measurements:  Height, weight, and BMI.  Blood pressure.  Lipid and cholesterol levels. These may be checked every 5 years, or more frequently if you are over 21 years old.  Skin check.  Lung cancer screening. You may have this screening every year starting at age 25 if you have a 30-pack-year history of smoking and currently smoke or have quit within the past 15 years.  Fecal occult blood test (FOBT) of the stool. You may have this test every year starting at age 13.  Flexible sigmoidoscopy or colonoscopy. You may have a sigmoidoscopy every 5 years or a colonoscopy every 10 years starting at age 79.  Hepatitis C blood test.  Hepatitis B blood test.  Sexually transmitted disease (STD) testing.  Diabetes screening. This is done by checking your blood sugar (glucose) after you have not eaten for a while (fasting). You may have this done every 1-3 years.  Bone density scan. This is done to screen for osteoporosis. You may have this done starting at age 30.  Mammogram. This may be done every 1-2 years. Talk to your health care provider about how often you should have regular mammograms. Talk with your health care provider about your test results, treatment options, and if necessary, the need for more tests. Vaccines  Your health care provider may recommend certain vaccines, such as:  Influenza vaccine. This is recommended every year.  Tetanus, diphtheria, and acellular pertussis (Tdap, Td) vaccine. You may need a Td booster every 10 years.  Zoster  vaccine. You may need this after age 66.  Pneumococcal 13-valent conjugate (PCV13) vaccine. One dose is recommended after age 21.  Pneumococcal polysaccharide (PPSV23) vaccine. One dose is recommended after age 70. Talk to your health care provider about which screenings and vaccines you need and how often you need them. This information is not intended to replace advice given to you by your health care provider. Make sure you discuss any questions you have with your health care provider. Document Released: 09/18/2015 Document Revised: 05/11/2016 Document Reviewed: 06/23/2015 Elsevier Interactive Patient Education  2017 [employer] Prevention in the Home Falls can cause injuries. They can happen to people of all ages. There are many things you can do to make your home safe and to help prevent falls. What can I do on the outside of my home?  Regularly fix the edges of walkways and driveways and fix any cracks.  Remove anything that might make you trip as you walk through a door, such as a raised step or threshold.  Trim any bushes or trees on the path to your home.  Use bright outdoor lighting.  Clear any walking paths of anything that might make someone trip, such as rocks or tools.  Regularly check to see if handrails are loose or broken. Make sure that both sides of any steps have handrails.  Any raised decks and porches should have guardrails on the edges.  Have any leaves, snow, or ice cleared regularly.  Use sand or salt on walking paths during winter.  Clean up any spills in your garage right away. This includes oil or grease spills. What can I do in the bathroom?  Use night lights.  Install grab bars by the toilet and in the tub and shower. Do not use towel bars as grab bars.  Use non-skid mats or decals in the tub or shower.  If you need to sit down in the shower, use a plastic, non-slip stool.  Keep the floor dry. Clean up any water that spills on the  floor as soon as it happens.  Remove soap buildup in the tub or shower regularly.  Attach bath mats securely with double-sided non-slip rug tape.  Do not have throw rugs and other things on the floor that can make you trip. What can I do in the bedroom?  Use night lights.  Make sure that you have a light by your bed that is easy to reach.  Do not use any sheets or blankets that are too big for your bed. They should not hang down onto the floor.  Have a firm chair that has side arms. You can use this for support while you get dressed.  Do not have throw rugs and other things on the floor that can make you trip. What can I do in the kitchen?  Clean up any spills right away.  Avoid walking on wet floors.  Keep items that you use a lot in easy-to-reach places.  If you need to reach something above you, use a strong step stool that has a grab bar.  Keep electrical cords out of the way.  Do not use floor polish or wax that makes floors slippery. If you must use wax, use non-skid floor wax.  Do  not have throw rugs and other things on the floor that can make you trip. What can I do with my stairs?  Do not leave any items on the stairs.  Make sure that there are handrails on both sides of the stairs and use them. Fix handrails that are broken or loose. Make sure that handrails are as long as the stairways.  Check any carpeting to make sure that it is firmly attached to the stairs. Fix any carpet that is loose or worn.  Avoid having throw rugs at the top or bottom of the stairs. If you do have throw rugs, attach them to the floor with carpet tape.  Make sure that you have a light switch at the top of the stairs and the bottom of the stairs. If you do not have them, ask someone to add them for you. What else can I do to help prevent falls?  Wear shoes that:  Do not have high heels.  Have rubber bottoms.  Are comfortable and fit you well.  Are closed at the toe. Do not wear  sandals.  If you use a stepladder:  Make sure that it is fully opened. Do not climb a closed stepladder.  Make sure that both sides of the stepladder are locked into place.  Ask someone to hold it for you, if possible.  Clearly mark and make sure that you can see:  Any grab bars or handrails.  First and last steps.  Where the edge of each step is.  Use tools that help you move around (mobility aids) if they are needed. These include:  Canes.  Walkers.  Scooters.  Crutches.  Turn on the lights when you go into a dark area. Replace any light bulbs as soon as they burn out.  Set up your furniture so you have a clear path. Avoid moving your furniture around.  If any of your floors are uneven, fix them.  If there are any pets around you, be aware of where they are.  Review your medicines with your doctor. Some medicines can make you feel dizzy. This can increase your chance of falling. Ask your doctor what other things that you can do to help prevent falls. This information is not intended to replace advice given to you by your health care provider. Make sure you discuss any questions you have with your health care provider. Document Released: 06/18/2009 Document Revised: 01/28/2016 Document Reviewed: 09/26/2014 Elsevier Interactive Patient Education  2017 [employer].

## 2018-11-01 NOTE — Progress Notes (Signed)
Subjective:   Lindsey Blair is a 73 y.o. female who presents for Medicare Annual (Subsequent) preventive examination.  Review of Systems:  Cardiac Risk Factors include: advanced age (>4men, >25 women);hypertension;dyslipidemia     Objective:     Vitals: BP 118/70 (BP Location: Left Arm, Patient Position: Sitting, Cuff Size: Normal)   Pulse (!) 58   Temp (!) 97.5 F (36.4 C) (Temporal)   Resp 16   Ht 5\' 2"  (1.575 m)   Wt 132 lb 1.6 oz (59.9 kg)   BMI 24.16 kg/m   Body mass index is 24.16 kg/m.  Advanced Directives 11/01/2018 02/17/2017 10/22/2015  Does Patient Have a Medical Advance Directive? Yes No No  Type of Paramedic of [place];Living will - -  Copy of [place] in Chart? No - copy requested - -  Would patient like information on creating a medical advance directive? - Yes (MAU/Ambulatory/Procedural Areas - Information given) No - patient declined information    Tobacco Social History   Tobacco Use  Smoking Status Former Smoker  . Types: Cigarettes  . Last attempt to quit: 03/08/1965  . Years since quitting: 53.6  Smokeless Tobacco Never Used     Counseling given: Not Answered   Clinical Intake:  Pre-visit preparation completed: Yes  Pain : No/denies pain     Nutritional Status: BMI of 19-24  Normal Nutritional Risks: None Diabetes: No  How often do you need to have someone help you when you read instructions, pamphlets, or other written materials from your doctor or pharmacy?: 1 - Never What is the last grade level you completed in school?: some college   Interpreter Needed?: No  Information entered by ::  ,LPN   Past Medical History:  Diagnosis Date  . Anxiety   . Cancer [facility]) Several instances of the years   Skin  . Chronic kidney disease   . Cutaneous lupus erythematosus   . Depression   . Hyperlipidemia   . Hypertension   . Vertigo    rare   Past Surgical History:  Procedure  Laterality Date  . BREAST BIOPSY Right 2000   Negative  . COLONOSCOPY WITH PROPOFOL N/A 10/22/2015   Procedure: COLONOSCOPY WITH PROPOFOL;  Surgeon: Lucilla Lame, MD;  Location: [place];  Service: Endoscopy;  Laterality: N/A;  . ESOPHAGOGASTRODUODENOSCOPY (EGD) WITH PROPOFOL N/A 10/22/2015   Procedure: ESOPHAGOGASTRODUODENOSCOPY (EGD) WITH PROPOFOL;  Surgeon: Lucilla Lame, MD;  Location: [place];  Service: Endoscopy;  Laterality: N/A;  . IMAGE GUIDED SINUS SURGERY  04/06/11   [facility], Dr. Pryor Ochoa  . MASTOIDECTOMY     Family History  Problem Relation Age of Onset  . Heart disease Mother   . Leukemia Father   . Stroke Father   . Cerebral aneurysm Sister   . Prostate cancer Brother   . Prostate cancer Brother   . Prostate cancer Brother   . Lung cancer Sister   . Bladder Cancer Sister   . Liver cancer Sister   . Brain cancer Sister   . Heart attack Sister   . Breast cancer Neg Hx    Social History   Socioeconomic History  . Marital status: Married    Spouse name: Not on file  . Number of children: Not on file  . Years of education: Not on file  . Highest education level: Some college, no degree  Occupational History  . Occupation: retired   Scientific laboratory technician  . Financial resource strain: Not hard  at all  . Food insecurity:    Worry: Never true    Inability: Never true  . Transportation needs:    Medical: No    Non-medical: No  Tobacco Use  . Smoking status: Former Smoker    Types: Cigarettes    Last attempt to quit: 03/08/1965    Years since quitting: 53.6  . Smokeless tobacco: Never Used  Substance and Sexual Activity  . Alcohol use: Yes    Alcohol/week: 5.0 standard drinks    Types: 2 Glasses of wine, 3 Shots of liquor per week  . Drug use: No  . Sexual activity: Not on file  Lifestyle  . Physical activity:    Days per week: 0 days    Minutes per session: 0 min  . Stress: Not at all  Relationships  . Social connections:    Talks on phone: More  than three times a week    Gets together: More than three times a week    Attends religious service: More than 4 times per year    Active member of club or organization: No    Attends meetings of clubs or organizations: Never    Relationship status: Married  Other Topics Concern  . Not on file  Social History Narrative  . Not on file    Outpatient Encounter Medications as of 11/01/2018  Medication Sig  . amLODipine (NORVASC) 10 MG tablet Take 1 tablet (10 mg total) by mouth daily.  Marland Kitchen aspirin EC 81 MG tablet Take 81 mg by mouth daily.  Marland Kitchen atorvastatin (LIPITOR) 20 MG tablet Take 1 tablet (20 mg total) by mouth daily.  . benazepril (LOTENSIN) 20 MG tablet Take 1 tablet (20 mg total) by mouth daily.  Marland Kitchen buPROPion (WELLBUTRIN SR) 150 MG 12 hr tablet Take 1 tablet (150 mg total) by mouth 2 (two) times daily.  . hydrochlorothiazide (HYDRODIURIL) 12.5 MG tablet Take 1 tablet (12.5 mg total) by mouth daily.  . hydroxychloroquine (PLAQUENIL) 200 MG tablet Take 200 mg by mouth daily.  . Multiple Vitamin (MULTI-VITAMIN DAILY PO) Take by mouth.  . pantoprazole (PROTONIX) 40 MG tablet Take 1 tablet (40 mg total) by mouth daily.  . pregabalin (LYRICA) 50 MG capsule TAKE (1) CAPSULE THREE TIMES DAILY.  . traZODone (DESYREL) 50 MG tablet Take 1 tablet (50 mg total) by mouth at bedtime.  . Turmeric 500 MG TABS Take 2,000 mg by mouth.   No facility-administered encounter medications on file as of 11/01/2018.     Activities of Daily Living In your present state of health, do you have any difficulty performing the following activities: 11/01/2018 08/07/2018  Hearing? N Y  Comment mostly with background noise  -  Vision? N N  Comment needs to schedule cataract surgery for right eye -  Difficulty concentrating or making decisions? Tempie Donning  Comment typically comes back  -  Walking or climbing stairs? N N  Dressing or bathing? N N  Doing errands, shopping? N N  Preparing Food and eating ? N -  Using the  Toilet? N -  In the past six months, have you accidently leaked urine? N -  Do you have problems with loss of bowel control? N -  Managing your Medications? N -  Managing your Finances? N -  Housekeeping or managing your Housekeeping? N -  Some recent data might be hidden    Patient Care Team: Guadalupe Maple, MD as PCP - General (Family Medicine) Lucilla Lame, MD as Consulting  Physician (Gastroenterology) Lavonia Dana, MD as Consulting Physician (Internal Medicine) Emmaline Kluver., MD (Rheumatology)    Assessment:   This is a routine wellness examination for Tamyia.  Exercise Activities and Dietary recommendations Current Exercise Habits: The patient does not participate in regular exercise at present(rides horses, no exercise routine), Exercise limited by: None identified  Goals    . Increase water intake     Recommend drinking at least 3-4 glasses of water a day        Fall Risk Fall Risk  11/01/2018 08/07/2018 05/14/2018 06/12/2017 03/06/2017  Falls in the past year? 0 0 No No No  Number falls in past yr: - 0 - - -  Injury with Fall? - 0 - - -   FALL RISK PREVENTION PERTAINING TO THE HOME:  Any stairs in or around the home? Yes  If so, are there any without handrails? No   Home free of loose throw rugs in walkways, pet beds, electrical cords, etc? Yes  Adequate lighting in your home to reduce risk of falls? Yes   ASSISTIVE DEVICES UTILIZED TO PREVENT FALLS:  Life alert? No  Use of a cane, walker or w/c? No  Grab bars in the bathroom? No  Shower chair or bench in shower? No  Elevated toilet seat or a handicapped toilet? No   DME ORDERS:  DME order needed?  No   TIMED UP AND GO:  Was the test performed? No .  Length of time to ambulate 10 feet: 11 sec.   GAIT:  Appearance of gait: Gait stead-fast without the use of an assistive device.  Education: Fall risk prevention has been discussed.  Intervention(s) required? No   Depression Screen PHQ 2/9  Scores 11/01/2018 08/07/2018 05/14/2018 06/12/2017  PHQ - 2 Score 0 0 0 0  PHQ- 9 Score - 0 1 -     Cognitive Function     6CIT Screen 02/17/2017  What Year? 0 points  What month? 0 points  What time? 0 points  Count back from 20 0 points  Months in reverse 0 points  Repeat phrase 0 points  Total Score 0    Immunization History  Administered Date(s) Administered  . Influenza, High Dose Seasonal PF 05/14/2018  . Influenza-Unspecified 06/11/2015, 06/24/2016, 06/08/2017  . Pneumococcal Conjugate-13 04/09/2014  . Pneumococcal-Unspecified 10/03/2011  . Td 08/25/2005, 10/08/2015  . Zoster 08/27/2008    Qualifies for Shingles Vaccine? Yes  Zostavax completed 08/27/2008. Due for Shingrix. Education has been provided regarding the importance of this vaccine. Pt has been advised to call insurance company to determine out of pocket expense. Advised may also receive vaccine at local pharmacy or Health Dept. Verbalized acceptance and understanding.  Tdap: up to date  Flu Vaccine: up to date   Pneumococcal Vaccine: up to date   Screening Tests Health Maintenance  Topic Date Due  . MAMMOGRAM  02/23/2020  . TETANUS/TDAP  10/07/2025  . COLONOSCOPY  06/20/2026  . INFLUENZA VACCINE  Completed  . DEXA SCAN  Completed  . Hepatitis C Screening  Completed  . PNA vac Low Risk Adult  Completed   Cancer Screenings:  Colorectal Screening: Completed 06/20/2016. Repeat every 10 years  Mammogram: Completed 02/22/2018. Repeat every year  Bone Density: Completed 11/26/2013.   Lung Cancer Screening: (Low Dose CT Chest recommended if Age 28-80 years, 30 pack-year currently smoking OR have quit w/in 15years.) does not qualify.   Lung Cancer Screening Referral: An Epic message has been sent to [facility]  Dara Lords, RN (Oncology Art therapist) regarding the possible need for this exam. Raquel Sarna will review the patient's chart to determine if the patient truly qualifies for the exam. If the patient qualifies,  Raquel Sarna will order the Low Dose CT of the chest to facilitate the scheduling of this exam.  Additional Screening:  Hepatitis C Screening: does qualify; Completed 01/06/2016  Vision Screening: Recommended annual ophthalmology exams for early detection of glaucoma and other disorders of the eye. Is the patient up to date with their annual eye exam?  Yes  Who is the provider or what is the name of the office in which the pt attends annual eye exams? [facility]    Dental Screening: Recommended annual dental exams for proper oral hygiene  Community Resource Referral:  CRR required this visit?  No     Plan:    I have personally reviewed and addressed the Medicare Annual Wellness questionnaire and have noted the following in the patient's chart:  A. Medical and social history B. Use of alcohol, tobacco or illicit drugs  C. Current medications and supplements D. Functional ability and status E.  Nutritional status F.  Physical activity G. Advance directives H. List of other physicians I.  Hospitalizations, surgeries, and ER visits in previous 12 months J.  [place] such as hearing and vision if needed, cognitive and depression L. Referrals and appointments   In addition, I have reviewed and discussed with patient certain preventive protocols, quality metrics, and best practice recommendations. A written personalized care plan for preventive services as well as general preventive health recommendations were provided to patient.   Signed,  Tyler Aas, LPN Nurse Health Advisor   Nurse Notes:none

## 2018-11-26 ENCOUNTER — Other Ambulatory Visit: Payer: Self-pay | Admitting: Family Medicine

## 2018-11-26 MED ORDER — TESTOSTERONE 1.62 % TD GEL: 1.0000 "application " | TRANSDERMAL | 5 refills | Status: DC

## 2018-12-18 DIAGNOSIS — F17200 Nicotine dependence, unspecified, uncomplicated: Secondary | ICD-10-CM | POA: Diagnosis not present

## 2018-12-18 DIAGNOSIS — C44311 Basal cell carcinoma of skin of nose: Secondary | ICD-10-CM | POA: Diagnosis not present

## 2018-12-18 DIAGNOSIS — J449 Chronic obstructive pulmonary disease, unspecified: Secondary | ICD-10-CM | POA: Diagnosis not present

## 2018-12-18 DIAGNOSIS — R0602 Shortness of breath: Secondary | ICD-10-CM | POA: Diagnosis not present

## 2018-12-18 DIAGNOSIS — Z79899 Other long term (current) drug therapy: Secondary | ICD-10-CM | POA: Diagnosis not present

## 2018-12-18 DIAGNOSIS — L931 Subacute cutaneous lupus erythematosus: Secondary | ICD-10-CM | POA: Diagnosis not present

## 2019-01-17 ENCOUNTER — Other Ambulatory Visit: Payer: Self-pay | Admitting: Family Medicine

## 2019-01-17 DIAGNOSIS — Z1231 Encounter for screening mammogram for malignant neoplasm of breast: Secondary | ICD-10-CM

## 2019-02-11 DIAGNOSIS — N183 Chronic kidney disease, stage 3 (moderate): Secondary | ICD-10-CM | POA: Diagnosis not present

## 2019-02-11 DIAGNOSIS — M329 Systemic lupus erythematosus, unspecified: Secondary | ICD-10-CM | POA: Diagnosis not present

## 2019-02-11 DIAGNOSIS — I129 Hypertensive chronic kidney disease with stage 1 through stage 4 chronic kidney disease, or unspecified chronic kidney disease: Secondary | ICD-10-CM | POA: Diagnosis not present

## 2019-02-11 DIAGNOSIS — R319 Hematuria, unspecified: Secondary | ICD-10-CM | POA: Diagnosis not present

## 2019-02-12 ENCOUNTER — Encounter: Payer: Self-pay | Admitting: Family Medicine

## 2019-02-12 ENCOUNTER — Ambulatory Visit (INDEPENDENT_AMBULATORY_CARE_PROVIDER_SITE_OTHER): Payer: PPO | Admitting: Family Medicine

## 2019-02-12 ENCOUNTER — Other Ambulatory Visit: Payer: Self-pay

## 2019-02-12 DIAGNOSIS — E7800 Pure hypercholesterolemia, unspecified: Secondary | ICD-10-CM | POA: Diagnosis not present

## 2019-02-12 DIAGNOSIS — L932 Other local lupus erythematosus: Secondary | ICD-10-CM

## 2019-02-12 DIAGNOSIS — I10 Essential (primary) hypertension: Secondary | ICD-10-CM

## 2019-02-12 DIAGNOSIS — F3342 Major depressive disorder, recurrent, in full remission: Secondary | ICD-10-CM | POA: Diagnosis not present

## 2019-02-12 MED ORDER — TESTOSTERONE 1.62 % TD GEL: 1.0000 "application " | TRANSDERMAL | 5 refills | Status: DC

## 2019-02-12 NOTE — Assessment & Plan Note (Signed)
The current medical regimen is effective;  continue present plan and medications.  

## 2019-02-12 NOTE — Progress Notes (Signed)
BP 125/76    Subjective:    Patient ID: Lindsey Blair, female    DOB: 09-01-1946, 73 y.o.   MRN: [id number]  HPI: Lindsey Blair is a 73 y.o. female  Med check  Telemedicine using audio/video telecommunications for a synchronous communication visit. Today's visit due to COVID-19 isolation precautions I connected with and verified that I am speaking with the correct person using two identifiers.   I discussed the limitations, risks, security and privacy concerns of performing an evaluation and management service by telecommunication and the availability of in person appointments. I also discussed with the patient that there may be a patient responsible charge related to this service. The patient expressed understanding and agreed to proceed. The patient's location is home. I am at home.  Patient all in all doing well no complaints having trouble finding hydroxychloroquine for her lupus as prescribed by dermatology dermatology also doing lab work which is reported as normal. Otherwise doing well with no complaints from medications which were reviewed and medical problems reviewed and stable.  Relevant past medical, surgical, family and social history reviewed and updated as indicated. Interim medical history since our last visit reviewed. Allergies and medications reviewed and updated.  Review of Systems  Constitutional: Negative.   Respiratory: Negative.   Cardiovascular: Negative.     Per HPI unless specifically indicated above     Objective:    BP 125/76   Wt Readings from Last 3 Encounters:  11/01/18 132 lb 1.6 oz (59.9 kg)  08/07/18 125 lb 12.8 oz (57.1 kg)  05/14/18 131 lb 5 oz (59.6 kg)    Physical Exam  Results for orders placed or performed in visit on 08/07/18  Microscopic Examination  Result Value Ref Range   WBC, UA 0-5 0 - 5 /hpf   RBC, UA None seen 0 - 2 /hpf   Epithelial Cells (non renal) 0-10 0 - 10 /hpf   Bacteria, UA None seen None seen/Few  CBC  with Differential/Platelet  Result Value Ref Range   WBC 3.8 3.4 - 10.8 x10E3/uL   RBC 4.95 3.77 - 5.28 x10E6/uL   Hemoglobin 12.5 11.1 - 15.9 g/dL   Hematocrit 38.0 34.0 - 46.6 %   MCV 77 (L) 79 - 97 fL   MCH 25.3 (L) 26.6 - 33.0 pg   MCHC 32.9 31.5 - 35.7 g/dL   RDW 16.2 (H) 12.3 - 15.4 %   Platelets 212 150 - 450 x10E3/uL   Neutrophils 51 Not Estab. %   Lymphs 35 Not Estab. %   Monocytes 12 Not Estab. %   Eos 1 Not Estab. %   Basos 1 Not Estab. %   Neutrophils Absolute 1.9 1.4 - 7.0 x10E3/uL   Lymphocytes Absolute 1.3 0.7 - 3.1 x10E3/uL   Monocytes Absolute 0.5 0.1 - 0.9 x10E3/uL   EOS (ABSOLUTE) 0.0 0.0 - 0.4 x10E3/uL   Basophils Absolute 0.0 0.0 - 0.2 x10E3/uL   Immature Granulocytes 0 Not Estab. %   Immature Grans (Abs) 0.0 0.0 - 0.1 x10E3/uL  Comprehensive metabolic panel  Result Value Ref Range   Glucose 74 65 - 99 mg/dL   BUN 13 8 - 27 mg/dL   Creatinine, Ser 1.05 (H) 0.57 - 1.00 mg/dL   GFR calc non Af Amer 53 (L) >59 mL/min/1.73   GFR calc Af Amer 61 >59 mL/min/1.73   BUN/Creatinine Ratio 12 12 - 28   Sodium 136 134 - 144 mmol/L   Potassium 4.2 3.5 - 5.2  mmol/L   Chloride 100 96 - 106 mmol/L   CO2 24 20 - 29 mmol/L   Calcium 9.4 8.7 - 10.3 mg/dL   Total Protein 6.3 6.0 - 8.5 g/dL   Albumin 4.2 3.5 - 4.8 g/dL   Globulin, Total 2.1 1.5 - 4.5 g/dL   Albumin/Globulin Ratio 2.0 1.2 - 2.2   Bilirubin Total 0.4 0.0 - 1.2 mg/dL   Alkaline Phosphatase 70 39 - 117 IU/L   AST 18 0 - 40 IU/L   ALT 13 0 - 32 IU/L  TSH  Result Value Ref Range   TSH 1.400 0.450 - 4.500 uIU/mL  Lipid panel  Result Value Ref Range   Cholesterol, Total 154 100 - 199 mg/dL   Triglycerides 47 0 - 149 mg/dL   HDL 78 >39 mg/dL   VLDL Cholesterol Cal 9 5 - 40 mg/dL   LDL Calculated 67 0 - 99 mg/dL   Chol/HDL Ratio 2.0 0.0 - 4.4 ratio  Urinalysis, Routine w reflex microscopic  Result Value Ref Range   Specific Gravity, UA 1.015 1.005 - 1.030   pH, UA 7.0 5.0 - 7.5   Color, UA Yellow  Yellow   Appearance Ur Clear Clear   Leukocytes, UA 1+ (A) Negative   Protein, UA Negative Negative/Trace   Glucose, UA Negative Negative   Ketones, UA Negative Negative   RBC, UA Negative Negative   Bilirubin, UA Negative Negative   Urobilinogen, Ur 0.2 0.2 - 1.0 mg/dL   Nitrite, UA Negative Negative   Microscopic Examination See below:       Assessment & Plan:   Problem List Items Addressed This Visit      Cardiovascular and Mediastinum   Hypertension    The current medical regimen is effective;  continue present plan and medications.         Musculoskeletal and Integument   Cutaneous lupus erythematosus    The current medical regimen is effective;  continue present plan and medications.         Other   Hyperlipidemia    The current medical regimen is effective;  continue present plan and medications.       Depression    The current medical regimen is effective;  continue present plan and medications.         I discussed the assessment and treatment plan with the patient. The patient was provided an opportunity to ask questions and all were answered. The patient agreed with the plan and demonstrated an understanding of the instructions.   The patient was advised to call back or seek an in-person evaluation if the symptoms worsen or if the condition fails to improve as anticipated.   I provided 21+ minutes of time during this encounter.  Follow up plan: Return in about 6 months (around 08/14/2019) for Physical Exam.

## 2019-02-13 DIAGNOSIS — L988 Other specified disorders of the skin and subcutaneous tissue: Secondary | ICD-10-CM | POA: Diagnosis not present

## 2019-02-13 DIAGNOSIS — C44311 Basal cell carcinoma of skin of nose: Secondary | ICD-10-CM | POA: Diagnosis not present

## 2019-02-13 DIAGNOSIS — L578 Other skin changes due to chronic exposure to nonionizing radiation: Secondary | ICD-10-CM | POA: Diagnosis not present

## 2019-02-13 DIAGNOSIS — L814 Other melanin hyperpigmentation: Secondary | ICD-10-CM | POA: Diagnosis not present

## 2019-02-28 ENCOUNTER — Ambulatory Visit
Admission: RE | Admit: 2019-02-28 | Discharge: 2019-02-28 | Disposition: A | Discharge status: Home / Self Care | Payer: PPO | Source: Ambulatory Visit | Attending: Family Medicine | Admitting: Family Medicine

## 2019-02-28 ENCOUNTER — Other Ambulatory Visit: Payer: Self-pay

## 2019-02-28 DIAGNOSIS — Z780 Asymptomatic menopausal state: Secondary | ICD-10-CM | POA: Diagnosis not present

## 2019-02-28 DIAGNOSIS — Z1231 Encounter for screening mammogram for malignant neoplasm of breast: Secondary | ICD-10-CM | POA: Diagnosis not present

## 2019-02-28 DIAGNOSIS — M8589 Other specified disorders of bone density and structure, multiple sites: Secondary | ICD-10-CM | POA: Diagnosis not present

## 2019-03-04 ENCOUNTER — Other Ambulatory Visit: Payer: Self-pay | Admitting: Family Medicine

## 2019-03-04 MED ORDER — ALENDRONATE SODIUM 35 MG PO TABS: 35.0000 mg | ORAL_TABLET | ORAL | 4 refills | Status: DC

## 2019-03-04 NOTE — Progress Notes (Signed)
Discussed osteopenia with patient will start Fosamax

## 2019-04-30 DIAGNOSIS — L570 Actinic keratosis: Secondary | ICD-10-CM | POA: Diagnosis not present

## 2019-04-30 DIAGNOSIS — D225 Melanocytic nevi of trunk: Secondary | ICD-10-CM | POA: Diagnosis not present

## 2019-04-30 DIAGNOSIS — D2261 Melanocytic nevi of right upper limb, including shoulder: Secondary | ICD-10-CM | POA: Diagnosis not present

## 2019-04-30 DIAGNOSIS — D2262 Melanocytic nevi of left upper limb, including shoulder: Secondary | ICD-10-CM | POA: Diagnosis not present

## 2019-04-30 DIAGNOSIS — Z79899 Other long term (current) drug therapy: Secondary | ICD-10-CM | POA: Diagnosis not present

## 2019-04-30 DIAGNOSIS — L820 Inflamed seborrheic keratosis: Secondary | ICD-10-CM | POA: Diagnosis not present

## 2019-04-30 DIAGNOSIS — Z08 Encounter for follow-up examination after completed treatment for malignant neoplasm: Secondary | ICD-10-CM | POA: Diagnosis not present

## 2019-04-30 DIAGNOSIS — Z85828 Personal history of other malignant neoplasm of skin: Secondary | ICD-10-CM | POA: Diagnosis not present

## 2019-04-30 DIAGNOSIS — X32XXXA Exposure to sunlight, initial encounter: Secondary | ICD-10-CM | POA: Diagnosis not present

## 2019-04-30 DIAGNOSIS — D485 Neoplasm of uncertain behavior of skin: Secondary | ICD-10-CM | POA: Diagnosis not present

## 2019-04-30 DIAGNOSIS — L538 Other specified erythematous conditions: Secondary | ICD-10-CM | POA: Diagnosis not present

## 2019-06-25 DIAGNOSIS — Z79899 Other long term (current) drug therapy: Secondary | ICD-10-CM | POA: Diagnosis not present

## 2019-06-25 DIAGNOSIS — Z961 Presence of intraocular lens: Secondary | ICD-10-CM | POA: Diagnosis not present

## 2019-06-25 DIAGNOSIS — H2510 Age-related nuclear cataract, unspecified eye: Secondary | ICD-10-CM | POA: Diagnosis not present

## 2019-07-11 DIAGNOSIS — Z01818 Encounter for other preprocedural examination: Secondary | ICD-10-CM | POA: Diagnosis not present

## 2019-07-11 DIAGNOSIS — H2511 Age-related nuclear cataract, right eye: Secondary | ICD-10-CM | POA: Diagnosis not present

## 2019-08-05 DIAGNOSIS — H25811 Combined forms of age-related cataract, right eye: Secondary | ICD-10-CM | POA: Diagnosis not present

## 2019-08-05 DIAGNOSIS — H2511 Age-related nuclear cataract, right eye: Secondary | ICD-10-CM | POA: Diagnosis not present

## 2019-08-05 HISTORY — PX: CATARACT EXTRACTION: SUR2

## 2019-08-05 HISTORY — PX: CATARACT EXTRACTION W/ INTRAOCULAR LENS IMPLANT: SHX1309

## 2019-08-09 ENCOUNTER — Other Ambulatory Visit: Payer: Self-pay

## 2019-08-11 ENCOUNTER — Encounter: Payer: Self-pay | Admitting: Nurse Practitioner

## 2019-08-11 DIAGNOSIS — N183 Chronic kidney disease, stage 3 unspecified: Secondary | ICD-10-CM | POA: Insufficient documentation

## 2019-08-11 DIAGNOSIS — M329 Systemic lupus erythematosus, unspecified: Secondary | ICD-10-CM | POA: Insufficient documentation

## 2019-08-12 ENCOUNTER — Ambulatory Visit (INDEPENDENT_AMBULATORY_CARE_PROVIDER_SITE_OTHER): Payer: PPO | Admitting: Nurse Practitioner

## 2019-08-12 ENCOUNTER — Other Ambulatory Visit: Payer: Self-pay

## 2019-08-12 ENCOUNTER — Encounter: Payer: Self-pay | Admitting: Nurse Practitioner

## 2019-08-12 VITALS — BP 122/78 | HR 61 | Temp 98.4°F | Ht 61.9 in | Wt 126.0 lb

## 2019-08-12 DIAGNOSIS — G4700 Insomnia, unspecified: Secondary | ICD-10-CM | POA: Diagnosis not present

## 2019-08-12 DIAGNOSIS — M85852 Other specified disorders of bone density and structure, left thigh: Secondary | ICD-10-CM

## 2019-08-12 DIAGNOSIS — Z0000 Encounter for general adult medical examination without abnormal findings: Secondary | ICD-10-CM | POA: Diagnosis not present

## 2019-08-12 DIAGNOSIS — M85851 Other specified disorders of bone density and structure, right thigh: Secondary | ICD-10-CM

## 2019-08-12 DIAGNOSIS — N1831 Chronic kidney disease, stage 3a: Secondary | ICD-10-CM | POA: Diagnosis not present

## 2019-08-12 DIAGNOSIS — K2100 Gastro-esophageal reflux disease with esophagitis, without bleeding: Secondary | ICD-10-CM | POA: Diagnosis not present

## 2019-08-12 DIAGNOSIS — M329 Systemic lupus erythematosus, unspecified: Secondary | ICD-10-CM | POA: Diagnosis not present

## 2019-08-12 DIAGNOSIS — E7800 Pure hypercholesterolemia, unspecified: Secondary | ICD-10-CM | POA: Diagnosis not present

## 2019-08-12 DIAGNOSIS — G2581 Restless legs syndrome: Secondary | ICD-10-CM

## 2019-08-12 DIAGNOSIS — F3342 Major depressive disorder, recurrent, in full remission: Secondary | ICD-10-CM | POA: Diagnosis not present

## 2019-08-12 DIAGNOSIS — I10 Essential (primary) hypertension: Secondary | ICD-10-CM | POA: Diagnosis not present

## 2019-08-12 DIAGNOSIS — Z20828 Contact with and (suspected) exposure to other viral communicable diseases: Secondary | ICD-10-CM | POA: Diagnosis not present

## 2019-08-12 DIAGNOSIS — Z20822 Contact with and (suspected) exposure to covid-19: Secondary | ICD-10-CM

## 2019-08-12 DIAGNOSIS — M8580 Other specified disorders of bone density and structure, unspecified site: Secondary | ICD-10-CM | POA: Insufficient documentation

## 2019-08-12 MED ORDER — PANTOPRAZOLE SODIUM 40 MG PO TBEC: DELAYED_RELEASE_TABLET | ORAL | 4 refills | Status: DC

## 2019-08-12 MED ORDER — AMLODIPINE BESY-BENAZEPRIL HCL 10-20 MG PO CAPS: 1.0000 | ORAL_CAPSULE | Freq: Every day | ORAL | 3 refills | Status: DC

## 2019-08-12 MED ORDER — ALENDRONATE SODIUM 35 MG PO TABS: 35.0000 mg | ORAL_TABLET | ORAL | 4 refills | Status: DC

## 2019-08-12 MED ORDER — TRAZODONE HCL 50 MG PO TABS: 50.0000 mg | ORAL_TABLET | Freq: Every day | ORAL | 4 refills | Status: DC

## 2019-08-12 MED ORDER — ATORVASTATIN CALCIUM 20 MG PO TABS: 20.0000 mg | ORAL_TABLET | Freq: Every day | ORAL | 4 refills | Status: DC

## 2019-08-12 MED ORDER — BUPROPION HCL ER (SR) 150 MG PO TB12: 150.0000 mg | ORAL_TABLET | Freq: Two times a day (BID) | ORAL | 4 refills | Status: DC

## 2019-08-12 NOTE — Assessment & Plan Note (Signed)
Chronic, stable.  Continue daily Protonix and attempt reduction to discontinuation in future if tolerated.  Mag level annually.

## 2019-08-12 NOTE — Progress Notes (Signed)
BP 122/78 (BP Location: Left Arm, Patient Position: Sitting)   Pulse 61   Temp 98.4 F (36.9 C) (Oral)   Ht 5' 1.9" (1.572 m)   Wt 126 lb (57.2 kg)   SpO2 98%   BMI 23.12 kg/m    Subjective:    Patient ID: Lindsey Blair, female    DOB: 06/18/1946, 73 y.o.   MRN: [id number]  HPI: BERNELL ZUPKO is a 73 y.o. female presenting on 08/12/2019 for comprehensive medical examination. Current medical complaints include:none  She currently lives with: husband Menopausal Symptoms: no   HYPERTENSION / HYPERLIPIDEMIA/CKD 3 Continues on Amlodipine, Benazepril, and Lipitor daily.  Followed by nephrology and last seen by Dr. Abigail Butts 02/11/19.   Satisfied with current treatment? yes Duration of hypertension: chronic BP monitoring frequency: daily BP range: 133/73 this morning, 120-130/70-80 BP medication side effects: no Duration of hyperlipidemia: chronic Cholesterol medication side effects: no Cholesterol supplements: none Medication compliance: good compliance Aspirin: no Recent stressors: no Recurrent headaches: no Visual changes: no Palpitations: no Dyspnea: no Chest pain: no Lower extremity edema: no Dizzy/lightheaded: no  The 10-year ASCVD risk score Mikey Bussing DC Jr., et al., 2013) is: 16.7%   Values used to calculate the score:     Age: 37 years     Sex: Female     Is Non-Hispanic African American: No     Diabetic: No     Tobacco smoker: No     Systolic Blood Pressure: 123XX123 mmHg     Is BP treated: Yes     HDL Cholesterol: 78 mg/dL     Total Cholesterol: 154 mg/dL   SYSTEMIC LUPUS ERYTHEMATOSUS: Is followed by Dr. Evorn Gong for this.  Last saw him August 2020.  Has no concerns about this at this time.    GERD Continues on Protonix daily. GERD control status: stable  Satisfied with current treatment? yes Heartburn frequency:  Medication side effects: no  Medication compliance: stable Dysphagia: no Odynophagia:  no Hematemesis: no Blood in stool: no EGD: no    RESTLESS LEGS Was taking Lyrica daily.  Reports this has improved and has not taken Lyrica for several months.  No further concerns with this.   DEPRESSION Continues on Trazodone, which benefits her insomnia and mood, plus Wellbutrin daily. Mood status: stable Satisfied with current treatment?: yes Symptom severity: mild  Duration of current treatment : chronic Side effects: no Medication compliance: good compliance Psychotherapy/counseling: none Depressed mood: no Anxious mood: no Anhedonia: no Significant weight loss or gain: no Insomnia: none Fatigue: no Feelings of worthlessness or guilt: no Impaired concentration/indecisiveness: no Suicidal ideations: no Hopelessness: no Crying spells: no Depression screen [facility] 2/9 08/12/2019 11/01/2018 08/07/2018 05/14/2018 06/12/2017  Decreased Interest 0 0 0 0 0  Down, Depressed, Hopeless 0 0 0 0 0  PHQ - 2 Score 0 0 0 0 0  Altered sleeping 1 - 0 1 -  Tired, decreased energy 0 - 0 0 -  Change in appetite 0 - 0 0 -  Feeling bad or failure about yourself  0 - 0 0 -  Trouble concentrating 0 - 0 0 -  Moving slowly or fidgety/restless 0 - 0 0 -  Suicidal thoughts 0 - 0 0 -  PHQ-9 Score 1 - 0 1 -  Difficult doing work/chores Not difficult at all - Not difficult at all Not difficult at all -   Depression Screen done today and results listed below:  Depression screen [facility] 2/9 08/12/2019 11/01/2018 08/07/2018 05/14/2018 06/12/2017  Decreased Interest 0 0 0 0 0  Down, Depressed, Hopeless 0 0 0 0 0  PHQ - 2 Score 0 0 0 0 0  Altered sleeping 1 - 0 1 -  Tired, decreased energy 0 - 0 0 -  Change in appetite 0 - 0 0 -  Feeling bad or failure about yourself  0 - 0 0 -  Trouble concentrating 0 - 0 0 -  Moving slowly or fidgety/restless 0 - 0 0 -  Suicidal thoughts 0 - 0 0 -  PHQ-9 Score 1 - 0 1 -  Difficult doing work/chores Not difficult at all - Not difficult at all Not difficult at all -    The patient does not have a history of falls. I did not  complete a risk assessment for falls. A plan of care for falls was not documented.   Past Medical History:  Past Medical History:  Diagnosis Date  . Anxiety   . Cancer [facility]) Several instances of the years   Skin  . Chronic kidney disease   . Cutaneous lupus erythematosus   . Depression   . Hyperlipidemia   . Hypertension   . Vertigo    rare    Surgical History:  Past Surgical History:  Procedure Laterality Date  . BREAST BIOPSY Right 2000   Negative  . CATARACT EXTRACTION Right 08/05/2019  . COLONOSCOPY WITH PROPOFOL N/A 10/22/2015   Procedure: COLONOSCOPY WITH PROPOFOL;  Surgeon: Lucilla Lame, MD;  Location: [place];  Service: Endoscopy;  Laterality: N/A;  . ESOPHAGOGASTRODUODENOSCOPY (EGD) WITH PROPOFOL N/A 10/22/2015   Procedure: ESOPHAGOGASTRODUODENOSCOPY (EGD) WITH PROPOFOL;  Surgeon: Lucilla Lame, MD;  Location: [place];  Service: Endoscopy;  Laterality: N/A;  . IMAGE GUIDED SINUS SURGERY  04/06/11   [facility], Dr. Pryor Ochoa  . MASTOIDECTOMY      Medications:  Current Outpatient Medications on File Prior to Visit  Medication Sig  . hydroxychloroquine (PLAQUENIL) 200 MG tablet Take 200 mg by mouth daily.  . Multiple Vitamin (MULTI-VITAMIN DAILY PO) Take by mouth.  . Testosterone 1.62 % GEL Place 1 application onto the skin 3 (three) times a week. I am unable to make the computer do a 4% gel but please use  . Turmeric 500 MG TABS Take 2,000 mg by mouth.   No current facility-administered medications on file prior to visit.     Allergies:  Allergies  Allergen Reactions  . Penicillins Rash    Social History:  Social History   Socioeconomic History  . Marital status: Married    Spouse name: Not on file  . Number of children: Not on file  . Years of education: Not on file  . Highest education level: Some college, no degree  Occupational History  . Occupation: retired   Scientific laboratory technician  . Financial resource strain: Not hard at all  . Food  insecurity    Worry: Never true    Inability: Never true  . Transportation needs    Medical: No    Non-medical: No  Tobacco Use  . Smoking status: Former Smoker    Types: Cigarettes    Quit date: 03/08/1965    Years since quitting: 54.4  . Smokeless tobacco: Never Used  Substance and Sexual Activity  . Alcohol use: Yes    Alcohol/week: 5.0 standard drinks    Types: 2 Glasses of wine, 3 Shots of liquor per week  . Drug use: No  . Sexual activity: Not on file  Lifestyle  .  Physical activity    Days per week: 0 days    Minutes per session: 0 min  . Stress: Not at all  Relationships  . Social connections    Talks on phone: More than three times a week    Gets together: More than three times a week    Attends religious service: More than 4 times per year    Active member of club or organization: No    Attends meetings of clubs or organizations: Never    Relationship status: Married  . Intimate partner violence    Fear of current or ex partner: No    Emotionally abused: No    Physically abused: No    Forced sexual activity: No  Other Topics Concern  . Not on file  Social History Narrative  . Not on file   Social History   Tobacco Use  Smoking Status Former Smoker  . Types: Cigarettes  . Quit date: 03/08/1965  . Years since quitting: 54.4  Smokeless Tobacco Never Used   Social History   Substance and Sexual Activity  Alcohol Use Yes  . Alcohol/week: 5.0 standard drinks  . Types: 2 Glasses of wine, 3 Shots of liquor per week    Family History:  Family History  Problem Relation Age of Onset  . Heart disease Mother   . Leukemia Father   . Stroke Father   . Cerebral aneurysm Sister   . Prostate cancer Brother   . Prostate cancer Brother   . Prostate cancer Brother   . Lung cancer Sister   . Bladder Cancer Sister   . Liver cancer Sister   . Brain cancer Sister   . Heart attack Sister   . Breast cancer Neg Hx     Past medical history, surgical history,  medications, allergies, family history and social history reviewed with patient today and changes made to appropriate areas of the chart.   Review of Systems - negative All other ROS negative except what is listed above and in the HPI.      Objective:    BP 122/78 (BP Location: Left Arm, Patient Position: Sitting)   Pulse 61   Temp 98.4 F (36.9 C) (Oral)   Ht 5' 1.9" (1.572 m)   Wt 126 lb (57.2 kg)   SpO2 98%   BMI 23.12 kg/m   Wt Readings from Last 3 Encounters:  08/12/19 126 lb (57.2 kg)  11/01/18 132 lb 1.6 oz (59.9 kg)  08/07/18 125 lb 12.8 oz (57.1 kg)    Physical Exam Constitutional:      General: She is awake. She is not in acute distress.    Appearance: She is well-developed. She is not ill-appearing.  HENT:     Head: Normocephalic and atraumatic.     Right Ear: Hearing, tympanic membrane, ear canal and external ear normal. No drainage.     Left Ear: Hearing, tympanic membrane, ear canal and external ear normal. No drainage.     Nose: Nose normal.     Right Sinus: No maxillary sinus tenderness or frontal sinus tenderness.     Left Sinus: No maxillary sinus tenderness or frontal sinus tenderness.     Mouth/Throat:     Mouth: Mucous membranes are moist.     Pharynx: Oropharynx is clear. Uvula midline. No pharyngeal swelling, oropharyngeal exudate or posterior oropharyngeal erythema.  Eyes:     General: Lids are normal.        Right eye: No discharge.  Left eye: No discharge.     Extraocular Movements: Extraocular movements intact.     Conjunctiva/sclera: Conjunctivae normal.     Pupils: Pupils are equal, round, and reactive to light.     Visual Fields: Right eye visual fields normal and left eye visual fields normal.  Neck:     Musculoskeletal: Normal range of motion and neck supple.     Thyroid: No thyromegaly.     Vascular: No carotid bruit.     Trachea: Trachea normal.  Cardiovascular:     Rate and Rhythm: Normal rate and regular rhythm.     Heart  sounds: Normal heart sounds. No murmur. No gallop.   Pulmonary:     Effort: Pulmonary effort is normal. No accessory muscle usage or respiratory distress.     Breath sounds: Normal breath sounds.  Chest:     Breasts:        Right: Normal.        Left: Normal.  Abdominal:     General: Bowel sounds are normal.     Palpations: Abdomen is soft. There is no hepatomegaly or splenomegaly.     Tenderness: There is no abdominal tenderness.  Musculoskeletal: Normal range of motion.     Right lower leg: No edema.     Left lower leg: No edema.  Lymphadenopathy:     Head:     Right side of head: No submental, submandibular, tonsillar, preauricular or posterior auricular adenopathy.     Left side of head: No submental, submandibular, tonsillar, preauricular or posterior auricular adenopathy.     Cervical: No cervical adenopathy.     Upper Body:     Right upper body: No supraclavicular, axillary or pectoral adenopathy.     Left upper body: No supraclavicular, axillary or pectoral adenopathy.  Skin:    General: Skin is warm and dry.     Capillary Refill: Capillary refill takes less than 2 seconds.     Findings: No rash.  Neurological:     Mental Status: She is alert and oriented to person, place, and time.     Cranial Nerves: Cranial nerves are intact.     Gait: Gait is intact.     Deep Tendon Reflexes: Reflexes are normal and symmetric.     Reflex Scores:      Brachioradialis reflexes are 2+ on the right side and 2+ on the left side.      Patellar reflexes are 2+ on the right side and 2+ on the left side. Psychiatric:        Attention and Perception: Attention normal.        Mood and Affect: Mood normal.        Speech: Speech normal.        Behavior: Behavior normal. Behavior is cooperative.        Thought Content: Thought content normal.        Judgment: Judgment normal.     Results for orders placed or performed in visit on 08/07/18  Microscopic Examination   URINE  Result Value  Ref Range   WBC, UA 0-5 0 - 5 /hpf   RBC, UA None seen 0 - 2 /hpf   Epithelial Cells (non renal) 0-10 0 - 10 /hpf   Bacteria, UA None seen None seen/Few  CBC with Differential/Platelet  Result Value Ref Range   WBC 3.8 3.4 - 10.8 x10E3/uL   RBC 4.95 3.77 - 5.28 x10E6/uL   Hemoglobin 12.5 11.1 - 15.9 g/dL   Hematocrit  38.0 34.0 - 46.6 %   MCV 77 (L) 79 - 97 fL   MCH 25.3 (L) 26.6 - 33.0 pg   MCHC 32.9 31.5 - 35.7 g/dL   RDW 16.2 (H) 12.3 - 15.4 %   Platelets 212 150 - 450 x10E3/uL   Neutrophils 51 Not Estab. %   Lymphs 35 Not Estab. %   Monocytes 12 Not Estab. %   Eos 1 Not Estab. %   Basos 1 Not Estab. %   Neutrophils Absolute 1.9 1.4 - 7.0 x10E3/uL   Lymphocytes Absolute 1.3 0.7 - 3.1 x10E3/uL   Monocytes Absolute 0.5 0.1 - 0.9 x10E3/uL   EOS (ABSOLUTE) 0.0 0.0 - 0.4 x10E3/uL   Basophils Absolute 0.0 0.0 - 0.2 x10E3/uL   Immature Granulocytes 0 Not Estab. %   Immature Grans (Abs) 0.0 0.0 - 0.1 x10E3/uL  Comprehensive metabolic panel  Result Value Ref Range   Glucose 74 65 - 99 mg/dL   BUN 13 8 - 27 mg/dL   Creatinine, Ser 1.05 (H) 0.57 - 1.00 mg/dL   GFR calc non Af Amer 53 (L) >59 mL/min/1.73   GFR calc Af Amer 61 >59 mL/min/1.73   BUN/Creatinine Ratio 12 12 - 28   Sodium 136 134 - 144 mmol/L   Potassium 4.2 3.5 - 5.2 mmol/L   Chloride 100 96 - 106 mmol/L   CO2 24 20 - 29 mmol/L   Calcium 9.4 8.7 - 10.3 mg/dL   Total Protein 6.3 6.0 - 8.5 g/dL   Albumin 4.2 3.5 - 4.8 g/dL   Globulin, Total 2.1 1.5 - 4.5 g/dL   Albumin/Globulin Ratio 2.0 1.2 - 2.2   Bilirubin Total 0.4 0.0 - 1.2 mg/dL   Alkaline Phosphatase 70 39 - 117 IU/L   AST 18 0 - 40 IU/L   ALT 13 0 - 32 IU/L  TSH  Result Value Ref Range   TSH 1.400 0.450 - 4.500 uIU/mL  Lipid panel  Result Value Ref Range   Cholesterol, Total 154 100 - 199 mg/dL   Triglycerides 47 0 - 149 mg/dL   HDL 78 >39 mg/dL   VLDL Cholesterol Cal 9 5 - 40 mg/dL   LDL Calculated 67 0 - 99 mg/dL   Chol/HDL Ratio 2.0 0.0 - 4.4  ratio  Urinalysis, Routine w reflex microscopic  Result Value Ref Range   Specific Gravity, UA 1.015 1.005 - 1.030   pH, UA 7.0 5.0 - 7.5   Color, UA Yellow Yellow   Appearance Ur Clear Clear   Leukocytes, UA 1+ (A) Negative   Protein, UA Negative Negative/Trace   Glucose, UA Negative Negative   Ketones, UA Negative Negative   RBC, UA Negative Negative   Bilirubin, UA Negative Negative   Urobilinogen, Ur 0.2 0.2 - 1.0 mg/dL   Nitrite, UA Negative Negative   Microscopic Examination See below:       Assessment & Plan:   Problem List Items Addressed This Visit      Cardiovascular and Mediastinum   Hypertension    Chronic, stable with initial BP elevated but repeat below goal and home readings at goal.  Patient requests combo pill for Benazepril-Amlodipine, will send script and see if covered, if not return to separate pills.  Continue current medication regimen and checking BP at home.  CMP today.  Return in 6 months.      Relevant Medications   amLODipine-benazepril (LOTREL) 10-20 MG capsule   atorvastatin (LIPITOR) 20 MG tablet   Other Relevant Orders  CBC with Differential/Platelet out   Comprehensive metabolic panel     Digestive   Reflux esophagitis    Chronic, stable.  Continue daily Protonix and attempt reduction to discontinuation in future if tolerated.  Mag level annually.      Relevant Medications   pantoprazole (PROTONIX) 40 MG tablet     Musculoskeletal and Integument   Osteopenia    Next Dexa due in 2025.  Continue current medication regimen.  Check Vit D level.      Relevant Orders   Vit D  25 hydroxy (rtn osteoporosis monitoring)     Genitourinary   CKD (chronic kidney disease) stage 3, GFR 30-59 ml/min    Chronic, ongoing.  Continue collaboration with nephrology and Benazepril for kidney protection.  CMP today.      Relevant Orders   Comprehensive metabolic panel   Lipid Panel w/o Chol/HDL Ratio out     Other   Hyperlipidemia    Chronic,  ongoing.  Continue current medication regimen and adjust as needed.  Refills sent.  Lipid panel and CMP today.      Relevant Medications   amLODipine-benazepril (LOTREL) 10-20 MG capsule   atorvastatin (LIPITOR) 20 MG tablet   Depression    Chronic, stable.  Denies SI/HI.  Continue current medication regimen and adjust as needed. Refills sent.  Return in 6 months.      Relevant Medications   buPROPion (WELLBUTRIN SR) 150 MG 12 hr tablet   traZODone (DESYREL) 50 MG tablet   Insomnia    Chronic, stable.  Continue current medication regimen and adjust as needed.  Refills sent.        Relevant Medications   traZODone (DESYREL) 50 MG tablet   Restless leg syndrome    Chronic, stable without medication.  Continue to monitor and initiate medication as needed.  Check Mag level today.      Systemic lupus erythematosus (HCC)    Chronic, stable.  Continue current medication regimen and collaboration [facility].         Other Visit Diagnoses    Annual physical exam    -  Primary   Relevant Orders   TSH       Follow up plan: Return in about 6 months (around 02/10/2020) for HTN/HLD, Depression, CKD.   LABORATORY TESTING:  - Pap smear: not applicable  IMMUNIZATIONS:   - Tdap: Tetanus vaccination status reviewed: last tetanus booster within 10 years. - Influenza: Up to date - Pneumovax: Up to date - Prevnar: Up to date - HPV: Not applicable - Zostavax vaccine: Up to date  SCREENING: -Mammogram: Up to date  - Colonoscopy: Up to date  - Bone Density: Up to date  -Hearing Test: Not applicable  -Spirometry: Not applicable   PATIENT COUNSELING:   Advised to take 1 mg of folate supplement per day if capable of pregnancy.   Sexuality: Discussed sexually transmitted diseases, partner selection, use of condoms, avoidance of unintended pregnancy  and contraceptive alternatives.   Advised to avoid cigarette smoking.  I discussed with the patient that most people either  abstain from alcohol or drink within safe limits (<=14/week and <=4 drinks/occasion for males, <=7/weeks and <= 3 drinks/occasion for females) and that the risk for alcohol disorders and other health effects rises proportionally with the number of drinks per week and how often a drinker exceeds daily limits.  Discussed cessation/primary prevention of drug use and availability of treatment for abuse.   Diet: Encouraged to adjust caloric  intake to maintain  or achieve ideal body weight, to reduce intake of dietary saturated fat and total fat, to limit sodium intake by avoiding high sodium foods and not adding table salt, and to maintain adequate dietary potassium and calcium preferably from fresh fruits, vegetables, and low-fat dairy products.    stressed the importance of regular exercise  Injury prevention: Discussed safety belts, safety helmets, smoke detector, smoking near bedding or upholstery.   Dental health: Discussed importance of regular tooth brushing, flossing, and dental visits.    NEXT PREVENTATIVE PHYSICAL DUE IN 1 YEAR. Return in about 6 months (around 02/10/2020) for HTN/HLD, Depression, CKD.

## 2019-08-12 NOTE — Assessment & Plan Note (Signed)
Chronic, stable.  Continue current medication regimen and adjust as needed.  Refills sent. 

## 2019-08-12 NOTE — Assessment & Plan Note (Signed)
Chronic, ongoing.  Continue collaboration with nephrology and Benazepril for kidney protection.  CMP today.

## 2019-08-12 NOTE — Assessment & Plan Note (Signed)
Chronic, stable.  Denies SI/HI.  Continue current medication regimen and adjust as needed. Refills sent.  Return in 6 months.

## 2019-08-12 NOTE — Assessment & Plan Note (Signed)
Chronic, stable without medication.  Continue to monitor and initiate medication as needed.  Check Mag level today.

## 2019-08-12 NOTE — Assessment & Plan Note (Signed)
Next Dexa due in 2025.  Continue current medication regimen.  Check Vit D level.

## 2019-08-12 NOTE — Patient Instructions (Signed)
Osteopenia  Osteopenia is a loss of thickness (density) inside of the bones. Another name for osteopenia is low bone mass. Mild osteopenia is a normal part of aging. It is not a disease, and it does not cause symptoms. However, if you have osteopenia and continue to lose bone mass, you could develop a condition that causes the bones to become thin and break more easily (osteoporosis). You may also lose some height, have back pain, and have a stooped posture. Although osteopenia is not a disease, making changes to your lifestyle and diet can help to prevent osteopenia from developing into osteoporosis. What are the causes? Osteopenia is caused by loss of calcium in the bones.  Bones are constantly changing. Old bone cells are continually being replaced with new bone cells. This process builds new bone. The mineral calcium is needed to build new bone and maintain bone density. Bone density is usually highest around age 35. After that, most people's bodies cannot replace all the bone they have lost with new bone. What increases the risk? You are more likely to develop this condition if:  You are older than age 50.  You are a woman who went through menopause early.  You have a long illness that keeps you in bed.  You do not get enough exercise.  You lack certain nutrients (malnutrition).  You have an overactive thyroid gland (hyperthyroidism).  You smoke.  You drink a lot of alcohol.  You are taking medicines that weaken the bones, such as steroids. What are the signs or symptoms? This condition does not cause any symptoms. You may have a slightly higher risk for bone breaks (fractures), so getting fractures more easily than normal may be an indication of osteopenia. How is this diagnosed? Your health care provider can diagnose this condition with a special type of X-ray exam that measures bone density (dual-energy X-ray absorptiometry, DEXA). This test can measure bone density in your  hips, spine, and wrists. Osteopenia has no symptoms, so this condition is usually diagnosed after a routine bone density screening test is done for osteoporosis. This routine screening is usually done for:  Women who are age 65 or older.  Men who are age 70 or older. If you have risk factors for osteopenia, you may have the screening test at an earlier age. How is this treated? Making dietary and lifestyle changes can lower your risk for osteoporosis. If you have severe osteopenia that is close to becoming osteoporosis, your health care provider may prescribe medicines and dietary supplements such as calcium and vitamin D. These supplements help to rebuild bone density. Follow these instructions at home:   Take over-the-counter and prescription medicines only as told by your health care provider. These include vitamins and supplements.  Eat a diet that is high in calcium and vitamin D. ? Calcium is found in dairy products, beans, salmon, and leafy green vegetables like spinach and broccoli. ? Look for foods that have vitamin D and calcium added to them (fortified foods), such as orange juice, cereal, and bread.  Do 30 or more minutes of a weight-bearing exercise every day, such as walking, jogging, or playing a sport. These types of exercises strengthen the bones.  Take precautions at home to lower your risk of falling, such as: ? Keeping rooms well-lit and free of clutter, such as cords. ? Installing safety rails on stairs. ? Using rubber mats in the bathroom or other areas that are often wet or slippery.  Do not use   any products that contain nicotine or tobacco, such as cigarettes and e-cigarettes. If you need help quitting, ask your health care provider.  Avoid alcohol or limit alcohol intake to no more than 1 drink a day for nonpregnant women and 2 drinks a day for men. One drink equals 12 oz of beer, 5 oz of wine, or 1 oz of hard liquor.  Keep all follow-up visits as told by your  health care provider. This is important. Contact a health care provider if:  You have not had a bone density screening for osteoporosis and you are: ? A woman, age 52 or older. ? A man, age 26 or older.  You are a postmenopausal woman who has not had a bone density screening for osteoporosis.  You are older than age 54 and you want to know if you should have bone density screening for osteoporosis. Summary  Osteopenia is a loss of thickness (density) inside of the bones. Another name for osteopenia is low bone mass.  Osteopenia is not a disease, but it may increase your risk for a condition that causes the bones to become thin and break more easily (osteoporosis).  You may be at risk for osteopenia if you are older than age 54 or if you are a woman who went through early menopause.  Osteopenia does not cause any symptoms, but it can be diagnosed with a bone density screening test.  Dietary and lifestyle changes are the first treatment for osteopenia. These may lower your risk for osteoporosis. This information is not intended to replace advice given to you by your health care provider. Make sure you discuss any questions you have with your health care provider. Document Released: 05/31/2017 Document Revised: 08/04/2017 Document Reviewed: 05/31/2017 Elsevier Patient Education  2020 [employer].

## 2019-08-12 NOTE — Addendum Note (Signed)
Addended by: Marnee Guarneri T on: 08/12/2019 09:41 AM   Modules accepted: Orders

## 2019-08-12 NOTE — Assessment & Plan Note (Addendum)
Chronic, ongoing.  Continue current medication regimen and adjust as needed.  Refills sent.  Lipid panel and CMP today.  ?

## 2019-08-12 NOTE — Assessment & Plan Note (Signed)
Chronic, stable with initial BP elevated but repeat below goal and home readings at goal.  Patient requests combo pill for Benazepril-Amlodipine, will send script and see if covered, if not return to separate pills.  Continue current medication regimen and checking BP at home.  CMP today.  Return in 6 months.

## 2019-08-12 NOTE — Assessment & Plan Note (Signed)
Chronic, stable.  Continue current medication regimen and collaboration with dermatology at [facility].   

## 2019-08-13 ENCOUNTER — Other Ambulatory Visit: Payer: Self-pay

## 2019-08-13 LAB — LIPID PANEL W/O CHOL/HDL RATIO
Cholesterol, Total: 166 mg/dL (ref 100–199)
HDL: 80 mg/dL (ref >39)
LDL Chol Calc (NIH): 76 mg/dL (ref 0–99)
Triglycerides: 49 mg/dL (ref 0–149)
VLDL Cholesterol Cal: 10 mg/dL (ref 5–40)

## 2019-08-13 LAB — CBC WITH DIFFERENTIAL/PLATELET
Basophils Absolute: 0 10*3/uL (ref 0.0–0.2)
Basos: 1 %
EOS (ABSOLUTE): 0 10*3/uL (ref 0.0–0.4)
Eos: 1 %
Hematocrit: 42.6 % (ref 34.0–46.6)
Hemoglobin: 13.8 g/dL (ref 11.1–15.9)
Immature Grans (Abs): 0 10*3/uL (ref 0.0–0.1)
Immature Granulocytes: 0 %
Lymphocytes Absolute: 1.4 10*3/uL (ref 0.7–3.1)
Lymphs: 34 %
MCH: 26.4 pg — ABNORMAL LOW (ref 26.6–33.0)
MCHC: 32.4 g/dL (ref 31.5–35.7)
MCV: 82 fL (ref 79–97)
Monocytes Absolute: 0.4 10*3/uL (ref 0.1–0.9)
Monocytes: 10 %
Neutrophils Absolute: 2.2 10*3/uL (ref 1.4–7.0)
Neutrophils: 54 %
Platelets: 188 10*3/uL (ref 150–450)
RBC: 5.22 x10E6/uL (ref 3.77–5.28)
RDW: 12.4 % (ref 11.7–15.4)
WBC: 4.1 10*3/uL (ref 3.4–10.8)

## 2019-08-13 LAB — COMPREHENSIVE METABOLIC PANEL
ALT: 17 IU/L (ref 0–32)
AST: 24 IU/L (ref 0–40)
Albumin/Globulin Ratio: 1.9 (ref 1.2–2.2)
Albumin: 4.4 g/dL (ref 3.7–4.7)
Alkaline Phosphatase: 69 IU/L (ref 39–117)
BUN/Creatinine Ratio: 13 (ref 12–28)
BUN: 15 mg/dL (ref 8–27)
Bilirubin Total: 0.4 mg/dL (ref 0.0–1.2)
CO2: 23 mmol/L (ref 20–29)
Calcium: 9.5 mg/dL (ref 8.7–10.3)
Chloride: 103 mmol/L (ref 96–106)
Creatinine, Ser: 1.12 mg/dL — ABNORMAL HIGH (ref 0.57–1.00)
GFR calc Af Amer: 56 mL/min/{1.73_m2} — ABNORMAL LOW (ref >59)
GFR calc non Af Amer: 49 mL/min/{1.73_m2} — ABNORMAL LOW (ref >59)
Globulin, Total: 2.3 g/dL (ref 1.5–4.5)
Glucose: 72 mg/dL (ref 65–99)
Potassium: 4.2 mmol/L (ref 3.5–5.2)
Sodium: 140 mmol/L (ref 134–144)
Total Protein: 6.7 g/dL (ref 6.0–8.5)

## 2019-08-13 LAB — VITAMIN D 25 HYDROXY (VIT D DEFICIENCY, FRACTURES): Vit D, 25-Hydroxy: 36.1 ng/mL (ref 30.0–100.0)

## 2019-08-13 LAB — TSH: TSH: 0.626 u[IU]/mL (ref 0.450–4.500)

## 2019-08-13 MED ORDER — TESTOSTERONE 1.62 % TD GEL: 1.0000 "application " | TRANSDERMAL | 2 refills | Status: DC

## 2019-08-16 LAB — SAR COV2 SEROLOGY (COVID19)AB(IGG),IA: DiaSorin SARS-CoV-2 Ab, IgG: NEGATIVE

## 2019-10-01 ENCOUNTER — Telehealth: Payer: Self-pay

## 2019-10-01 NOTE — Telephone Encounter (Signed)
Looks like has not been on since 2018.  Would benefit from office visit to discuss restarting this and current symptoms.  Could do virtual visit for this.

## 2019-10-01 NOTE — Telephone Encounter (Signed)
Fax from pharmacy. Request for Estrace Cream 0.01%. Medication not in patient's current medication list but has been on it before in the past.   LOV 08/12/2019 Next Appt 02/10/2020

## 2019-10-02 NOTE — Telephone Encounter (Signed)
Virtual visit scheduled for tomorrow at 9:30AM.

## 2019-10-03 ENCOUNTER — Other Ambulatory Visit: Payer: Self-pay

## 2019-10-03 ENCOUNTER — Ambulatory Visit (INDEPENDENT_AMBULATORY_CARE_PROVIDER_SITE_OTHER): Payer: PPO | Admitting: Nurse Practitioner

## 2019-10-03 ENCOUNTER — Encounter: Payer: Self-pay | Admitting: Nurse Practitioner

## 2019-10-03 VITALS — BP 128/78 | HR 60 | Ht 62.0 in | Wt 122.0 lb

## 2019-10-03 DIAGNOSIS — N951 Menopausal and female climacteric states: Secondary | ICD-10-CM | POA: Insufficient documentation

## 2019-10-03 MED ORDER — ESTRADIOL 0.1 MG/GM VA CREA: 1.0000 | TOPICAL_CREAM | Freq: Every day | VAGINAL | 3 refills | Status: DC

## 2019-10-03 NOTE — Assessment & Plan Note (Addendum)
Per patient, has taken estrace for about 2 years and helps significantly with symptoms.  Will refill today.  Instructed to use a pea-sized amount vaginally and avoid intercourse immediatly after.  Patient to call with questions or concerns.

## 2019-10-03 NOTE — Progress Notes (Signed)
BP 128/78 (BP Location: Left Arm, Cuff Size: Small)   Pulse 60   Ht 5\' 2"  (1.575 m)   Wt 122 lb (55.3 kg)   BMI 22.31 kg/m    Subjective:    Patient ID: Lindsey Blair, female    DOB: [birth year], 74 y.o.   MRN: [id number]  HPI: Lindsey Blair is a 74 y.o. female  Chief Complaint  Patient presents with  . Vaginal Dryness  . Medication Refill    Would like refill on Estrace Cream.   VAGINAL DRYNESS Duration: years - 2 years Symptom severity: severe Hot flashes: no Night sweats: no Sleep disturbances: no Vaginal dryness: yes Dyspareunia:yes Decreased libido: no Emotional lability: no Stress incontinence: no Previous HRT/pharmacotherapy: yes Absolute Contraindications to Hormonal Therapy:     Undiagnosed vaginal bleeding: no    Breast cancer: no    Endometrial cancer: no    Coronary disease: no    Cerebrovascular disease: no    Venous thromboembolic disease: no  Allergies  Allergen Reactions  . Penicillins Rash   Outpatient Encounter Medications as of 10/03/2019  Medication Sig  . alendronate (FOSAMAX) 35 MG tablet Take 1 tablet (35 mg total) by mouth every 7 (seven) days. Take with a full glass of water on an empty stomach.  Marland Kitchen atorvastatin (LIPITOR) 20 MG tablet Take 1 tablet (20 mg total) by mouth daily.  Marland Kitchen buPROPion (WELLBUTRIN SR) 150 MG 12 hr tablet Take 1 tablet (150 mg total) by mouth 2 (two) times daily.  . hydroxychloroquine (PLAQUENIL) 200 MG tablet Take 200 mg by mouth daily.  . Multiple Vitamin (MULTI-VITAMIN DAILY PO) Take by mouth.  . pantoprazole (PROTONIX) 40 MG tablet Take 1 tablet (40 mg total) by mouth daily.  . Testosterone 1.62 % GEL Place 1 application onto the skin 3 (three) times a week. I am unable to make the computer do a 4% gel but please use  . traZODone (DESYREL) 50 MG tablet Take 1 tablet (50 mg total) by mouth at bedtime.  Marland Kitchen amLODipine-benazepril (LOTREL) 10-20 MG capsule Take 1 capsule by mouth daily. (Patient not taking: Reported  on 10/03/2019)  . estradiol (ESTRACE VAGINAL) 0.1 MG/GM vaginal cream Place 1 Applicatorful vaginally at bedtime. Place pea-sized amount vaginally at bedtime.  . [DISCONTINUED] Turmeric 500 MG TABS Take 2,000 mg by mouth.   No facility-administered encounter medications on file as of 10/03/2019.   Patient Active Problem List   Diagnosis Date Noted  . Vaginal dryness, menopausal 10/03/2019  . Osteopenia 08/12/2019  . CKD (chronic kidney disease) stage 3, GFR 30-59 ml/min 08/11/2019  . Systemic lupus erythematosus ([place]) 08/11/2019  . Restless leg syndrome 06/12/2017  . Insomnia 03/06/2017  . Advanced care planning/counseling discussion 03/06/2017  . Atrophic vaginitis 07/20/2016  . Reflux esophagitis   . Hypertension 04/27/2015  . Hyperlipidemia   . Depression    Past Medical History:  Diagnosis Date  . Anxiety   . Cancer [facility]) Several instances of the years   Skin  . Chronic kidney disease   . Cutaneous lupus erythematosus   . Depression   . Hyperlipidemia   . Hypertension   . Vertigo    rare   Relevant past medical, surgical, family and social history reviewed and updated as indicated. Interim medical history since our last visit reviewed.  Review of Systems  Constitutional: Negative.   Respiratory: Negative.   Cardiovascular: Negative.   Genitourinary: Positive for dyspareunia and vaginal pain. Negative for difficulty urinating, dysuria, enuresis, hematuria, pelvic  pain, urgency and vaginal bleeding.  Neurological: Negative.   Psychiatric/Behavioral: Negative.    Per HPI unless specifically indicated above     Objective:    BP 128/78 (BP Location: Left Arm, Cuff Size: Small)   Pulse 60   Ht 5\' 2"  (1.575 m)   Wt 122 lb (55.3 kg)   BMI 22.31 kg/m   Wt Readings from Last 3 Encounters:  10/03/19 122 lb (55.3 kg)  08/12/19 126 lb (57.2 kg)  11/01/18 132 lb 1.6 oz (59.9 kg)    Physical Exam Physical exam unable to be performed due to lack of equipment.      Assessment & Plan:   Problem List Items Addressed This Visit      Genitourinary   Vaginal dryness, menopausal - Primary    Per patient, has taken estrace for about 2 years and helps significantly with symptoms.  Will refill today.  Instructed to use a pea-sized amount vaginally and avoid intercourse immediatly after.  Patient to call with questions or concerns.      Relevant Medications   estradiol (ESTRACE VAGINAL) 0.1 MG/GM vaginal cream       Follow up plan: Return if symptoms worsen or fail to improve.   This visit was completed via telephone due to the restrictions of the COVID-19 pandemic. All issues as above were discussed and addressed but no physical exam was performed. If it was felt that the patient should be evaluated in the office, they were directed there. The patient verbally consented to this visit. Patient was unable to complete an audio/visual visit due to Lack of equipment. . Location of the patient: home . Location of the provider: work . Those involved with this call:  . Provider: Carnella Guadalajara, DNP . CMA: Merilyn Baba, CMA . Front Desk/Registration: Jill Side  . Time spent on call: 10 minutes on the phone discussing health concerns. 20 minutes total spent in review of patient's record and preparation of their chart.

## 2019-10-31 DIAGNOSIS — Z85828 Personal history of other malignant neoplasm of skin: Secondary | ICD-10-CM | POA: Diagnosis not present

## 2019-10-31 DIAGNOSIS — L931 Subacute cutaneous lupus erythematosus: Secondary | ICD-10-CM | POA: Diagnosis not present

## 2019-10-31 DIAGNOSIS — D2271 Melanocytic nevi of right lower limb, including hip: Secondary | ICD-10-CM | POA: Diagnosis not present

## 2019-10-31 DIAGNOSIS — L570 Actinic keratosis: Secondary | ICD-10-CM | POA: Diagnosis not present

## 2019-10-31 DIAGNOSIS — X32XXXA Exposure to sunlight, initial encounter: Secondary | ICD-10-CM | POA: Diagnosis not present

## 2019-10-31 DIAGNOSIS — L821 Other seborrheic keratosis: Secondary | ICD-10-CM | POA: Diagnosis not present

## 2019-10-31 DIAGNOSIS — D2261 Melanocytic nevi of right upper limb, including shoulder: Secondary | ICD-10-CM | POA: Diagnosis not present

## 2019-10-31 DIAGNOSIS — D2272 Melanocytic nevi of left lower limb, including hip: Secondary | ICD-10-CM | POA: Diagnosis not present

## 2019-10-31 DIAGNOSIS — D2262 Melanocytic nevi of left upper limb, including shoulder: Secondary | ICD-10-CM | POA: Diagnosis not present

## 2019-11-06 DIAGNOSIS — Z79899 Other long term (current) drug therapy: Secondary | ICD-10-CM | POA: Diagnosis not present

## 2019-12-17 ENCOUNTER — Other Ambulatory Visit: Payer: Self-pay | Admitting: Nurse Practitioner

## 2019-12-21 IMAGING — MG DIGITAL SCREENING BILATERAL MAMMOGRAM WITH TOMO AND CAD
6 of 10 series · 6 of 30 positions shown · non-contrast
Comparison: Previous exam(s).

CLINICAL DATA: Screening.

EXAM:
DIGITAL SCREENING BILATERAL MAMMOGRAM WITH TOMO AND CAD

[L CC synth-2D]
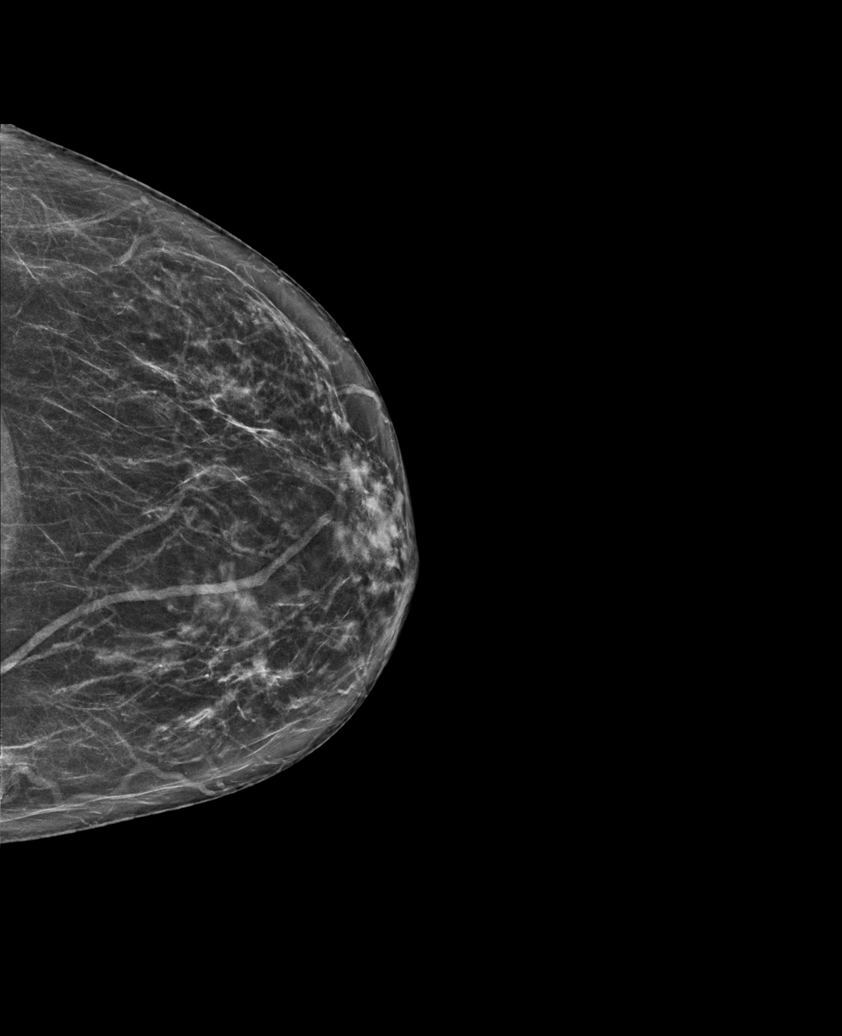

[R CC synth-2D]
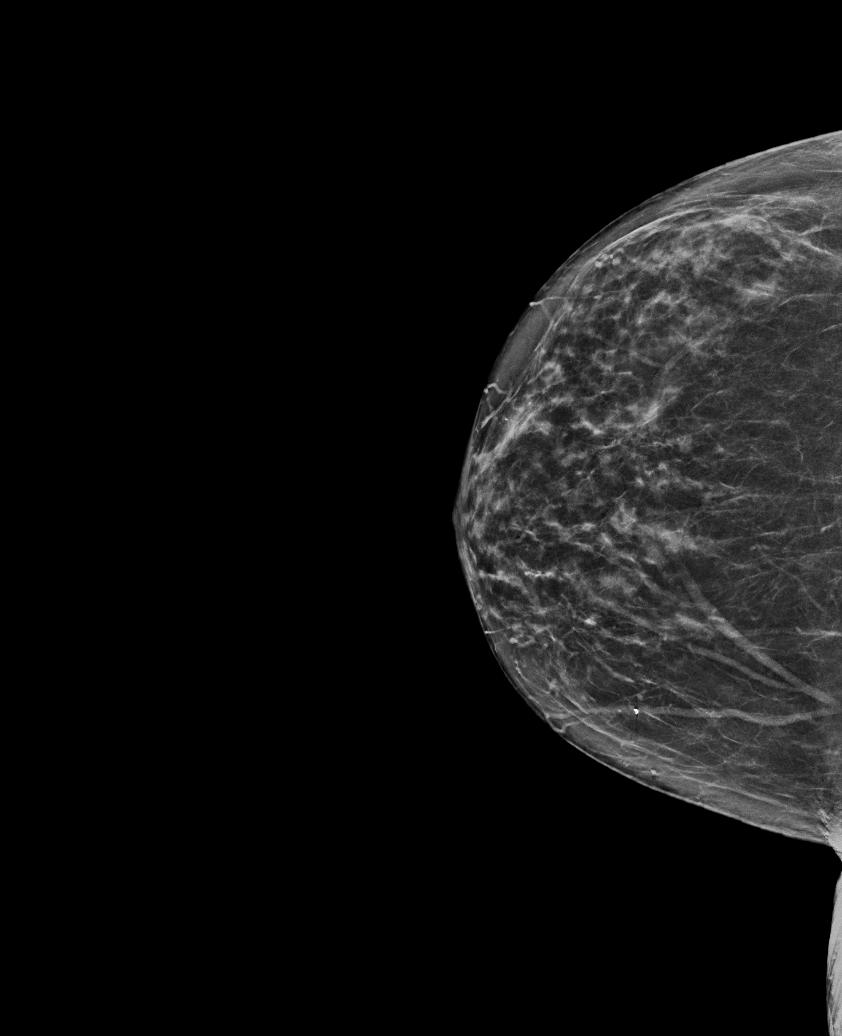

[R MLO synth-2D (1 of 2)]
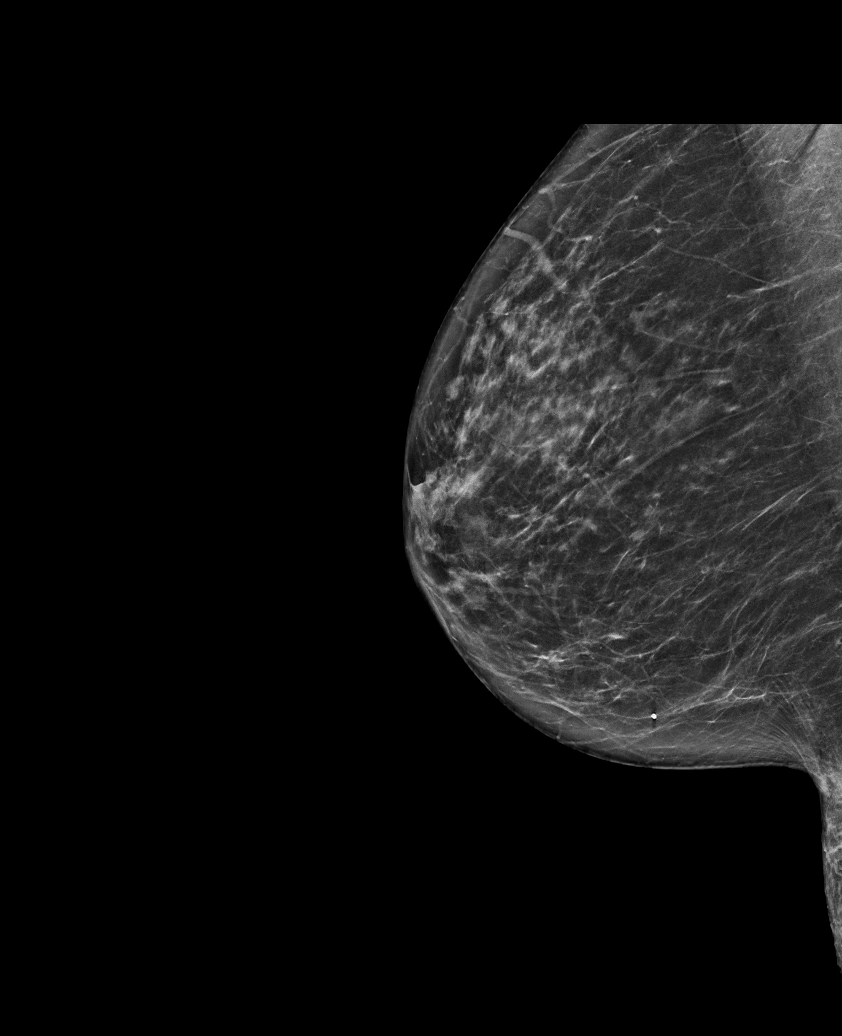

[R MLO synth-2D (2 of 2)]
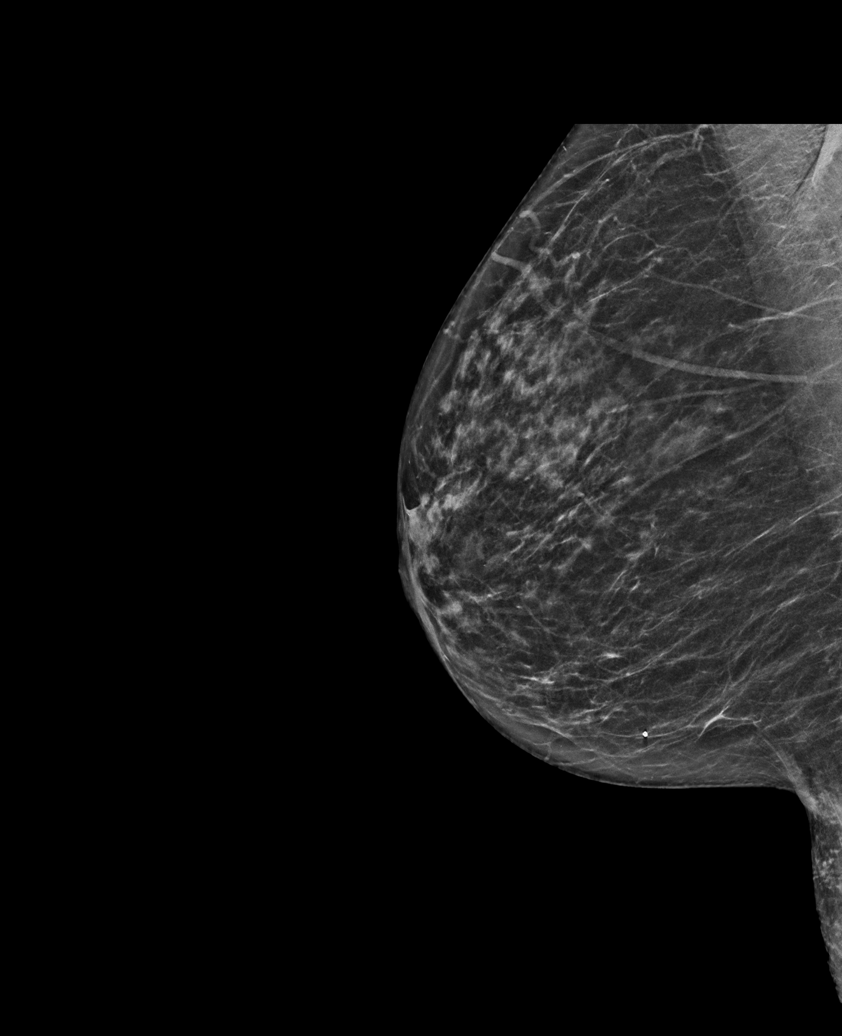

[L MLO synth-2D]
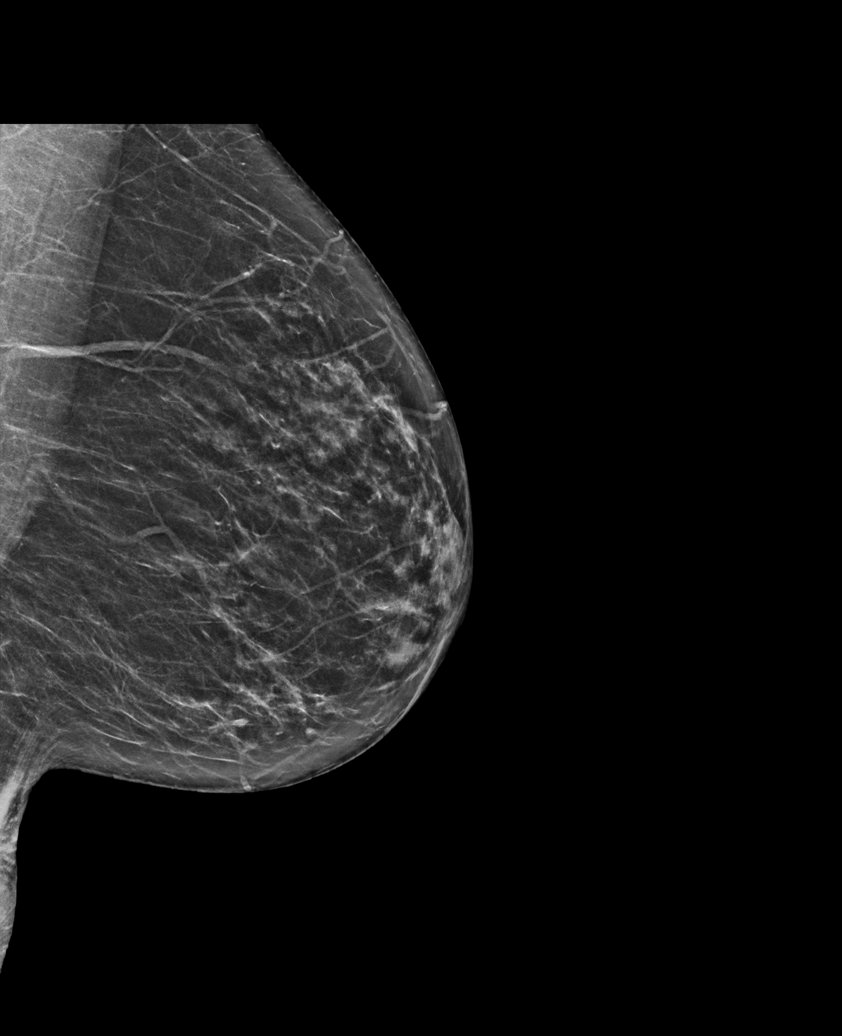

[L CC tomo · tomo slice 32/63.0]
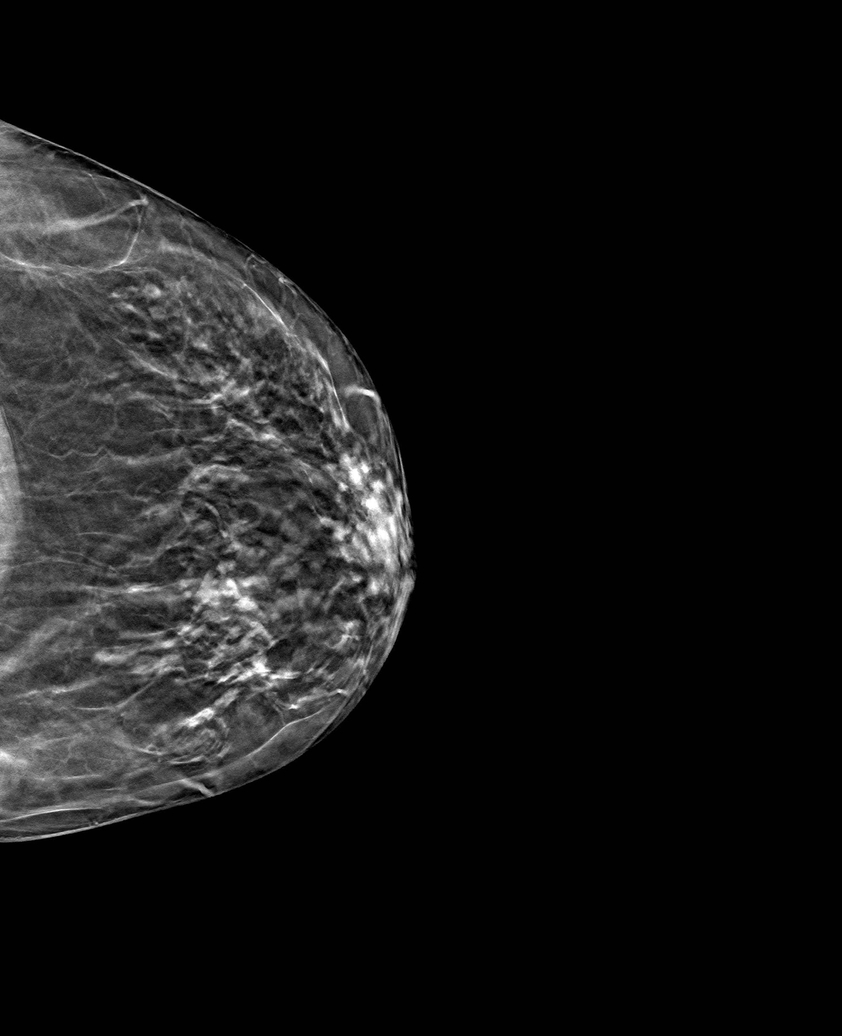

[6 of 30 positions shown; findings below may reference images not displayed]

ACR Breast Density Category c: The breast tissue is heterogeneously
dense, which may obscure small masses.
FINDINGS: There are no findings suspicious for malignancy. Images were
processed with CAD.
IMPRESSION: No mammographic evidence of malignancy. A result letter of this
screening mammogram will be mailed directly to the patient.

RECOMMENDATION:
Screening mammogram in one year. (Code:FT-U-LHB)

BI-RADS CATEGORY  1: Negative.

## 2020-01-15 ENCOUNTER — Encounter: Payer: Self-pay | Admitting: Podiatry

## 2020-01-15 ENCOUNTER — Ambulatory Visit: Payer: PPO | Admitting: Podiatry

## 2020-01-15 ENCOUNTER — Other Ambulatory Visit: Payer: Self-pay

## 2020-01-15 DIAGNOSIS — L03031 Cellulitis of right toe: Secondary | ICD-10-CM

## 2020-01-15 MED ORDER — CIPROFLOXACIN HCL 500 MG PO TABS
500.0000 mg | ORAL_TABLET | Freq: Two times a day (BID) | ORAL | 0 refills | Status: DC
Start: 2020-01-15 — End: 2020-02-10

## 2020-01-15 MED ORDER — CIPROFLOXACIN HCL 500 MG PO TABS
500.0000 mg | ORAL_TABLET | Freq: Two times a day (BID) | ORAL | 0 refills | Status: DC
Start: 2020-01-15 — End: 2020-01-15

## 2020-01-15 NOTE — Progress Notes (Signed)
Subjective:  Patient ID: Lindsey Blair, female    DOB: 10-12-1945,  MRN: [id number] HPI Chief Complaint  Patient presents with  . Toe Pain    Hallux right - thick, discolored nail x 3 weeks, no injury, red, swollen, tender at the base, better today  . New Patient (Initial Visit)    74 y.o. female presents with the above complaint.   ROS: Denies fever chills nausea vomiting muscle aches pains calf pain back pain chest pain shortness of breath.  Past Medical History:  Diagnosis Date  . Anxiety   . Cancer [facility]) Several instances of the years   Skin  . Chronic kidney disease   . Cutaneous lupus erythematosus   . Depression   . Hyperlipidemia   . Hypertension   . Vertigo    rare   Past Surgical History:  Procedure Laterality Date  . BREAST BIOPSY Right 2000   Negative  . CATARACT EXTRACTION Right 08/05/2019  . COLONOSCOPY WITH PROPOFOL N/A 10/22/2015   Procedure: COLONOSCOPY WITH PROPOFOL;  Surgeon: Lucilla Lame, MD;  Location: [place];  Service: Endoscopy;  Laterality: N/A;  . ESOPHAGOGASTRODUODENOSCOPY (EGD) WITH PROPOFOL N/A 10/22/2015   Procedure: ESOPHAGOGASTRODUODENOSCOPY (EGD) WITH PROPOFOL;  Surgeon: Lucilla Lame, MD;  Location: [place];  Service: Endoscopy;  Laterality: N/A;  . IMAGE GUIDED SINUS SURGERY  04/06/11   [facility], Dr. Pryor Ochoa  . MASTOIDECTOMY      Current Outpatient Medications:  .  alendronate (FOSAMAX) 35 MG tablet, Take 1 tablet (35 mg total) by mouth every 7 (seven) days. Take with a full glass of water on an empty stomach., Disp: 12 tablet, Rfl: 4 .  amLODipine (NORVASC) 10 MG tablet, Take 10 mg by mouth daily., Disp: , Rfl:  .  amLODipine-benazepril (LOTREL) 10-20 MG capsule, Take 1 capsule by mouth daily. (Patient not taking: Reported on 10/03/2019), Disp: 90 capsule, Rfl: 3 .  atorvastatin (LIPITOR) 20 MG tablet, Take 1 tablet (20 mg total) by mouth daily., Disp: 90 tablet, Rfl: 4 .  benazepril (LOTENSIN) 20 MG tablet, Take 20 mg by  mouth daily., Disp: , Rfl:  .  buPROPion (WELLBUTRIN SR) 150 MG 12 hr tablet, Take 1 tablet (150 mg total) by mouth 2 (two) times daily., Disp: 180 tablet, Rfl: 4 .  ciprofloxacin (CIPRO) 500 MG tablet, Take 1 tablet (500 mg total) by mouth 2 (two) times daily., Disp: 20 tablet, Rfl: 0 .  estradiol (ESTRACE VAGINAL) 0.1 MG/GM vaginal cream, Place 1 Applicatorful vaginally at bedtime. Place pea-sized amount vaginally at bedtime., Disp: 42.5 g, Rfl: 3 .  hydroxychloroquine (PLAQUENIL) 200 MG tablet, Take 200 mg by mouth daily., Disp: , Rfl:  .  Multiple Vitamin (MULTI-VITAMIN DAILY PO), Take by mouth., Disp: , Rfl:  .  pantoprazole (PROTONIX) 40 MG tablet, Take 1 tablet (40 mg total) by mouth daily., Disp: 90 tablet, Rfl: 4 .  Testosterone 1.62 % GEL, Place 1 application onto the skin 3 (three) times a week. I am unable to make the computer do a 4% gel but please use, Disp: 15 g, Rfl: 2 .  traZODone (DESYREL) 50 MG tablet, Take 1 tablet (50 mg total) by mouth at bedtime., Disp: 90 tablet, Rfl: 4  Allergies  Allergen Reactions  . Penicillins Rash   Review of Systems Objective:  There were no vitals filed for this visit.  General: Well developed, nourished, in no acute distress, alert and oriented x3   Dermatological: Skin is warm, dry and supple bilateral. Nails x  10 are well maintained; remaining integument appears unremarkable at this time. There are no open sores, no preulcerative lesions, no rash or signs of infection present.  Vascular: Dorsalis Pedis artery and Posterior Tibial artery pedal pulses are 2/4 bilateral with immedate capillary fill time. Pedal hair growth present. No varicosities and no lower extremity edema present bilateral.   Neruologic: Grossly intact via light touch bilateral. Vibratory intact via tuning fork bilateral. Protective threshold with Semmes Wienstein monofilament intact to all pedal sites bilateral. Patellar and Achilles deep tendon reflexes 2+ bilateral. No  Babinski or clonus noted bilateral.   Musculoskeletal: No gross boney pedal deformities bilateral. No pain, crepitus, or limitation noted with foot and ankle range of motion bilateral. Muscular strength 5/5 in all groups tested bilateral.  Gait: Unassisted, Nonantalgic.    Radiographs:  None taken  Assessment & Plan:   Assessment: Subungual bacterial colonization hallux right most likely pseudomonal.  Plan: Recommend she continue to soak Epson salts and warm water I also informed her that most likely her nail will fall off.  She has a prescription for Cipro for 10 days 1 p.o. twice daily and I will see her in a couple weeks.     Harvir Patry T. [place], [place]

## 2020-01-20 ENCOUNTER — Other Ambulatory Visit: Payer: Self-pay | Admitting: Nurse Practitioner

## 2020-01-20 DIAGNOSIS — Z1231 Encounter for screening mammogram for malignant neoplasm of breast: Secondary | ICD-10-CM

## 2020-02-04 ENCOUNTER — Telehealth: Payer: Self-pay | Admitting: Nurse Practitioner

## 2020-02-04 NOTE — Telephone Encounter (Signed)
Noted and agree with Dr. Wynetta Emery that patient should go to UC setting this evening for further evaluation due to symptoms and possibility of kidney stones.

## 2020-02-04 NOTE — Telephone Encounter (Signed)
Called pt back advised would need an appt, pt stated that she couldn't come in at the moment because she could barely move. Spoke with Janett Billow she states that she would recommend er or uc for imaging to be done. Pt states that her sister came and picked her up out of the floor by her waist. Dr Wynetta Emery stated that she would see her in office today if she could come in. Pt then said that she was feeling ok and she would hold off and see. Pt then said that she had been vomiting and had diarrhea all last night. Gave message to Dr Wynetta Emery she stated that she recommended pt to go to [facility] as she may have a kidney infection or kidney stones. Pt had called back and gotten office administrator on the phone who relayed Dr Durenda Age message. Pt states that she can is for sure that the vomiting and diarrhea is not related to back pain. Pt states that she will hold off and try to take something at home.    Copied from [place] [id number]. Topic: General - Inquiry >> Feb 04, 2020  2:45 PM Greggory Keen D wrote: Reason for CRM: Pt is having muscle spasms in her back.  She said the only way she can get any relieve is to lay in the floor.  She thinks she might have done something weekend to cause it.  She said she cant some in because she is iuin such pain,  She wants to know if she can get a muscle relaxer sent to [facility].   Her husband can pick it up for her.   CB# [phone]

## 2020-02-10 ENCOUNTER — Encounter: Payer: PPO | Admitting: Podiatry

## 2020-02-10 ENCOUNTER — Ambulatory Visit (INDEPENDENT_AMBULATORY_CARE_PROVIDER_SITE_OTHER): Payer: PPO | Admitting: Nurse Practitioner

## 2020-02-10 ENCOUNTER — Other Ambulatory Visit: Payer: Self-pay

## 2020-02-10 ENCOUNTER — Encounter: Payer: Self-pay | Admitting: Nurse Practitioner

## 2020-02-10 VITALS — BP 135/72 | HR 63 | Temp 98.7°F | Ht 62.0 in | Wt 124.8 lb

## 2020-02-10 DIAGNOSIS — F3342 Major depressive disorder, recurrent, in full remission: Secondary | ICD-10-CM

## 2020-02-10 DIAGNOSIS — F5104 Psychophysiologic insomnia: Secondary | ICD-10-CM

## 2020-02-10 DIAGNOSIS — I10 Essential (primary) hypertension: Secondary | ICD-10-CM | POA: Diagnosis not present

## 2020-02-10 DIAGNOSIS — M549 Dorsalgia, unspecified: Secondary | ICD-10-CM | POA: Insufficient documentation

## 2020-02-10 DIAGNOSIS — N1831 Chronic kidney disease, stage 3a: Secondary | ICD-10-CM

## 2020-02-10 DIAGNOSIS — M545 Low back pain, unspecified: Secondary | ICD-10-CM

## 2020-02-10 DIAGNOSIS — E7800 Pure hypercholesterolemia, unspecified: Secondary | ICD-10-CM | POA: Diagnosis not present

## 2020-02-10 MED ORDER — TIZANIDINE HCL 4 MG PO TABS
4.0000 mg | ORAL_TABLET | Freq: Four times a day (QID) | ORAL | 2 refills | Status: DC | PRN
Start: 2020-02-10 — End: 2020-05-05

## 2020-02-10 MED ORDER — AMLODIPINE BESY-BENAZEPRIL HCL 10-20 MG PO CAPS: 1.0000 | ORAL_CAPSULE | Freq: Every day | ORAL | 3 refills | Status: DC

## 2020-02-10 NOTE — Assessment & Plan Note (Signed)
Chronic, ongoing.  Continue current medication regimen and adjust as needed. Lipid panel today. 

## 2020-02-10 NOTE — Assessment & Plan Note (Signed)
Chronic, stable.  Denies SI/HI.  Continue current medication regimen and adjust as needed.  Return in 6 months. 

## 2020-02-10 NOTE — Assessment & Plan Note (Signed)
Chronic, stable.  Continue current medication regimen and adjust as needed.  Trazodone benefits mood and sleep.

## 2020-02-10 NOTE — Assessment & Plan Note (Signed)
Chronic, ongoing.  Continue collaboration with nephrology and Benazepril for kidney protection.  BMP today.

## 2020-02-10 NOTE — Patient Instructions (Signed)
DASH Eating Plan DASH stands for "Dietary Approaches to Stop Hypertension." The DASH eating plan is a healthy eating plan that has been shown to reduce high blood pressure (hypertension). It may also reduce your risk for type 2 diabetes, heart disease, and stroke. The DASH eating plan may also help with weight loss. What are tips for following this plan?  General guidelines  Avoid eating more than 2,300 mg (milligrams) of salt (sodium) a day. If you have hypertension, you may need to reduce your sodium intake to 1,500 mg a day.  Limit alcohol intake to no more than 1 drink a day for nonpregnant women and 2 drinks a day for men. One drink equals 12 oz of beer, 5 oz of wine, or 1 oz of hard liquor.  Work with your health care provider to maintain a healthy body weight or to lose weight. Ask what an ideal weight is for you.  Get at least 30 minutes of exercise that causes your heart to beat faster (aerobic exercise) most days of the week. Activities may include walking, swimming, or biking.  Work with your health care provider or diet and nutrition specialist (dietitian) to adjust your eating plan to your individual calorie needs. Reading food labels   Check food labels for the amount of sodium per serving. Choose foods with less than 5 percent of the Daily Value of sodium. Generally, foods with less than 300 mg of sodium per serving fit into this eating plan.  To find whole grains, look for the word "whole" as the first word in the ingredient list. Shopping  Buy products labeled as "low-sodium" or "no salt added."  Buy fresh foods. Avoid canned foods and premade or frozen meals. Cooking  Avoid adding salt when cooking. Use salt-free seasonings or herbs instead of table salt or sea salt. Check with your health care provider or pharmacist before using salt substitutes.  Do not fry foods. Cook foods using healthy methods such as baking, boiling, grilling, and broiling instead.  Cook with  heart-healthy oils, such as olive, canola, soybean, or sunflower oil. Meal planning  Eat a balanced diet that includes: ? 5 or more servings of fruits and vegetables each day. At each meal, try to fill half of your plate with fruits and vegetables. ? Up to 6-8 servings of whole grains each day. ? Less than 6 oz of lean meat, poultry, or fish each day. A 3-oz serving of meat is about the same size as a deck of cards. One egg equals 1 oz. ? 2 servings of low-fat dairy each day. ? A serving of nuts, seeds, or beans 5 times each week. ? Heart-healthy fats. Healthy fats called Omega-3 fatty acids are found in foods such as flaxseeds and coldwater fish, like sardines, salmon, and mackerel.  Limit how much you eat of the following: ? Canned or prepackaged foods. ? Food that is high in trans fat, such as fried foods. ? Food that is high in saturated fat, such as fatty meat. ? Sweets, desserts, sugary drinks, and other foods with added sugar. ? Full-fat dairy products.  Do not salt foods before eating.  Try to eat at least 2 vegetarian meals each week.  Eat more home-cooked food and less restaurant, buffet, and fast food.  When eating at a restaurant, ask that your food be prepared with less salt or no salt, if possible. What foods are recommended? The items listed may not be a complete list. Talk with your dietitian about   what dietary choices are best for you. Grains Whole-grain or whole-wheat bread. Whole-grain or whole-wheat pasta. Brown rice. Oatmeal. Quinoa. Bulgur. Whole-grain and low-sodium cereals. Pita bread. Low-fat, low-sodium crackers. Whole-wheat flour tortillas. Vegetables Fresh or frozen vegetables (raw, steamed, roasted, or grilled). Low-sodium or reduced-sodium tomato and vegetable juice. Low-sodium or reduced-sodium tomato sauce and tomato paste. Low-sodium or reduced-sodium canned vegetables. Fruits All fresh, dried, or frozen fruit. Canned fruit in natural juice (without  added sugar). Meat and other protein foods Skinless chicken or turkey. Ground chicken or turkey. Pork with fat trimmed off. Fish and seafood. Egg whites. Dried beans, peas, or lentils. Unsalted nuts, nut butters, and seeds. Unsalted canned beans. Lean cuts of beef with fat trimmed off. Low-sodium, lean deli meat. Dairy Low-fat (1%) or fat-free (skim) milk. Fat-free, low-fat, or reduced-fat cheeses. Nonfat, low-sodium ricotta or cottage cheese. Low-fat or nonfat yogurt. Low-fat, low-sodium cheese. Fats and oils Soft margarine without trans fats. Vegetable oil. Low-fat, reduced-fat, or light mayonnaise and salad dressings (reduced-sodium). Canola, safflower, olive, soybean, and sunflower oils. Avocado. Seasoning and other foods Herbs. Spices. Seasoning mixes without salt. Unsalted popcorn and pretzels. Fat-free sweets. What foods are not recommended? The items listed may not be a complete list. Talk with your dietitian about what dietary choices are best for you. Grains Baked goods made with fat, such as croissants, muffins, or some breads. Dry pasta or rice meal packs. Vegetables Creamed or fried vegetables. Vegetables in a cheese sauce. Regular canned vegetables (not low-sodium or reduced-sodium). Regular canned tomato sauce and paste (not low-sodium or reduced-sodium). Regular tomato and vegetable juice (not low-sodium or reduced-sodium). Pickles. Olives. Fruits Canned fruit in a light or heavy syrup. Fried fruit. Fruit in cream or butter sauce. Meat and other protein foods Fatty cuts of meat. Ribs. Fried meat. Bacon. Sausage. Bologna and other processed lunch meats. Salami. Fatback. Hotdogs. Bratwurst. Salted nuts and seeds. Canned beans with added salt. Canned or smoked fish. Whole eggs or egg yolks. Chicken or turkey with skin. Dairy Whole or 2% milk, cream, and half-and-half. Whole or full-fat cream cheese. Whole-fat or sweetened yogurt. Full-fat cheese. Nondairy creamers. Whipped toppings.  Processed cheese and cheese spreads. Fats and oils Butter. Stick margarine. Lard. Shortening. Ghee. Bacon fat. Tropical oils, such as coconut, palm kernel, or palm oil. Seasoning and other foods Salted popcorn and pretzels. Onion salt, garlic salt, seasoned salt, table salt, and sea salt. Worcestershire sauce. Tartar sauce. Barbecue sauce. Teriyaki sauce. Soy sauce, including reduced-sodium. Steak sauce. Canned and packaged gravies. Fish sauce. Oyster sauce. Cocktail sauce. Horseradish that you find on the shelf. Ketchup. Mustard. Meat flavorings and tenderizers. Bouillon cubes. Hot sauce and Tabasco sauce. Premade or packaged marinades. Premade or packaged taco seasonings. Relishes. Regular salad dressings. Where to find more information:  National Heart, Lung, and Blood Institute: www.nhlbi.nih.gov  American Heart Association: www.heart.org Summary  The DASH eating plan is a healthy eating plan that has been shown to reduce high blood pressure (hypertension). It may also reduce your risk for type 2 diabetes, heart disease, and stroke.  With the DASH eating plan, you should limit salt (sodium) intake to 2,300 mg a day. If you have hypertension, you may need to reduce your sodium intake to 1,500 mg a day.  When on the DASH eating plan, aim to eat more fresh fruits and vegetables, whole grains, lean proteins, low-fat dairy, and heart-healthy fats.  Work with your health care provider or diet and nutrition specialist (dietitian) to adjust your eating plan to your   individual calorie needs. This information is not intended to replace advice given to you by your health care provider. Make sure you discuss any questions you have with your health care provider. Document Revised: 08/04/2017 Document Reviewed: 08/15/2016 Elsevier Patient Education  2020 Elsevier Inc.  

## 2020-02-10 NOTE — Progress Notes (Signed)
BP 135/72 (BP Location: Left Arm, Patient Position: Sitting, Cuff Size: Normal)   Pulse 63   Temp 98.7 F (37.1 C) (Oral)   Ht 5\' 2"  (1.575 m)   Wt 124 lb 12.8 oz (56.6 kg)   SpO2 97%   BMI 22.83 kg/m    Subjective:    Patient ID: Lindsey Blair, female    DOB: [birth year], 74 y.o.   MRN: [id number]  HPI: Lindsey Blair is a 74 y.o. female  Chief Complaint  Patient presents with  . Hypertension  . Hyperlipidemia  . Depression  . Chronic Kidney Disease   HYPERTENSION / HYPERLIPIDEMIA Continues on Amlodipine, Benazepril, and Lipitor daily (last LDL in December was 76). December 2020 CRT 1.12 and GFR 49.   Satisfied with current treatment? yes Duration of hypertension: chronic BP monitoring frequency: daily BP range: 135/70 this morning and last night 117/68 BP medication side effects: no Duration of hyperlipidemia: chronic Cholesterol medication side effects: no Cholesterol supplements: none Medication compliance: good compliance Aspirin: no Recent stressors: no Recurrent headaches: no Visual changes: no Palpitations: no Dyspnea: no Chest pain: no Lower extremity edema: occasionally when on feet a lot during Dizzy/lightheaded: no   CHRONIC KIDNEY DISEASE Followed by nephrology and last seen by Dr. Abigail Butts 02/11/19.  Next visit in July. CKD status: stable Medications renally dose: yes Previous renal evaluation: yes Pneumovax:  Up to Date Influenza Vaccine:  Up to Date  DEPRESSION Continues on Trazodone, which benefits her insomnia and mood, plus Wellbutrin daily. Mood status: stable Satisfied with current treatment?: yes Symptom severity: mild  Duration of current treatment : chronic Side effects: no Medication compliance: good compliance Psychotherapy/counseling: none Depressed mood: no Anxious mood: no Anhedonia: no Significant weight loss or gain: no Insomnia: none Fatigue: no Feelings of worthlessness or guilt: no Impaired  concentration/indecisiveness: no Suicidal ideations: no Hopelessness: no Crying spells: no Depression screen [facility] 2/9 02/10/2020 08/12/2019 11/01/2018 08/07/2018 05/14/2018  Decreased Interest 0 0 0 0 0  Down, Depressed, Hopeless 0 0 0 0 0  PHQ - 2 Score 0 0 0 0 0  Altered sleeping 1 1 - 0 1  Tired, decreased energy 0 0 - 0 0  Change in appetite 0 0 - 0 0  Feeling bad or failure about yourself  0 0 - 0 0  Trouble concentrating 1 0 - 0 0  Moving slowly or fidgety/restless 0 0 - 0 0  Suicidal thoughts 0 0 - 0 0  PHQ-9 Score 2 1 - 0 1  Difficult doing work/chores Not difficult at all Not difficult at all - Not difficult at all Not difficult at all   BACK PAIN Loaded a bunch of hay bales recently.  Has had similar pain to right lower back.  Started severe last Tuesday, but is slowly getting better. Duration: weeks Mechanism of injury: lifting Location: Right Onset: gradual Severity: 10/10 Quality: dull, aching and throbbing Frequency: intermittent Radiation: none Aggravating factors: lifting, movement and walking Alleviating factors: rest, ice, heat, NSAIDs and APAP Status: better Treatments attempted: rest, ice, heat, APAP and ibuprofen  Relief with NSAIDs?: moderate Nighttime pain:  no Paresthesias / decreased sensation:  no Bowel / bladder incontinence:  no Fevers:  no Dysuria / urinary frequency:  no  Relevant past medical, surgical, family and social history reviewed and updated as indicated. Interim medical history since our last visit reviewed. Allergies and medications reviewed and updated.  Review of Systems  Constitutional: Negative for activity change, appetite change, diaphoresis,  fatigue and fever.  Respiratory: Negative for cough, chest tightness and shortness of breath.   Cardiovascular: Negative for chest pain, palpitations and leg swelling.  Gastrointestinal: Negative.   Neurological: Negative.   Psychiatric/Behavioral: Negative.     Per HPI unless specifically  indicated above     Objective:    BP 135/72 (BP Location: Left Arm, Patient Position: Sitting, Cuff Size: Normal)   Pulse 63   Temp 98.7 F (37.1 C) (Oral)   Ht 5\' 2"  (1.575 m)   Wt 124 lb 12.8 oz (56.6 kg)   SpO2 97%   BMI 22.83 kg/m   Wt Readings from Last 3 Encounters:  02/10/20 124 lb 12.8 oz (56.6 kg)  10/03/19 122 lb (55.3 kg)  08/12/19 126 lb (57.2 kg)    Physical Exam Vitals and nursing note reviewed.  Constitutional:      General: She is awake. She is not in acute distress.    Appearance: She is well-developed and well-groomed. She is not ill-appearing.  HENT:     Head: Normocephalic.     Right Ear: Hearing normal.     Left Ear: Hearing normal.  Eyes:     General: Lids are normal.        Right eye: No discharge.        Left eye: No discharge.     Conjunctiva/sclera: Conjunctivae normal.     Pupils: Pupils are equal, round, and reactive to light.  Neck:     Thyroid: No thyromegaly.     Vascular: No carotid bruit.  Cardiovascular:     Rate and Rhythm: Normal rate and regular rhythm.     Heart sounds: Normal heart sounds. No murmur. No gallop.   Pulmonary:     Effort: Pulmonary effort is normal. No accessory muscle usage or respiratory distress.     Breath sounds: Normal breath sounds.  Abdominal:     General: Bowel sounds are normal.     Palpations: Abdomen is soft.  Musculoskeletal:     Cervical back: Normal range of motion and neck supple.     Lumbar back: No swelling, edema, deformity, lacerations, spasms or tenderness. Normal range of motion. Negative right straight leg raise test and negative left straight leg raise test.     Right lower leg: No edema.     Left lower leg: No edema.  Skin:    General: Skin is warm and dry.  Neurological:     Mental Status: She is alert and oriented to person, place, and time.  Psychiatric:        Attention and Perception: Attention normal.        Mood and Affect: Mood normal.        Speech: Speech normal.         Behavior: Behavior normal. Behavior is cooperative.        Thought Content: Thought content normal.     Results for orders placed or performed in visit on 08/12/19  CBC with Differential/Platelet out  Result Value Ref Range   WBC 4.1 3.4 - 10.8 x10E3/uL   RBC 5.22 3.77 - 5.28 x10E6/uL   Hemoglobin 13.8 11.1 - 15.9 g/dL   Hematocrit 42.6 34.0 - 46.6 %   MCV 82 79 - 97 fL   MCH 26.4 (L) 26.6 - 33.0 pg   MCHC 32.4 31.5 - 35.7 g/dL   RDW 12.4 11.7 - 15.4 %   Platelets 188 150 - 450 x10E3/uL   Neutrophils 54 Not Estab. %  Lymphs 34 Not Estab. %   Monocytes 10 Not Estab. %   Eos 1 Not Estab. %   Basos 1 Not Estab. %   Neutrophils Absolute 2.2 1.4 - 7.0 x10E3/uL   Lymphocytes Absolute 1.4 0.7 - 3.1 x10E3/uL   Monocytes Absolute 0.4 0.1 - 0.9 x10E3/uL   EOS (ABSOLUTE) 0.0 0.0 - 0.4 x10E3/uL   Basophils Absolute 0.0 0.0 - 0.2 x10E3/uL   Immature Granulocytes 0 Not Estab. %   Immature Grans (Abs) 0.0 0.0 - 0.1 x10E3/uL  Comprehensive metabolic panel  Result Value Ref Range   Glucose 72 65 - 99 mg/dL   BUN 15 8 - 27 mg/dL   Creatinine, Ser 1.12 (H) 0.57 - 1.00 mg/dL   GFR calc non Af Amer 49 (L) >59 mL/min/1.73   GFR calc Af Amer 56 (L) >59 mL/min/1.73   BUN/Creatinine Ratio 13 12 - 28   Sodium 140 134 - 144 mmol/L   Potassium 4.2 3.5 - 5.2 mmol/L   Chloride 103 96 - 106 mmol/L   CO2 23 20 - 29 mmol/L   Calcium 9.5 8.7 - 10.3 mg/dL   Total Protein 6.7 6.0 - 8.5 g/dL   Albumin 4.4 3.7 - 4.7 g/dL   Globulin, Total 2.3 1.5 - 4.5 g/dL   Albumin/Globulin Ratio 1.9 1.2 - 2.2   Bilirubin Total 0.4 0.0 - 1.2 mg/dL   Alkaline Phosphatase 69 39 - 117 IU/L   AST 24 0 - 40 IU/L   ALT 17 0 - 32 IU/L  TSH  Result Value Ref Range   TSH 0.626 0.450 - 4.500 uIU/mL  Lipid Panel w/o Chol/HDL Ratio out  Result Value Ref Range   Cholesterol, Total 166 100 - 199 mg/dL   Triglycerides 49 0 - 149 mg/dL   HDL 80 >39 mg/dL   VLDL Cholesterol Cal 10 5 - 40 mg/dL   LDL Chol Calc (NIH) 76 0 - 99  mg/dL  Vit D  25 hydroxy (rtn osteoporosis monitoring)  Result Value Ref Range   Vit D, 25-Hydroxy 36.1 30.0 - 100.0 ng/mL  SAR CoV2 Serology (COVID 19)AB(IGG)IA  Result Value Ref Range   DiaSorin SARS-CoV-2 Ab, IgG Negative Negative      Assessment & Plan:   Problem List Items Addressed This Visit      Cardiovascular and Mediastinum   Hypertension    Chronic, stable with BP at goal in office and home readings at goal.  Continue combo pill for Benazepril-Amlodipine, will send script and see if covered as she would prefer combo pill, if not return to separate pills.  Continue current medication regimen and checking BP at home.  BMP today.  Return in 6 months for physical.      Relevant Medications   amLODipine-benazepril (LOTREL) 10-20 MG capsule   Other Relevant Orders   Basic metabolic panel     Genitourinary   CKD (chronic kidney disease) stage 3, GFR 30-59 ml/min    Chronic, ongoing.  Continue collaboration with nephrology and Benazepril for kidney protection.  BMP today.        Other   Hyperlipidemia    Chronic, ongoing.  Continue current medication regimen and adjust as needed.  Lipid panel today.      Relevant Medications   amLODipine-benazepril (LOTREL) 10-20 MG capsule   Other Relevant Orders   Lipid Panel w/o Chol/HDL Ratio   Depression - Primary    Chronic, stable.  Denies SI/HI.  Continue current medication regimen and adjust as needed. Return  in 6 months.      Insomnia    Chronic, stable.  Continue current medication regimen and adjust as needed.  Trazodone benefits mood and sleep.      Back pain    Waxes and wanes, current flare which is gradually improving.  Will send in script for Tizanidine, suspect musculoskeletal as no red flags on exam.  Recommend use of Tylenol at home as needed + alternating ice and heat.  Use of OTC Voltaren and Icy/Hot gel as needed.  Return to office for worsening of ongoing pain.      Relevant Medications   tiZANidine  (ZANAFLEX) 4 MG tablet       Follow up plan: Return in about 6 months (around 08/11/2020) for Annual physical.

## 2020-02-10 NOTE — Assessment & Plan Note (Signed)
Waxes and wanes, current flare which is gradually improving.  Will send in script for Tizanidine, suspect musculoskeletal as no red flags on exam.  Recommend use of Tylenol at home as needed + alternating ice and heat.  Use of OTC Voltaren and Icy/Hot gel as needed.  Return to office for worsening of ongoing pain.

## 2020-02-10 NOTE — Assessment & Plan Note (Signed)
Chronic, stable with BP at goal in office and home readings at goal.  Continue combo pill for Benazepril-Amlodipine, will send script and see if covered as she would prefer combo pill, if not return to separate pills.  Continue current medication regimen and checking BP at home.  BMP today.  Return in 6 months for physical.

## 2020-02-11 LAB — LIPID PANEL W/O CHOL/HDL RATIO
Cholesterol, Total: 167 mg/dL (ref 100–199)
HDL: 74 mg/dL (ref >39)
LDL Chol Calc (NIH): 81 mg/dL (ref 0–99)
Triglycerides: 61 mg/dL (ref 0–149)
VLDL Cholesterol Cal: 12 mg/dL (ref 5–40)

## 2020-02-11 LAB — BASIC METABOLIC PANEL
BUN/Creatinine Ratio: 15 (ref 12–28)
BUN: 15 mg/dL (ref 8–27)
CO2: 22 mmol/L (ref 20–29)
Calcium: 9.8 mg/dL (ref 8.7–10.3)
Chloride: 104 mmol/L (ref 96–106)
Creatinine, Ser: 1.03 mg/dL — ABNORMAL HIGH (ref 0.57–1.00)
GFR calc Af Amer: 62 mL/min/{1.73_m2} (ref >59)
GFR calc non Af Amer: 54 mL/min/{1.73_m2} — ABNORMAL LOW (ref >59)
Glucose: 89 mg/dL (ref 65–99)
Potassium: 4.4 mmol/L (ref 3.5–5.2)
Sodium: 143 mmol/L (ref 134–144)

## 2020-02-11 NOTE — Progress Notes (Signed)
Please let Romie Minus know [place]!!  Plus her kidney function continues to show some mild kidney disease, but improved from last visit.  This is great!!  Cholesterol levels look great.  Enjoy some cake and have a wonderful day!!

## 2020-03-03 ENCOUNTER — Ambulatory Visit
Admission: RE | Admit: 2020-03-03 | Discharge: 2020-03-03 | Disposition: A | Discharge status: Home / Self Care | Payer: PPO | Source: Ambulatory Visit | Attending: Nurse Practitioner | Admitting: Nurse Practitioner

## 2020-03-03 DIAGNOSIS — Z1231 Encounter for screening mammogram for malignant neoplasm of breast: Secondary | ICD-10-CM | POA: Diagnosis not present

## 2020-03-10 DIAGNOSIS — R809 Proteinuria, unspecified: Secondary | ICD-10-CM | POA: Diagnosis not present

## 2020-03-10 DIAGNOSIS — R319 Hematuria, unspecified: Secondary | ICD-10-CM | POA: Diagnosis not present

## 2020-03-10 DIAGNOSIS — N1831 Chronic kidney disease, stage 3a: Secondary | ICD-10-CM | POA: Diagnosis not present

## 2020-03-10 DIAGNOSIS — I129 Hypertensive chronic kidney disease with stage 1 through stage 4 chronic kidney disease, or unspecified chronic kidney disease: Secondary | ICD-10-CM | POA: Diagnosis not present

## 2020-03-10 DIAGNOSIS — M329 Systemic lupus erythematosus, unspecified: Secondary | ICD-10-CM | POA: Diagnosis not present

## 2020-03-23 ENCOUNTER — Other Ambulatory Visit: Payer: Self-pay | Admitting: Nurse Practitioner

## 2020-03-23 ENCOUNTER — Other Ambulatory Visit: Payer: Self-pay | Admitting: Family Medicine

## 2020-03-23 MED ORDER — TESTOSTERONE 1.62 % TD GEL: 1.0000 "application " | TRANSDERMAL | 2 refills | Status: DC

## 2020-03-23 NOTE — Telephone Encounter (Signed)
Medication Refill - Medication: Testosterone 1.62 % GEL   Has the patient contacted their pharmacy? Yes.   (Agent: If no, request that the patient contact the pharmacy for the refill.) (Agent: If yes, when and what did the pharmacy advise?) Pcp woudlnt refill   Preferred Pharmacy (with phone number or street name): [place], [place] - [place]  [place], [place] [address]  Phone:  [phone] Fax:  [phone]   Agent: Please be advised that RX refills may take up to 3 business days. We ask that you follow-up with your pharmacy.

## 2020-03-23 NOTE — Telephone Encounter (Signed)
Routing to provider. Patient last seen 02/10/20 and has appointment 08/11/20

## 2020-04-29 DIAGNOSIS — D225 Melanocytic nevi of trunk: Secondary | ICD-10-CM | POA: Diagnosis not present

## 2020-04-29 DIAGNOSIS — X32XXXA Exposure to sunlight, initial encounter: Secondary | ICD-10-CM | POA: Diagnosis not present

## 2020-04-29 DIAGNOSIS — C44329 Squamous cell carcinoma of skin of other parts of face: Secondary | ICD-10-CM | POA: Diagnosis not present

## 2020-04-29 DIAGNOSIS — D2262 Melanocytic nevi of left upper limb, including shoulder: Secondary | ICD-10-CM | POA: Diagnosis not present

## 2020-04-29 DIAGNOSIS — D2271 Melanocytic nevi of right lower limb, including hip: Secondary | ICD-10-CM | POA: Diagnosis not present

## 2020-04-29 DIAGNOSIS — L570 Actinic keratosis: Secondary | ICD-10-CM | POA: Diagnosis not present

## 2020-04-29 DIAGNOSIS — D485 Neoplasm of uncertain behavior of skin: Secondary | ICD-10-CM | POA: Diagnosis not present

## 2020-04-29 DIAGNOSIS — L821 Other seborrheic keratosis: Secondary | ICD-10-CM | POA: Diagnosis not present

## 2020-04-29 DIAGNOSIS — D2261 Melanocytic nevi of right upper limb, including shoulder: Secondary | ICD-10-CM | POA: Diagnosis not present

## 2020-04-29 DIAGNOSIS — Z85828 Personal history of other malignant neoplasm of skin: Secondary | ICD-10-CM | POA: Diagnosis not present

## 2020-05-05 ENCOUNTER — Other Ambulatory Visit: Payer: Self-pay | Admitting: Nurse Practitioner

## 2020-05-05 ENCOUNTER — Telehealth: Payer: Self-pay | Admitting: Nurse Practitioner

## 2020-05-05 DIAGNOSIS — M545 Low back pain: Secondary | ICD-10-CM | POA: Diagnosis not present

## 2020-05-05 MED ORDER — TIZANIDINE HCL 4 MG PO TABS: 4.0000 mg | ORAL_TABLET | Freq: Four times a day (QID) | ORAL | 2 refills | Status: DC | PRN

## 2020-05-05 NOTE — Telephone Encounter (Signed)
Have sent in refills on her Tizanidine to help with back pain, but do recommend she maintain visit as schedule and if worsening immediately go to urgent care.

## 2020-05-05 NOTE — Telephone Encounter (Signed)
Called pt to schedule an appt, next available is 05/07/20 pt states that she has a back ache and will die by then. Pt states that Jolene can just send her in something to the pharmacy. Advised pt of UC option pt states that she was informed that she could do a virtual, I explained to her that there is no availability she states that she wanted to speak with Jolene today. Advised her that we can schedule virtual appt for 9/2 she states well how about I invite you all to my funeral and hung up.    Copied from [place] [phone]. Topic: General - Inquiry >> May 05, 2020  9:55 AM Percell Belt A wrote: Reason for CRM: pt called in and stated the back ache she had is back and she stated she needs some help. She would like to know if Jolene could call in something for her.  She stated it started back up again yesterday  Best number [facility]

## 2020-05-06 NOTE — Telephone Encounter (Signed)
Noted  

## 2020-05-06 NOTE — Telephone Encounter (Signed)
Called pt she states that she did go to [facility] yesterday and was able to get an x-ray of her back. She states that it was fine it's as if she pulled a muscle. Wants to thank Jolene for sending rx.

## 2020-06-03 DIAGNOSIS — C44329 Squamous cell carcinoma of skin of other parts of face: Secondary | ICD-10-CM | POA: Diagnosis not present

## 2020-08-10 ENCOUNTER — Ambulatory Visit (INDEPENDENT_AMBULATORY_CARE_PROVIDER_SITE_OTHER): Payer: PPO

## 2020-08-10 VITALS — Ht 62.0 in | Wt 122.0 lb

## 2020-08-10 DIAGNOSIS — Z0000 Encounter for general adult medical examination without abnormal findings: Secondary | ICD-10-CM | POA: Diagnosis not present

## 2020-08-10 NOTE — Patient Instructions (Signed)
Ms. Lindsey Blair , Thank you for taking time to come for your Medicare Wellness Visit. I appreciate your ongoing commitment to your health goals. Please review the following plan we discussed and let me know if I can assist you in the future.   Screening recommendations/referrals: Colonoscopy: completed 06/20/2016 Mammogram: completed 02/2020 Bone Density: completed 02/28/2019 Recommended yearly ophthalmology/optometry visit for glaucoma screening and checkup Recommended yearly dental visit for hygiene and checkup  Vaccinations: Influenza vaccine: completed 06/11/2020 Pneumococcal vaccine: completed 04/09/2014 Tdap vaccine: completed 10/08/2015 Shingles vaccine: completed   Covid-19:10/22/2019, 11/19/2019, 06/11/2020  Advanced directives: Please bring a copy of your POA (Power of Attorney) and/or Living Will to your next appointment.   Conditions/risks identified: none  Next appointment: Follow up in one year for your annual wellness visit    Preventive Care 65 Years and Older, Female Preventive care refers to lifestyle choices and visits with your health care provider that can promote health and wellness. What does preventive care include?  A yearly physical exam. This is also called an annual well check.  Dental exams once or twice a year.  Routine eye exams. Ask your health care provider how often you should have your eyes checked.  Personal lifestyle choices, including:  Daily care of your teeth and gums.  Regular physical activity.  Eating a healthy diet.  Avoiding tobacco and drug use.  Limiting alcohol use.  Practicing safe sex.  Taking low-dose aspirin every day.  Taking vitamin and mineral supplements as recommended by your health care provider. What happens during an annual well check? The services and screenings done by your health care provider during your annual well check will depend on your age, overall health, lifestyle risk factors, and family history of  disease. Counseling  Your health care provider may ask you questions about your:  Alcohol use.  Tobacco use.  Drug use.  Emotional well-being.  Home and relationship well-being.  Sexual activity.  Eating habits.  History of falls.  Memory and ability to understand (cognition).  Work and work Statistician.  Reproductive health. Screening  You may have the following tests or measurements:  Height, weight, and BMI.  Blood pressure.  Lipid and cholesterol levels. These may be checked every 5 years, or more frequently if you are over 47 years old.  Skin check.  Lung cancer screening. You may have this screening every year starting at age 64 if you have a 30-pack-year history of smoking and currently smoke or have quit within the past 15 years.  Fecal occult blood test (FOBT) of the stool. You may have this test every year starting at age 60.  Flexible sigmoidoscopy or colonoscopy. You may have a sigmoidoscopy every 5 years or a colonoscopy every 10 years starting at age 71.  Hepatitis C blood test.  Hepatitis B blood test.  Sexually transmitted disease (STD) testing.  Diabetes screening. This is done by checking your blood sugar (glucose) after you have not eaten for a while (fasting). You may have this done every 1-3 years.  Bone density scan. This is done to screen for osteoporosis. You may have this done starting at age 67.  Mammogram. This may be done every 1-2 years. Talk to your health care provider about how often you should have regular mammograms. Talk with your health care provider about your test results, treatment options, and if necessary, the need for more tests. Vaccines  Your health care provider may recommend certain vaccines, such as:  Influenza vaccine. This is recommended every  year.  Tetanus, diphtheria, and acellular pertussis (Tdap, Td) vaccine. You may need a Td booster every 10 years.  Zoster vaccine. You may need this after age  42.  Pneumococcal 13-valent conjugate (PCV13) vaccine. One dose is recommended after age 54.  Pneumococcal polysaccharide (PPSV23) vaccine. One dose is recommended after age 89. Talk to your health care provider about which screenings and vaccines you need and how often you need them. This information is not intended to replace advice given to you by your health care provider. Make sure you discuss any questions you have with your health care provider. Document Released: 09/18/2015 Document Revised: 05/11/2016 Document Reviewed: 06/23/2015 Elsevier Interactive Patient Education  2017 [employer] Prevention in the Home Falls can cause injuries. They can happen to people of all ages. There are many things you can do to make your home safe and to help prevent falls. What can I do on the outside of my home?  Regularly fix the edges of walkways and driveways and fix any cracks.  Remove anything that might make you trip as you walk through a door, such as a raised step or threshold.  Trim any bushes or trees on the path to your home.  Use bright outdoor lighting.  Clear any walking paths of anything that might make someone trip, such as rocks or tools.  Regularly check to see if handrails are loose or broken. Make sure that both sides of any steps have handrails.  Any raised decks and porches should have guardrails on the edges.  Have any leaves, snow, or ice cleared regularly.  Use sand or salt on walking paths during winter.  Clean up any spills in your garage right away. This includes oil or grease spills. What can I do in the bathroom?  Use night lights.  Install grab bars by the toilet and in the tub and shower. Do not use towel bars as grab bars.  Use non-skid mats or decals in the tub or shower.  If you need to sit down in the shower, use a plastic, non-slip stool.  Keep the floor dry. Clean up any water that spills on the floor as soon as it happens.  Remove  soap buildup in the tub or shower regularly.  Attach bath mats securely with double-sided non-slip rug tape.  Do not have throw rugs and other things on the floor that can make you trip. What can I do in the bedroom?  Use night lights.  Make sure that you have a light by your bed that is easy to reach.  Do not use any sheets or blankets that are too big for your bed. They should not hang down onto the floor.  Have a firm chair that has side arms. You can use this for support while you get dressed.  Do not have throw rugs and other things on the floor that can make you trip. What can I do in the kitchen?  Clean up any spills right away.  Avoid walking on wet floors.  Keep items that you use a lot in easy-to-reach places.  If you need to reach something above you, use a strong step stool that has a grab bar.  Keep electrical cords out of the way.  Do not use floor polish or wax that makes floors slippery. If you must use wax, use non-skid floor wax.  Do not have throw rugs and other things on the floor that can make you trip. What can  I do with my stairs?  Do not leave any items on the stairs.  Make sure that there are handrails on both sides of the stairs and use them. Fix handrails that are broken or loose. Make sure that handrails are as long as the stairways.  Check any carpeting to make sure that it is firmly attached to the stairs. Fix any carpet that is loose or worn.  Avoid having throw rugs at the top or bottom of the stairs. If you do have throw rugs, attach them to the floor with carpet tape.  Make sure that you have a light switch at the top of the stairs and the bottom of the stairs. If you do not have them, ask someone to add them for you. What else can I do to help prevent falls?  Wear shoes that:  Do not have high heels.  Have rubber bottoms.  Are comfortable and fit you well.  Are closed at the toe. Do not wear sandals.  If you use a  stepladder:  Make sure that it is fully opened. Do not climb a closed stepladder.  Make sure that both sides of the stepladder are locked into place.  Ask someone to hold it for you, if possible.  Clearly mark and make sure that you can see:  Any grab bars or handrails.  First and last steps.  Where the edge of each step is.  Use tools that help you move around (mobility aids) if they are needed. These include:  Canes.  Walkers.  Scooters.  Crutches.  Turn on the lights when you go into a dark area. Replace any light bulbs as soon as they burn out.  Set up your furniture so you have a clear path. Avoid moving your furniture around.  If any of your floors are uneven, fix them.  If there are any pets around you, be aware of where they are.  Review your medicines with your doctor. Some medicines can make you feel dizzy. This can increase your chance of falling. Ask your doctor what other things that you can do to help prevent falls. This information is not intended to replace advice given to you by your health care provider. Make sure you discuss any questions you have with your health care provider. Document Released: 06/18/2009 Document Revised: 01/28/2016 Document Reviewed: 09/26/2014 Elsevier Interactive Patient Education  2017 [employer].

## 2020-08-10 NOTE — Progress Notes (Signed)
I connected with Lindsey Blair today by telephone and verified that I am speaking with the correct person using two identifiers. Location patient: home Location provider: work Persons participating in the virtual visit: Ammanda Collier, Monica LPN.   I discussed the limitations, risks, security and privacy concerns of performing an evaluation and management service by telephone and the availability of in person appointments. I also discussed with the patient that there may be a patient responsible charge related to this service. The patient expressed understanding and verbally consented to this telephonic visit.    Interactive audio and video telecommunications were attempted between this provider and patient, however failed, due to patient having technical difficulties OR patient did not have access to video capability.  We continued and completed visit with audio only.     Vital signs may be patient reported or missing.  Subjective:   Lindsey Blair is a 74 y.o. female who presents for Medicare Annual (Subsequent) preventive examination.  Review of Systems     Cardiac Risk Factors include: advanced age (>54men, >57 women);hypertension;dyslipidemia     Objective:    Today's Vitals   08/10/20 0856  Weight: 122 lb (55.3 kg)  Height: 5\' 2"  (1.575 m)   Body mass index is 22.31 kg/m.  Advanced Directives 08/10/2020 11/01/2018 02/17/2017 10/22/2015  Does Patient Have a Medical Advance Directive? Yes Yes No No  Type of Paramedic of [place];Living will [place];Living will - -  Copy of [place] in Chart? No - copy requested No - copy requested - -  Would patient like information on creating a medical advance directive? - - Yes (MAU/Ambulatory/Procedural Areas - Information given) No - patient declined information    Current Medications (verified) Outpatient Encounter Medications as of 08/10/2020  Medication Sig  .  amLODipine-benazepril (LOTREL) 10-20 MG capsule Take 1 capsule by mouth daily.  Marland Kitchen buPROPion (WELLBUTRIN SR) 150 MG 12 hr tablet Take 1 tablet (150 mg total) by mouth 2 (two) times daily.  Marland Kitchen estradiol (ESTRACE VAGINAL) 0.1 MG/GM vaginal cream Place 1 Applicatorful vaginally at bedtime. Place pea-sized amount vaginally at bedtime.  . hydroxychloroquine (PLAQUENIL) 200 MG tablet Take 200 mg by mouth daily.  Marland Kitchen MELATONIN PO Take 1 Dose by mouth at bedtime.  . Multiple Vitamin (MULTI-VITAMIN DAILY PO) Take by mouth.  . pantoprazole (PROTONIX) 40 MG tablet Take 1 tablet (40 mg total) by mouth daily.  . Testosterone 1.62 % GEL Place 1 application onto the skin 3 (three) times a week. I am unable to make the computer do a 4% gel but please use  . tiZANidine (ZANAFLEX) 4 MG tablet Take 1 tablet (4 mg total) by mouth every 6 (six) hours as needed for muscle spasms.  . traZODone (DESYREL) 50 MG tablet Take 1 tablet (50 mg total) by mouth at bedtime.  Marland Kitchen alendronate (FOSAMAX) 35 MG tablet Take 1 tablet (35 mg total) by mouth every 7 (seven) days. Take with a full glass of water on an empty stomach. (Patient not taking: Reported on 08/10/2020)  . atorvastatin (LIPITOR) 20 MG tablet Take 1 tablet (20 mg total) by mouth daily. (Patient not taking: Reported on 08/10/2020)   No facility-administered encounter medications on file as of 08/10/2020.    Allergies (verified) Penicillins   History: Past Medical History:  Diagnosis Date  . Anxiety   . Cancer [facility]) Several instances of the years   Skin  . Chronic kidney disease   . Cutaneous  lupus erythematosus   . Depression   . Hyperlipidemia   . Hypertension   . Vertigo    rare   Past Surgical History:  Procedure Laterality Date  . BREAST BIOPSY Right 2000   Negative  . CATARACT EXTRACTION Right 08/05/2019  . COLONOSCOPY WITH PROPOFOL N/A 10/22/2015   Procedure: COLONOSCOPY WITH PROPOFOL;  Surgeon: Lucilla Lame, MD;  Location: [facility];   Service: Endoscopy;  Laterality: N/A;  . ESOPHAGOGASTRODUODENOSCOPY (EGD) WITH PROPOFOL N/A 10/22/2015   Procedure: ESOPHAGOGASTRODUODENOSCOPY (EGD) WITH PROPOFOL;  Surgeon: Lucilla Lame, MD;  Location: [facility];  Service: Endoscopy;  Laterality: N/A;  . IMAGE GUIDED SINUS SURGERY  04/06/11   [facility], Dr. Pryor Ochoa  . MASTOIDECTOMY     Family History  Problem Relation Age of Onset  . Heart disease Mother   . Leukemia Father   . Stroke Father   . Cerebral aneurysm Sister   . Prostate cancer Brother   . Prostate cancer Brother   . Prostate cancer Brother   . Lung cancer Sister   . Bladder Cancer Sister   . Liver cancer Sister   . Brain cancer Sister   . Heart attack Sister   . Breast cancer Neg Hx    Social History   Socioeconomic History  . Marital status: Married    Spouse name: Not on file  . Number of children: Not on file  . Years of education: Not on file  . Highest education level: Some college, no degree  Occupational History  . Occupation: retired   Tobacco Use  . Smoking status: Former Smoker    Types: Cigarettes    Quit date: 03/08/1965    Years since quitting: 55.4  . Smokeless tobacco: Never Used  Vaping Use  . Vaping Use: Never used  Substance and Sexual Activity  . Alcohol use: Yes    Alcohol/week: 5.0 standard drinks    Types: 2 Glasses of wine, 3 Shots of liquor per week  . Drug use: No  . Sexual activity: Not on file  Other Topics Concern  . Not on file  Social History Narrative  . Not on file   Social Determinants of Health   Financial Resource Strain: Low Risk   . Difficulty of Paying Living Expenses: Not hard at all  Food Insecurity: No Food Insecurity  . Worried About Charity fundraiser in the Last Year: Never true  . Ran Out of Food in the Last Year: Never true  Transportation Needs: No Transportation Needs  . Lack of Transportation (Medical): No  . Lack of Transportation (Non-Medical): No  Physical Activity: Inactive  . Days of  Exercise per Week: 0 days  . Minutes of Exercise per Session: 0 min  Stress: No Stress Concern Present  . Feeling of Stress : Not at all  Social Connections:   . Frequency of Communication with Friends and Family: Not on file  . Frequency of Social Gatherings with Friends and Family: Not on file  . Attends Religious Services: Not on file  . Active Member of Clubs or Organizations: Not on file  . Attends Archivist Meetings: Not on file  . Marital Status: Not on file    Tobacco Counseling Counseling given: Not Answered   Clinical Intake:  Pre-visit preparation completed: Yes  Pain : No/denies pain     Nutritional Status: BMI of 19-24  Normal Nutritional Risks: None  How often do you need to have someone help you when you read  instructions, pamphlets, or other written materials from your doctor or pharmacy?: 1 - Never What is the last grade level you completed in school?: some college  Diabetic? no  Interpreter Needed?: No  Information entered by :: NAllen LPN   Activities of Daily Living In your present state of health, do you have any difficulty performing the following activities: 08/10/2020 08/12/2019  Hearing? Y Y  Comment a little -  Vision? N N  Difficulty concentrating or making decisions? N N  Walking or climbing stairs? N N  Dressing or bathing? N N  Doing errands, shopping? N N  Preparing Food and eating ? N -  Using the Toilet? N -  In the past six months, have you accidently leaked urine? N -  Do you have problems with loss of bowel control? N -  Managing your Medications? N -  Managing your Finances? N -  Housekeeping or managing your Housekeeping? N -  Some recent data might be hidden    Patient Care Team: Guadalupe Maple, MD as PCP - General (Family Medicine) Lucilla Lame, MD as Consulting Physician (Gastroenterology) Lavonia Dana, MD as Consulting Physician (Internal Medicine) Emmaline Kluver., MD (Rheumatology)  Indicate  any recent Medical Services you may have received from other than [facility] providers in the past year (date may be approximate).     Assessment:   This is a routine wellness examination for C[place]ith.  Hearing/Vision screen  Hearing Screening   125Hz  250Hz  500Hz  1000Hz  2000Hz  3000Hz  4000Hz  6000Hz  8000Hz   Right ear:           Left ear:           Vision Screening Comments: Regular eye exams, Dr. Glennon Mac  Dietary issues and exercise activities discussed: Current Exercise Habits: The patient does not participate in regular exercise at present  Goals    . Increase water intake     Recommend drinking at least 3-4 glasses of water a day     . Patient Stated     08/10/2020, cut down on cholesterol medication      Depression Screen PHQ 2/9 Scores 08/10/2020 02/10/2020 08/12/2019 11/01/2018 08/07/2018 05/14/2018 06/12/2017  PHQ - 2 Score 0 0 0 0 0 0 0  PHQ- 9 Score 1 2 1  - 0 1 -    Fall Risk Fall Risk  08/10/2020 08/12/2019 11/01/2018 08/07/2018 05/14/2018  Falls in the past year? 0 0 0 0 No  Number falls in past yr: - 0 - 0 -  Injury with Fall? - 0 - 0 -  Risk for fall due to : Medication side effect - - - -  Follow up Falls evaluation completed;Education provided;Falls prevention discussed Falls evaluation completed - - -    FALL RISK PREVENTION PERTAINING TO THE HOME:  Any stairs in or around the home? Yes  If so, are there any without handrails? No  Home free of loose throw rugs in walkways, pet beds, electrical cords, etc? Yes  Adequate lighting in your home to reduce risk of falls? Yes   ASSISTIVE DEVICES UTILIZED TO PREVENT FALLS:  Life alert? No  Use of a cane, walker or w/c? No  Grab bars in the bathroom? No  Shower chair or bench in shower? No  Elevated toilet seat or a handicapped toilet? No   TIMED UP AND GO:  Was the test performed? No . .  Cognitive Function:     6CIT Screen 08/10/2020 02/17/2017  What Year? 0 points 0 points  What month? 0 points 0 points  What time? 0  points 0 points  Count back from 20 2 points 0 points  Months in reverse 0 points 0 points  Repeat phrase 2 points 0 points  Total Score 4 0    Immunizations Immunization History  Administered Date(s) Administered  . Influenza, High Dose Seasonal PF 05/14/2018  . Influenza,inj,quad, With Preservative 06/17/2019  . Influenza-Unspecified 06/11/2015, 06/24/2016, 06/08/2017, 06/11/2020  . Moderna SARS-COVID-2 Vaccination 10/22/2019, 11/19/2019  . Pneumococcal Conjugate-13 04/09/2014  . Pneumococcal-Unspecified 10/03/2011  . Td 08/25/2005, 10/08/2015  . Zoster 08/27/2008  . Zoster Recombinat (Shingrix) 11/08/2018, 02/25/2019    TDAP status: Up to date  Flu Vaccine status: Up to date  Pneumococcal vaccine status: Up to date  Covid-19 vaccine status: Completed vaccines  Qualifies for Shingles Vaccine? Yes   Zostavax completed Yes   Shingrix Completed?: Yes  Screening Tests Health Maintenance  Topic Date Due  . TETANUS/TDAP  10/07/2025  . COLONOSCOPY  06/20/2026  . INFLUENZA VACCINE  Completed  . DEXA SCAN  Completed  . COVID-19 Vaccine  Completed  . Hepatitis C Screening  Completed  . PNA vac Low Risk Adult  Completed    Health Maintenance  There are no preventive care reminders to display for this patient.  Colorectal cancer screening: Type of screening: Colonoscopy. Completed 06/20/2016. Repeat every 10 years  Mammogram status: Completed 02/2020. Repeat every year  Bone Density status: Completed 02/28/2019. Results reflect: Bone density results: OSTEOPOROSIS. Repeat every 2 years.  Lung Cancer Screening: (Low Dose CT Chest recommended if Age 47-80 years, 30 pack-year currently smoking OR have quit w/in 15years.) does not qualify.   Lung Cancer Screening Referral: no  Additional Screening:  Hepatitis C Screening: does qualify; Completed 01/06/2016  Vision Screening: Recommended annual ophthalmology exams for early detection of glaucoma and other disorders of the  eye. Is the patient up to date with their annual eye exam?  Yes  Who is the provider or what is the name of the office in which the patient attends annual eye exams? Dr. Glennon Mac If pt is not established with a provider, would they like to be referred to a provider to establish care? No .   Dental Screening: Recommended annual dental exams for proper oral hygiene  Community Resource Referral / Chronic Care Management: CRR required this visit?  No   CCM required this visit?  No      Plan:     I have personally reviewed and noted the following in the patient's chart:   . Medical and social history . Use of alcohol, tobacco or illicit drugs  . Current medications and supplements . Functional ability and status . Nutritional status . Physical activity . Advanced directives . List of other physicians . Hospitalizations, surgeries, and ER visits in previous 12 months . Vitals . Screenings to include cognitive, depression, and falls . Referrals and appointments  In addition, I have reviewed and discussed with patient certain preventive protocols, quality metrics, and best practice recommendations. A written personalized care plan for preventive services as well as general preventive health recommendations were provided to patient.     Kellie Simmering, LPN   [id number]   Nurse Notes:

## 2020-08-11 ENCOUNTER — Encounter: Payer: Self-pay | Admitting: Nurse Practitioner

## 2020-08-11 ENCOUNTER — Other Ambulatory Visit: Payer: Self-pay

## 2020-08-11 ENCOUNTER — Ambulatory Visit (INDEPENDENT_AMBULATORY_CARE_PROVIDER_SITE_OTHER): Payer: PPO | Admitting: Nurse Practitioner

## 2020-08-11 VITALS — BP 135/75 | HR 54 | Temp 98.1°F | Ht 63.39 in | Wt 127.8 lb

## 2020-08-11 DIAGNOSIS — K2100 Gastro-esophageal reflux disease with esophagitis, without bleeding: Secondary | ICD-10-CM

## 2020-08-11 DIAGNOSIS — M545 Low back pain, unspecified: Secondary | ICD-10-CM

## 2020-08-11 DIAGNOSIS — E559 Vitamin D deficiency, unspecified: Secondary | ICD-10-CM | POA: Diagnosis not present

## 2020-08-11 DIAGNOSIS — N952 Postmenopausal atrophic vaginitis: Secondary | ICD-10-CM

## 2020-08-11 DIAGNOSIS — F3342 Major depressive disorder, recurrent, in full remission: Secondary | ICD-10-CM

## 2020-08-11 DIAGNOSIS — I10 Essential (primary) hypertension: Secondary | ICD-10-CM

## 2020-08-11 DIAGNOSIS — M85852 Other specified disorders of bone density and structure, left thigh: Secondary | ICD-10-CM | POA: Diagnosis not present

## 2020-08-11 DIAGNOSIS — Z0000 Encounter for general adult medical examination without abnormal findings: Secondary | ICD-10-CM | POA: Diagnosis not present

## 2020-08-11 DIAGNOSIS — N1831 Chronic kidney disease, stage 3a: Secondary | ICD-10-CM | POA: Diagnosis not present

## 2020-08-11 DIAGNOSIS — F5104 Psychophysiologic insomnia: Secondary | ICD-10-CM | POA: Diagnosis not present

## 2020-08-11 DIAGNOSIS — E7800 Pure hypercholesterolemia, unspecified: Secondary | ICD-10-CM | POA: Diagnosis not present

## 2020-08-11 DIAGNOSIS — M329 Systemic lupus erythematosus, unspecified: Secondary | ICD-10-CM

## 2020-08-11 DIAGNOSIS — M85851 Other specified disorders of bone density and structure, right thigh: Secondary | ICD-10-CM | POA: Diagnosis not present

## 2020-08-11 LAB — MICROALBUMIN, URINE WAIVED
Creatinine, Urine Waived: 50 mg/dL (ref 10–300)
Microalb, Ur Waived: 10 mg/L (ref 0–19)
Microalb/Creat Ratio: <30 mg/g

## 2020-08-11 MED ORDER — BUPROPION HCL ER (SR) 150 MG PO TB12: 150.0000 mg | ORAL_TABLET | Freq: Two times a day (BID) | ORAL | 4 refills | Status: DC

## 2020-08-11 MED ORDER — PANTOPRAZOLE SODIUM 40 MG PO TBEC: DELAYED_RELEASE_TABLET | ORAL | 4 refills | Status: DC

## 2020-08-11 MED ORDER — TIZANIDINE HCL 4 MG PO TABS: 4.0000 mg | ORAL_TABLET | Freq: Four times a day (QID) | ORAL | 2 refills | Status: DC | PRN

## 2020-08-11 MED ORDER — PREDNISONE 10 MG PO TABS: ORAL_TABLET | ORAL | 0 refills | Status: DC

## 2020-08-11 MED ORDER — TESTOSTERONE 1.62 % TD GEL: 1.0000 "application " | TRANSDERMAL | 2 refills | Status: DC

## 2020-08-11 MED ORDER — ZOLPIDEM TARTRATE 5 MG PO TABS: 5.0000 mg | ORAL_TABLET | Freq: Every evening | ORAL | 3 refills | Status: DC | PRN

## 2020-08-11 MED ORDER — AMLODIPINE BESY-BENAZEPRIL HCL 10-20 MG PO CAPS: 1.0000 | ORAL_CAPSULE | Freq: Every day | ORAL | 4 refills | Status: DC

## 2020-08-11 NOTE — Assessment & Plan Note (Signed)
Chronic, stable with BP at goal in office and home readings at goal.  Continue combo pill for Benazepril-Amlodipine and adjust as needed.  Continue current medication regimen and checking BP at home + focus on DASH diet.  CMP and TSH today.  Return in 6 months for follow-up to meet new PCP.

## 2020-08-11 NOTE — Assessment & Plan Note (Signed)
Chronic, stable.  Denies SI/HI.  Continue current medication regimen and adjust as needed. Refills sent in.  Return in 6 months for follow-up to meet new PCP.

## 2020-08-11 NOTE — Assessment & Plan Note (Signed)
Waxes and wanes, current flare.  Will send in script for Tizanidine and Prednisone taper, suspect musculoskeletal as no red flags on exam.  Recommend use of Tylenol at home as needed + alternating ice and heat.  Use of OTC Voltaren and Icy/Hot gel as needed.  Return to office for worsening of ongoing pain.

## 2020-08-11 NOTE — Assessment & Plan Note (Signed)
Chronic, ongoing, continue vaginal estrace and her testosterone cream which offer benefit.  Refills sent in and PDMP reviewed.

## 2020-08-11 NOTE — Patient Instructions (Signed)
Healthy Eating Following a healthy eating pattern may help you to achieve and maintain a healthy body weight, reduce the risk of chronic disease, and live a long and productive life. It is important to follow a healthy eating pattern at an appropriate calorie level for your body. Your nutritional needs should be met primarily through food by choosing a variety of nutrient-rich foods. What are tips for following this plan? Reading food labels  Read labels and choose the following: ? Reduced or low sodium. ? Juices with 100% fruit juice. ? Foods with low saturated fats and high polyunsaturated and monounsaturated fats. ? Foods with whole grains, such as whole wheat, cracked wheat, brown rice, and wild rice. ? Whole grains that are fortified with folic acid. This is recommended for women who are pregnant or who want to become pregnant.  Read labels and avoid the following: ? Foods with a lot of added sugars. These include foods that contain brown sugar, corn sweetener, corn syrup, dextrose, fructose, glucose, high-fructose corn syrup, honey, invert sugar, lactose, malt syrup, maltose, molasses, raw sugar, sucrose, trehalose, or turbinado sugar.  Do not eat more than the following amounts of added sugar per day:  6 teaspoons (25 g) for women.  9 teaspoons (38 g) for men. ? Foods that contain processed or refined starches and grains. ? Refined grain products, such as white flour, degermed cornmeal, white bread, and white rice. Shopping  Choose nutrient-rich snacks, such as vegetables, whole fruits, and nuts. Avoid high-calorie and high-sugar snacks, such as potato chips, fruit snacks, and candy.  Use oil-based dressings and spreads on foods instead of solid fats such as butter, stick margarine, or cream cheese.  Limit pre-made sauces, mixes, and "instant" products such as flavored rice, instant noodles, and ready-made pasta.  Try more plant-protein sources, such as tofu, tempeh, black beans,  edamame, lentils, nuts, and seeds.  Explore eating plans such as the Mediterranean diet or vegetarian diet. Cooking  Use oil to saut or stir-fry foods instead of solid fats such as butter, stick margarine, or lard.  Try baking, boiling, grilling, or broiling instead of frying.  Remove the fatty part of meats before cooking.  Steam vegetables in water or broth. Meal planning   At meals, imagine dividing your plate into fourths: ? One-half of your plate is fruits and vegetables. ? One-fourth of your plate is whole grains. ? One-fourth of your plate is protein, especially lean meats, poultry, eggs, tofu, beans, or nuts.  Include low-fat dairy as part of your daily diet. Lifestyle  Choose healthy options in all settings, including home, work, school, restaurants, or stores.  Prepare your food safely: ? Wash your hands after handling raw meats. ? Keep food preparation surfaces clean by regularly washing with hot, soapy water. ? Keep raw meats separate from ready-to-eat foods, such as fruits and vegetables. ? Cook seafood, meat, poultry, and eggs to the recommended internal temperature. ? Store foods at safe temperatures. In general:  Keep cold foods at 59F (4.4C) or below.  Keep hot foods at 159F (60C) or above.  Keep your freezer at [facility] (-17.8C) or below.  Foods are no longer safe to eat when they have been between the temperatures of 40-159F (4.4-60C) for more than 2 hours. What foods should I eat? Fruits Aim to eat 2 cup-equivalents of fresh, canned (in natural juice), or frozen fruits each day. Examples of 1 cup-equivalent of fruit include 1 small apple, 8 large strawberries, 1 cup canned fruit,  cup  dried fruit, or 1 cup 100% juice. Vegetables Aim to eat 2-3 cup-equivalents of fresh and frozen vegetables each day, including different varieties and colors. Examples of 1 cup-equivalent of vegetables include 2 medium carrots, 2 cups raw, leafy greens, 1 cup chopped  vegetable (raw or cooked), or 1 medium baked potato. Grains Aim to eat 6 ounce-equivalents of whole grains each day. Examples of 1 ounce-equivalent of grains include 1 slice of bread, 1 cup ready-to-eat cereal, 3 cups popcorn, or  cup cooked rice, pasta, or cereal. Meats and other proteins Aim to eat 5-6 ounce-equivalents of protein each day. Examples of 1 ounce-equivalent of protein include 1 egg, 1/2 cup nuts or seeds, or 1 tablespoon (16 g) peanut butter. A cut of meat or fish that is the size of a deck of cards is about 3-4 ounce-equivalents.  Of the protein you eat each week, try to have at least 8 ounces come from seafood. This includes salmon, trout, herring, and anchovies. Dairy Aim to eat 3 cup-equivalents of fat-free or low-fat dairy each day. Examples of 1 cup-equivalent of dairy include 1 cup (240 mL) milk, 8 ounces (250 g) yogurt, 1 ounces (44 g) natural cheese, or 1 cup (240 mL) fortified soy milk. Fats and oils  Aim for about 5 teaspoons (21 g) per day. Choose monounsaturated fats, such as canola and olive oils, avocados, peanut butter, and most nuts, or polyunsaturated fats, such as sunflower, corn, and soybean oils, walnuts, pine nuts, sesame seeds, sunflower seeds, and flaxseed. Beverages  Aim for six 8-oz glasses of water per day. Limit coffee to three to five 8-oz cups per day.  Limit caffeinated beverages that have added calories, such as soda and energy drinks.  Limit alcohol intake to no more than 1 drink a day for nonpregnant women and 2 drinks a day for men. One drink equals 12 oz of beer (355 mL), 5 oz of wine (148 mL), or 1 oz of hard liquor (44 mL). Seasoning and other foods  Avoid adding excess amounts of salt to your foods. Try flavoring foods with herbs and spices instead of salt.  Avoid adding sugar to foods.  Try using oil-based dressings, sauces, and spreads instead of solid fats. This information is based on general [place] nutrition guidelines. For more  information, visit [employer]. Exact amounts may vary based on your nutrition needs. Summary  A healthy eating plan may help you to maintain a healthy weight, reduce the risk of chronic diseases, and stay active throughout your life.  Plan your meals. Make sure you eat the right portions of a variety of nutrient-rich foods.  Try baking, boiling, grilling, or broiling instead of frying.  Choose healthy options in all settings, including home, work, school, restaurants, or stores. This information is not intended to replace advice given to you by your health care provider. Make sure you discuss any questions you have with your health care provider. Document Revised: 12/04/2017 Document Reviewed: 12/04/2017 [employer] Patient Education  [place].

## 2020-08-11 NOTE — Assessment & Plan Note (Signed)
Chronic, stable.  Continue current medication regimen and collaboration with dermatology at [facility].   

## 2020-08-11 NOTE — Assessment & Plan Note (Addendum)
Chronic, stable.  Continue daily Protonix and attempt reduction to discontinuation in future if tolerated.  Refills sent in.  Mag level annually.

## 2020-08-11 NOTE — Assessment & Plan Note (Signed)
Ongoing, stable.  Next Dexa due in 2025.  Will take holiday off Fosamax as recent scores showing improvement with more osteopenia vs osteoporosis.  Check Vit D level.  Plan on repeat DEXA as noted above.

## 2020-08-11 NOTE — Assessment & Plan Note (Signed)
Chronic, ongoing.  Continue collaboration with nephrology and Benazepril for kidney protection.  CMP today.

## 2020-08-11 NOTE — Progress Notes (Signed)
BP 135/75   Pulse (!) 54   Temp 98.1 F (36.7 C) (Oral)   Ht 5' 3.39" (1.61 m)   Wt 127 lb 12.5 oz (58 kg)   SpO2 100%   BMI 22.36 kg/m    Subjective:    Patient ID: Lindsey Blair, female    DOB: October 10, 1945, 74 y.o.   MRN: [id number]  HPI: Lindsey Blair is a 74 y.o. female presenting on 08/11/2020 for comprehensive medical examination. Current medical complaints include:none  She currently lives with: husband Menopausal Symptoms: no -- is on long term estradiol and testosterone gel  HYPERTENSION / HYPERLIPIDEMIA/CKD 3 Continues on Amlodipine, Benazepril, and Lipitor daily -- she reports stopping Lipitor as is trying without this and wants to see what levels are.  Followed by nephrology and last seen by Dr. Abigail Butts 03/10/20.   Satisfied with current treatment? yes Duration of hypertension: chronic BP monitoring frequency: daily BP range: 120-130/70-80 -- this morning 116/67 BP medication side effects: no Duration of hyperlipidemia: chronic Cholesterol medication side effects: no Cholesterol supplements: none Medication compliance: good compliance Aspirin: no Recent stressors: no Recurrent headaches: no Visual changes: no Palpitations: no Dyspnea: no Chest pain: no Lower extremity edema: no Dizzy/lightheaded: no  The 10-year ASCVD risk score Mikey Bussing DC Jr., et al., 2013) is: 22.9%   Values used to calculate the score:     Age: 66 years     Sex: Female     Is Non-Hispanic African American: No     Diabetic: No     Tobacco smoker: No     Systolic Blood Pressure: 893 mmHg     Is BP treated: Yes     HDL Cholesterol: 74 mg/dL     Total Cholesterol: 167 mg/dL  SYSTEMIC LUPUS ERYTHEMATOSUS: Is followed by Dr. Evorn Gong for this.  Last saw him 04/29/2020 -- continues on Plaquenil for this and interaction with Trazodone.  Has no concerns about this at this time.    GERD Continues on Protonix daily. GERD control status: stable  Satisfied with current treatment?  yes Heartburn frequency:  Medication side effects: no  Medication compliance: stable Dysphagia: no Odynophagia:  no Hematemesis: no Blood in stool: no EGD: no   BACK PAIN She reports this is ongoing since August on and off.  Saw urgent care on 05/05/20 == was given Prednisone for 5 days, Tramadol, Baclofen.  She did have imaging, but unable to see in Epic -- she reports they told her it was a muscle spasm. Duration: weeks Mechanism of injury: lifting -- picking stuff in garden Location: lower right side Onset: sudden Severity: 10/10 Quality: sharp and spasm -- dull aching Frequency: intermittent Radiation: none Aggravating factors: lifting, movement, bending, coughing, valsalva and Pain increased with coughing/valsalva Alleviating factors: rest, ice, heat, APAP and muscle relaxer Status: fluctuating Treatments attempted: APAP  Relief with NSAIDs?: No NSAIDs Taken Nighttime pain:  no Paresthesias / decreased sensation:  no Bowel / bladder incontinence:  no Fevers:  no Dysuria / urinary frequency:  no   OSTEOPOROSIS History of noted on scan, but recent scan in February 2020 noted osteopenia, improved scores.  She recently stopped taking Fosamax due to constipation and notes improvement without this on board.  Educated her on holiday from medication due to improved scores. Satisfied with current treatment?: yes - no medication at this time Medication side effects: constipation Medication compliance: stopped taking Past osteoporosis medications/treatments:  Adequate calcium & vitamin D: yes Intolerance to bisphosphonates:no Weight bearing exercises: yes  DEPRESSION Continues on Trazodone, which benefits her insomnia and mood, plus Wellbutrin daily.  At this time has to stop Trazodone due to interaction with Plaquenil -- so has been taking 3 Melatonin.  Has taken Ambien in past which offered benefit. Mood status: stable Satisfied with current treatment?: yes Symptom severity:  mild  Duration of current treatment : chronic Side effects: no Medication compliance: good compliance Psychotherapy/counseling: none Depressed mood: no Anxious mood: no Anhedonia: no Significant weight loss or gain: no Insomnia: none Fatigue: no Feelings of worthlessness or guilt: no Impaired concentration/indecisiveness: no Suicidal ideations: no Hopelessness: no Crying spells: no Depression screen [facility] 2/9 08/11/2020 08/10/2020 02/10/2020 08/12/2019 11/01/2018  Decreased Interest 0 0 0 0 0  Down, Depressed, Hopeless 0 0 0 0 0  PHQ - 2 Score 0 0 0 0 0  Altered sleeping 1 1 1 1  -  Tired, decreased energy 0 0 0 0 -  Change in appetite 0 0 0 0 -  Feeling bad or failure about yourself  0 0 0 0 -  Trouble concentrating 0 0 1 0 -  Moving slowly or fidgety/restless 0 0 0 0 -  Suicidal thoughts 0 0 0 0 -  PHQ-9 Score 1 1 2 1  -  Difficult doing work/chores - Not difficult at all Not difficult at all Not difficult at all -   Depression Screen done today and results listed below:  Depression screen [facility] 2/9 08/11/2020 08/10/2020 02/10/2020 08/12/2019 11/01/2018  Decreased Interest 0 0 0 0 0  Down, Depressed, Hopeless 0 0 0 0 0  PHQ - 2 Score 0 0 0 0 0  Altered sleeping 1 1 1 1  -  Tired, decreased energy 0 0 0 0 -  Change in appetite 0 0 0 0 -  Feeling bad or failure about yourself  0 0 0 0 -  Trouble concentrating 0 0 1 0 -  Moving slowly or fidgety/restless 0 0 0 0 -  Suicidal thoughts 0 0 0 0 -  PHQ-9 Score 1 1 2 1  -  Difficult doing work/chores - Not difficult at all Not difficult at all Not difficult at all -    The patient does not have a history of falls. I did not complete a risk assessment for falls. A plan of care for falls was not documented.   Past Medical History:  Past Medical History:  Diagnosis Date  . Anxiety   . Cancer [facility]) Several instances of the years   Skin  . Chronic kidney disease   . Cutaneous lupus erythematosus   . Depression   . Hyperlipidemia   .  Hypertension   . Vertigo    rare    Surgical History:  Past Surgical History:  Procedure Laterality Date  . BREAST BIOPSY Right 2000   Negative  . CATARACT EXTRACTION Right 08/05/2019  . COLONOSCOPY WITH PROPOFOL N/A 10/22/2015   Procedure: COLONOSCOPY WITH PROPOFOL;  Surgeon: Lucilla Lame, MD;  Location: [place];  Service: Endoscopy;  Laterality: N/A;  . ESOPHAGOGASTRODUODENOSCOPY (EGD) WITH PROPOFOL N/A 10/22/2015   Procedure: ESOPHAGOGASTRODUODENOSCOPY (EGD) WITH PROPOFOL;  Surgeon: Lucilla Lame, MD;  Location: [place];  Service: Endoscopy;  Laterality: N/A;  . IMAGE GUIDED SINUS SURGERY  04/06/11   [facility], Dr. Pryor Ochoa  . MASTOIDECTOMY      Medications:  Current Outpatient Medications on File Prior to Visit  Medication Sig  . estradiol (ESTRACE VAGINAL) 0.1 MG/GM vaginal cream Place 1 Applicatorful vaginally at bedtime. Place pea-sized amount  vaginally at bedtime.  . hydroxychloroquine (PLAQUENIL) 200 MG tablet Take 200 mg by mouth daily.  Marland Kitchen MELATONIN PO Take 1 Dose by mouth at bedtime.  . Multiple Vitamin (MULTI-VITAMIN DAILY PO) Take by mouth.  Marland Kitchen atorvastatin (LIPITOR) 20 MG tablet Take 1 tablet (20 mg total) by mouth daily. (Patient not taking: Reported on 08/10/2020)   No current facility-administered medications on file prior to visit.    Allergies:  Allergies  Allergen Reactions  . Penicillins Rash    Social History:  Social History   Socioeconomic History  . Marital status: Married    Spouse name: Not on file  . Number of children: Not on file  . Years of education: Not on file  . Highest education level: Some college, no degree  Occupational History  . Occupation: retired   Tobacco Use  . Smoking status: Former Smoker    Types: Cigarettes    Quit date: 03/08/1965    Years since quitting: 55.4  . Smokeless tobacco: Never Used  Vaping Use  . Vaping Use: Never used  Substance and Sexual Activity  . Alcohol use: Yes    Alcohol/week: 5.0  standard drinks    Types: 2 Glasses of wine, 3 Shots of liquor per week  . Drug use: No  . Sexual activity: Not on file  Other Topics Concern  . Not on file  Social History Narrative  . Not on file   Social Determinants of Health   Financial Resource Strain: Low Risk   . Difficulty of Paying Living Expenses: Not hard at all  Food Insecurity: No Food Insecurity  . Worried About Charity fundraiser in the Last Year: Never true  . Ran Out of Food in the Last Year: Never true  Transportation Needs: No Transportation Needs  . Lack of Transportation (Medical): No  . Lack of Transportation (Non-Medical): No  Physical Activity: Inactive  . Days of Exercise per Week: 0 days  . Minutes of Exercise per Session: 0 min  Stress: No Stress Concern Present  . Feeling of Stress : Not at all  Social Connections:   . Frequency of Communication with Friends and Family: Not on file  . Frequency of Social Gatherings with Friends and Family: Not on file  . Attends Religious Services: Not on file  . Active Member of Clubs or Organizations: Not on file  . Attends Archivist Meetings: Not on file  . Marital Status: Not on file  Intimate Partner Violence:   . Fear of Current or Ex-Partner: Not on file  . Emotionally Abused: Not on file  . Physically Abused: Not on file  . Sexually Abused: Not on file   Social History   Tobacco Use  Smoking Status Former Smoker  . Types: Cigarettes  . Quit date: 03/08/1965  . Years since quitting: 55.4  Smokeless Tobacco Never Used   Social History   Substance and Sexual Activity  Alcohol Use Yes  . Alcohol/week: 5.0 standard drinks  . Types: 2 Glasses of wine, 3 Shots of liquor per week    Family History:  Family History  Problem Relation Age of Onset  . Heart disease Mother   . Leukemia Father   . Stroke Father   . Cerebral aneurysm Sister   . Prostate cancer Brother   . Prostate cancer Brother   . Prostate cancer Brother   . Lung cancer  Sister   . Bladder Cancer Sister   . Liver cancer Sister   .  Brain cancer Sister   . Heart attack Sister   . Breast cancer Neg Hx     Past medical history, surgical history, medications, allergies, family history and social history reviewed with patient today and changes made to appropriate areas of the chart.   Review of Systems - negative All other ROS negative except what is listed above and in the HPI.      Objective:    BP 135/75   Pulse (!) 54   Temp 98.1 F (36.7 C) (Oral)   Ht 5' 3.39" (1.61 m)   Wt 127 lb 12.5 oz (58 kg)   SpO2 100%   BMI 22.36 kg/m   Wt Readings from Last 3 Encounters:  08/11/20 127 lb 12.5 oz (58 kg)  08/10/20 122 lb (55.3 kg)  02/10/20 124 lb 12.8 oz (56.6 kg)    Physical Exam Constitutional:      General: She is awake. She is not in acute distress.    Appearance: She is well-developed. She is not ill-appearing.  HENT:     Head: Normocephalic and atraumatic.     Right Ear: Hearing, tympanic membrane, ear canal and external ear normal. No drainage.     Left Ear: Hearing, tympanic membrane, ear canal and external ear normal. No drainage.     Nose: Nose normal.     Right Sinus: No maxillary sinus tenderness or frontal sinus tenderness.     Left Sinus: No maxillary sinus tenderness or frontal sinus tenderness.     Mouth/Throat:     Mouth: Mucous membranes are moist.     Pharynx: Oropharynx is clear. Uvula midline. No pharyngeal swelling, oropharyngeal exudate or posterior oropharyngeal erythema.  Eyes:     General: Lids are normal.        Right eye: No discharge.        Left eye: No discharge.     Extraocular Movements: Extraocular movements intact.     Conjunctiva/sclera: Conjunctivae normal.     Pupils: Pupils are equal, round, and reactive to light.     Visual Fields: Right eye visual fields normal and left eye visual fields normal.  Neck:     Thyroid: No thyromegaly.     Vascular: No carotid bruit.     Trachea: Trachea normal.   Cardiovascular:     Rate and Rhythm: Normal rate and regular rhythm.     Heart sounds: Normal heart sounds. No murmur heard.  No gallop.   Pulmonary:     Effort: Pulmonary effort is normal. No accessory muscle usage or respiratory distress.     Breath sounds: Normal breath sounds.  Chest:     Comments: Deferred per patient request today Abdominal:     General: Bowel sounds are normal.     Palpations: Abdomen is soft. There is no hepatomegaly or splenomegaly.     Tenderness: There is no abdominal tenderness.  Musculoskeletal:        General: Normal range of motion.     Cervical back: Normal range of motion and neck supple.     Lumbar back: Spasms and tenderness (right lower) present. No swelling, edema or bony tenderness. Normal range of motion. No scoliosis.     Right lower leg: No edema.     Left lower leg: No edema.  Lymphadenopathy:     Head:     Right side of head: No submental, submandibular, tonsillar, preauricular or posterior auricular adenopathy.     Left side of head: No submental, submandibular, tonsillar, preauricular  or posterior auricular adenopathy.     Cervical: No cervical adenopathy.  Skin:    General: Skin is warm and dry.     Capillary Refill: Capillary refill takes less than 2 seconds.     Findings: No rash.  Neurological:     Mental Status: She is alert and oriented to person, place, and time.     Cranial Nerves: Cranial nerves are intact.     Gait: Gait is intact.     Deep Tendon Reflexes: Reflexes are normal and symmetric.     Reflex Scores:      Brachioradialis reflexes are 2+ on the right side and 2+ on the left side.      Patellar reflexes are 2+ on the right side and 2+ on the left side. Psychiatric:        Attention and Perception: Attention normal.        Mood and Affect: Mood normal.        Speech: Speech normal.        Behavior: Behavior normal. Behavior is cooperative.        Thought Content: Thought content normal.        Judgment:  Judgment normal.     Results for orders placed or performed in visit on 27/25/36  Basic metabolic panel  Result Value Ref Range   Glucose 89 65 - 99 mg/dL   BUN 15 8 - 27 mg/dL   Creatinine, Ser 1.03 (H) 0.57 - 1.00 mg/dL   GFR calc non Af Amer 54 (L) >59 mL/min/1.73   GFR calc Af Amer 62 >59 mL/min/1.73   BUN/Creatinine Ratio 15 12 - 28   Sodium 143 134 - 144 mmol/L   Potassium 4.4 3.5 - 5.2 mmol/L   Chloride 104 96 - 106 mmol/L   CO2 22 20 - 29 mmol/L   Calcium 9.8 8.7 - 10.3 mg/dL  Lipid Panel w/o Chol/HDL Ratio  Result Value Ref Range   Cholesterol, Total 167 100 - 199 mg/dL   Triglycerides 61 0 - 149 mg/dL   HDL 74 >39 mg/dL   VLDL Cholesterol Cal 12 5 - 40 mg/dL   LDL Chol Calc (NIH) 81 0 - 99 mg/dL      Assessment & Plan:   Problem List Items Addressed This Visit      Cardiovascular and Mediastinum   Hypertension    Chronic, stable with BP at goal in office and home readings at goal.  Continue combo pill for Benazepril-Amlodipine and adjust as needed.  Continue current medication regimen and checking BP at home + focus on DASH diet.  CMP and TSH today.  Return in 6 months for follow-up to meet new PCP.      Relevant Medications   amLODipine-benazepril (LOTREL) 10-20 MG capsule   Other Relevant Orders   TSH   Microalbumin, Urine Waived     Digestive   Reflux esophagitis    Chronic, stable.  Continue daily Protonix and attempt reduction to discontinuation in future if tolerated.  Refills sent in.  Mag level annually.      Relevant Medications   pantoprazole (PROTONIX) 40 MG tablet   Other Relevant Orders   Magnesium     Musculoskeletal and Integument   Osteopenia    Ongoing, stable.  Next Dexa due in 2025.  Will take holiday off Fosamax as recent scores showing improvement with more osteopenia vs osteoporosis.  Check Vit D level.  Plan on repeat DEXA as noted above.  Relevant Orders   VITAMIN D 25 Hydroxy (Vit-D Deficiency, Fractures)      Genitourinary   Atrophic vaginitis    Chronic, ongoing, continue vaginal estrace and her testosterone cream which offer benefit.  Refills sent in and PDMP reviewed.      CKD (chronic kidney disease) stage 3, GFR 30-59 ml/min (HCC) - Primary    Chronic, ongoing.  Continue collaboration with nephrology and Benazepril for kidney protection.  CMP today.      Relevant Orders   Comprehensive metabolic panel     Other   Hyperlipidemia    Chronic, ongoing.  Continue current medication regimen and adjust as needed -- at this time she is taking break from it, if levels are stable without medication could trial without.  Lipid panel today.      Relevant Medications   amLODipine-benazepril (LOTREL) 10-20 MG capsule   Other Relevant Orders   Comprehensive metabolic panel   Lipid Panel w/o Chol/HDL Ratio   Depression    Chronic, stable.  Denies SI/HI.  Continue current medication regimen and adjust as needed. Refills sent in.  Return in 6 months for follow-up to meet new PCP.      Relevant Medications   buPROPion (WELLBUTRIN SR) 150 MG 12 hr tablet   Insomnia    Chronic, ongoing with poor control with Melatonin.  Can not take Trazodone due to interaction with Plaquenil, QT prolongation, and similar is with Seroquel.  Has taken Ambien in past with benefit and this appears not to interact.  Discussed at length with her risk of this medication in age over 45, but she wishes to use this at lowest dose and is aware can not go above 5 MG.  Will trial this.  Obtain UDS today and contract next visit, discussed with her controlled substance office rules.  Script sent.  PDMP reviewed.  Return in 6 months for follow up.        Relevant Orders   [id number] 11+Oxyco+Alc+Crt-Bund   Systemic lupus erythematosus (HCC)    Chronic, stable.  Continue current medication regimen and collaboration with dermatology at [facility].        Relevant Orders   CBC with Differential/Platelet   Comprehensive metabolic panel   Back  pain    Waxes and wanes, current flare.  Will send in script for Tizanidine and Prednisone taper, suspect musculoskeletal as no red flags on exam.  Recommend use of Tylenol at home as needed + alternating ice and heat.  Use of OTC Voltaren and Icy/Hot gel as needed.  Return to office for worsening of ongoing pain.      Relevant Medications   predniSONE (DELTASONE) 10 MG tablet   tiZANidine (ZANAFLEX) 4 MG tablet    Other Visit Diagnoses    Encounter for annual physical exam       Annual labs today to include CBC, CMP, TSH, lipid       Follow up plan: Return in about 6 months (around 02/09/2021) for HTN/HLD, Lupus, MOOD, Insomnia -- meet new PCP.   LABORATORY TESTING:  - Pap smear: not applicable  IMMUNIZATIONS:   - Tdap: Tetanus vaccination status reviewed: last tetanus booster within 10 years. - Influenza: Up to date - Pneumovax: Up to date - Prevnar: Up to date - HPV: Not applicable - Zostavax vaccine: Up to date  SCREENING: -Mammogram: Up to date July 2021 - Colonoscopy: Up to date  - Bone Density: Up to date  Done in 2020 -Hearing Test: Not applicable  -Spirometry:  Not applicable   PATIENT COUNSELING:   Advised to take 1 mg of folate supplement per day if capable of pregnancy.   Sexuality: Discussed sexually transmitted diseases, partner selection, use of condoms, avoidance of unintended pregnancy  and contraceptive alternatives.   Advised to avoid cigarette smoking.  I discussed with the patient that most people either abstain from alcohol or drink within safe limits (<=14/week and <=4 drinks/occasion for males, <=7/weeks and <= 3 drinks/occasion for females) and that the risk for alcohol disorders and other health effects rises proportionally with the number of drinks per week and how often a drinker exceeds daily limits.  Discussed cessation/primary prevention of drug use and availability of treatment for abuse.   Diet: Encouraged to adjust caloric intake to  maintain  or achieve ideal body weight, to reduce intake of dietary saturated fat and total fat, to limit sodium intake by avoiding high sodium foods and not adding table salt, and to maintain adequate dietary potassium and calcium preferably from fresh fruits, vegetables, and low-fat dairy products.    Stressed the importance of regular exercise  Injury prevention: Discussed safety belts, safety helmets, smoke detector, smoking near bedding or upholstery.   Dental health: Discussed importance of regular tooth brushing, flossing, and dental visits.    NEXT PREVENTATIVE PHYSICAL DUE IN 1 YEAR. Return in about 6 months (around 02/09/2021) for HTN/HLD, Lupus, MOOD, Insomnia -- meet new PCP.

## 2020-08-11 NOTE — Assessment & Plan Note (Signed)
Chronic, ongoing with poor control with Melatonin.  Can not take Trazodone due to interaction with Plaquenil, QT prolongation, and similar is with Seroquel.  Has taken Ambien in past with benefit and this appears not to interact.  Discussed at length with her risk of this medication in age over 62, but she wishes to use this at lowest dose and is aware can not go above 5 MG.  Will trial this.  Obtain UDS today and contract next visit, discussed with her controlled substance office rules.  Script sent.  PDMP reviewed.  Return in 6 months for follow up.

## 2020-08-11 NOTE — Assessment & Plan Note (Signed)
Chronic, ongoing.  Continue current medication regimen and adjust as needed -- at this time she is taking break from it, if levels are stable without medication could trial without.  Lipid panel today.

## 2020-08-12 ENCOUNTER — Other Ambulatory Visit: Payer: Self-pay | Admitting: Nurse Practitioner

## 2020-08-12 DIAGNOSIS — E7800 Pure hypercholesterolemia, unspecified: Secondary | ICD-10-CM

## 2020-08-12 LAB — COMPREHENSIVE METABOLIC PANEL
ALT: 20 IU/L (ref 0–32)
AST: 31 IU/L (ref 0–40)
Albumin/Globulin Ratio: 2.1 (ref 1.2–2.2)
Albumin: 4.6 g/dL (ref 3.7–4.7)
Alkaline Phosphatase: 58 IU/L (ref 44–121)
BUN/Creatinine Ratio: 10 — ABNORMAL LOW (ref 12–28)
BUN: 11 mg/dL (ref 8–27)
Bilirubin Total: 0.4 mg/dL (ref 0.0–1.2)
CO2: 23 mmol/L (ref 20–29)
Calcium: 9.6 mg/dL (ref 8.7–10.3)
Chloride: 102 mmol/L (ref 96–106)
Creatinine, Ser: 1.06 mg/dL — ABNORMAL HIGH (ref 0.57–1.00)
GFR calc Af Amer: 59 mL/min/{1.73_m2} — ABNORMAL LOW (ref >59)
GFR calc non Af Amer: 51 mL/min/{1.73_m2} — ABNORMAL LOW (ref >59)
Globulin, Total: 2.2 g/dL (ref 1.5–4.5)
Glucose: 90 mg/dL (ref 65–99)
Potassium: 4.2 mmol/L (ref 3.5–5.2)
Sodium: 138 mmol/L (ref 134–144)
Total Protein: 6.8 g/dL (ref 6.0–8.5)

## 2020-08-12 LAB — MAGNESIUM: Magnesium: 2.1 mg/dL (ref 1.6–2.3)

## 2020-08-12 LAB — CBC WITH DIFFERENTIAL/PLATELET
Basophils Absolute: 0 10*3/uL (ref 0.0–0.2)
Basos: 1 %
EOS (ABSOLUTE): 0.1 10*3/uL (ref 0.0–0.4)
Eos: 2 %
Hematocrit: 42.6 % (ref 34.0–46.6)
Hemoglobin: 13.3 g/dL (ref 11.1–15.9)
Immature Grans (Abs): 0 10*3/uL (ref 0.0–0.1)
Immature Granulocytes: 0 %
Lymphocytes Absolute: 1.3 10*3/uL (ref 0.7–3.1)
Lymphs: 31 %
MCH: 24.7 pg — ABNORMAL LOW (ref 26.6–33.0)
MCHC: 31.2 g/dL — ABNORMAL LOW (ref 31.5–35.7)
MCV: 79 fL (ref 79–97)
Monocytes Absolute: 0.4 10*3/uL (ref 0.1–0.9)
Monocytes: 10 %
Neutrophils Absolute: 2.4 10*3/uL (ref 1.4–7.0)
Neutrophils: 56 %
Platelets: 216 10*3/uL (ref 150–450)
RBC: 5.38 x10E6/uL — ABNORMAL HIGH (ref 3.77–5.28)
RDW: 13 % (ref 11.7–15.4)
WBC: 4.3 10*3/uL (ref 3.4–10.8)

## 2020-08-12 LAB — VITAMIN D 25 HYDROXY (VIT D DEFICIENCY, FRACTURES): Vit D, 25-Hydroxy: 38.3 ng/mL (ref 30.0–100.0)

## 2020-08-12 LAB — LIPID PANEL W/O CHOL/HDL RATIO
Cholesterol, Total: 271 mg/dL — ABNORMAL HIGH (ref 100–199)
HDL: 74 mg/dL (ref >39)
LDL Chol Calc (NIH): 180 mg/dL — ABNORMAL HIGH (ref 0–99)
Triglycerides: 101 mg/dL (ref 0–149)
VLDL Cholesterol Cal: 17 mg/dL (ref 5–40)

## 2020-08-12 LAB — TSH: TSH: 1.19 u[IU]/mL (ref 0.450–4.500)

## 2020-08-12 MED ORDER — ATORVASTATIN CALCIUM 20 MG PO TABS: 20.0000 mg | ORAL_TABLET | Freq: Every day | ORAL | 4 refills | Status: DC

## 2020-08-12 NOTE — Progress Notes (Signed)
Good morning, please let Lindsey Blair know her labs have returned and overall are stable.  Kidney function continues to show mild kidney disease with no decline, will continue to monitor this closely at visits.  Her cholesterol levels have increase quite a bit without her medication on board, her LDL went from 81 to 180.  I recommend she restart her Atorvastatin, I have sent in refills, to help prevent stroke.  If any questions please let me know.  I hope she had a wonderful day!! Keep being awesome!!  Thank you for allowing me to participate in your care. Kindest regards, Clarinda Obi

## 2020-08-17 ENCOUNTER — Other Ambulatory Visit: Payer: Self-pay | Admitting: Nurse Practitioner

## 2020-08-17 LAB — DRUG SCREEN 764883 11+OXYCO+ALC+CRT-BUND
Amphetamines, Urine: NEGATIVE ng/mL
BENZODIAZ UR QL: NEGATIVE ng/mL
Barbiturate: NEGATIVE ng/mL
Cannabinoid Quant, Ur: NEGATIVE ng/mL
Cocaine (Metabolite): NEGATIVE ng/mL
Creatinine: 76.6 mg/dL (ref 20.0–300.0)
Ethanol: NEGATIVE %
Meperidine: NEGATIVE ng/mL
Methadone Screen, Urine: NEGATIVE ng/mL
OPIATE SCREEN URINE: NEGATIVE ng/mL
Oxycodone/Oxymorphone, Urine: NEGATIVE ng/mL
Phencyclidine: NEGATIVE ng/mL
Propoxyphene: NEGATIVE ng/mL
pH, Urine: 7.6 (ref 4.5–8.9)

## 2020-08-17 LAB — TRAMADOL GC/MS, URINE: Tramadol: NEGATIVE

## 2020-08-17 NOTE — Telephone Encounter (Signed)
RX verbally called in to pharmacy.

## 2020-08-17 NOTE — Telephone Encounter (Signed)
Pt called in states that the pharmacy never received rx. Reached out to Lindsey Blair she is going to call the pharmacy

## 2020-08-17 NOTE — Telephone Encounter (Signed)
Requested medication (s) are due for refill today: no  Requested medication (s) are on the active medication list: yes  Last refill:  6 days ago  Future visit scheduled: yes  Notes to clinic: To pharmacy: PLEASE UPDATE REFILLS SO WE CAN COMPOUND AND MAIL TO [facility] [place]    Requested Prescriptions  Pending Prescriptions Disp Refills   Testosterone 1.62 % GEL [Pharmacy Med Name: TESTOSTERONE 4% GEL] 15 g     Sig: APPLY ONTO THE SKIN 3 TIMES A WEEK AS DIRECTED      Off-Protocol Failed - 08/17/2020 10:01 AM      Failed - Medication not assigned to a protocol, review manually.      Passed - Valid encounter within last 12 months    Recent Outpatient Visits           6 days ago Stage 3a chronic kidney disease ([place])   [place] Cannady, Jolene T, NP   6 months ago Recurrent major depressive disorder, in full remission ([place])   [place], Henrine Screws T, NP   10 months ago Vaginal dryness, menopausal   [facility] Eulogio Bear, NP   1 year ago Annual physical exam   [place], Barbaraann Faster, NP   1 year ago Essential hypertension   [place], MD       Future Appointments             In 5 months Cannady, Barbaraann Faster, NP [facility]   In 91 months  [facility]

## 2020-08-19 ENCOUNTER — Telehealth: Payer: Self-pay

## 2020-08-19 NOTE — Telephone Encounter (Signed)
Testosterone called in.

## 2020-08-19 NOTE — Telephone Encounter (Signed)
Copied from [place] [id number]. Topic: General - Inquiry >> Aug 19, 2020 12:39 PM Gillis Ends D wrote: Reason for CRM: [facility] in [place] is requesting a call back for some clarity on a prescription. She can be reached at [phone] and her name is Seth Bake. Please advise

## 2020-11-03 DIAGNOSIS — C44311 Basal cell carcinoma of skin of nose: Secondary | ICD-10-CM | POA: Diagnosis not present

## 2020-11-03 DIAGNOSIS — D2271 Melanocytic nevi of right lower limb, including hip: Secondary | ICD-10-CM | POA: Diagnosis not present

## 2020-11-03 DIAGNOSIS — D2261 Melanocytic nevi of right upper limb, including shoulder: Secondary | ICD-10-CM | POA: Diagnosis not present

## 2020-11-03 DIAGNOSIS — L931 Subacute cutaneous lupus erythematosus: Secondary | ICD-10-CM | POA: Diagnosis not present

## 2020-11-03 DIAGNOSIS — D225 Melanocytic nevi of trunk: Secondary | ICD-10-CM | POA: Diagnosis not present

## 2020-11-03 DIAGNOSIS — Z85828 Personal history of other malignant neoplasm of skin: Secondary | ICD-10-CM | POA: Diagnosis not present

## 2020-11-03 DIAGNOSIS — D485 Neoplasm of uncertain behavior of skin: Secondary | ICD-10-CM | POA: Diagnosis not present

## 2020-11-03 DIAGNOSIS — L905 Scar conditions and fibrosis of skin: Secondary | ICD-10-CM | POA: Diagnosis not present

## 2020-11-03 DIAGNOSIS — M71342 Other bursal cyst, left hand: Secondary | ICD-10-CM | POA: Diagnosis not present

## 2020-11-03 DIAGNOSIS — L570 Actinic keratosis: Secondary | ICD-10-CM | POA: Diagnosis not present

## 2020-11-03 DIAGNOSIS — X32XXXA Exposure to sunlight, initial encounter: Secondary | ICD-10-CM | POA: Diagnosis not present

## 2020-11-24 ENCOUNTER — Other Ambulatory Visit: Payer: Self-pay | Admitting: Nurse Practitioner

## 2020-11-24 NOTE — Telephone Encounter (Signed)
Requested medication (s) are due for refill today: no  Requested medication (s) are on the active medication list: yes  Last refill:  11/24/2020  Future visit scheduled: yes  Notes to clinic: this refill cannot be delegated    Requested Prescriptions  Pending Prescriptions Disp Refills   zolpidem (AMBIEN) 5 MG tablet [Pharmacy Med Name: ZOLPIDEM TARTRATE 5 MG TABLET] 30 tablet 0    Sig: Take 1 tablet (5 mg total) by mouth at bedtime as needed for sleep.      Not Delegated - Psychiatry:  Anxiolytics/Hypnotics Failed - 11/24/2020 10:47 AM      Failed - This refill cannot be delegated      Passed - Urine Drug Screen completed in last 360 days      Passed - Valid encounter within last 6 months    Recent Outpatient Visits           3 months ago Stage 3a chronic kidney disease ([place])   [place] Cannady, Jolene T, NP   9 months ago Recurrent major depressive disorder, in full remission ([place])   [place], Henrine Screws T, NP   1 year ago Vaginal dryness, menopausal   [facility] Eulogio Bear, NP   1 year ago Annual physical exam   [place], Barbaraann Faster, NP   1 year ago Essential hypertension   [place], MD       Future Appointments             In 2 months Cannady, Barbaraann Faster, NP [facility]   In 8 months  [facility]

## 2020-12-04 DIAGNOSIS — M79645 Pain in left finger(s): Secondary | ICD-10-CM | POA: Diagnosis not present

## 2020-12-04 DIAGNOSIS — M67442 Ganglion, left hand: Secondary | ICD-10-CM | POA: Diagnosis not present

## 2020-12-17 DIAGNOSIS — C4401 Basal cell carcinoma of skin of lip: Secondary | ICD-10-CM | POA: Diagnosis not present

## 2020-12-30 ENCOUNTER — Other Ambulatory Visit: Payer: Self-pay | Admitting: Nurse Practitioner

## 2020-12-30 NOTE — Telephone Encounter (Signed)
Routing to provider  

## 2020-12-30 NOTE — Telephone Encounter (Signed)
Scheduled 6/6.

## 2020-12-30 NOTE — Telephone Encounter (Signed)
Requested medication (s) are due for refill today: yes  Requested medication (s) are on the active medication list: ywa  Last refill: 11/24/20  Future visit scheduled: yes  Notes to clinic:  not delegated    Requested Prescriptions  Pending Prescriptions Disp Refills   zolpidem (AMBIEN) 5 MG tablet [Pharmacy Med Name: ZOLPIDEM TARTRATE 5 MG TABLET] 30 tablet 0    Sig: Take 1 tablet (5 mg total) by mouth at bedtime as needed for sleep.      Not Delegated - Psychiatry:  Anxiolytics/Hypnotics Failed - 12/30/2020  9:18 AM      Failed - This refill cannot be delegated      Passed - Urine Drug Screen completed in last 360 days      Passed - Valid encounter within last 6 months    Recent Outpatient Visits           4 months ago Stage 3a chronic kidney disease ([place])   [place] Cannady, Jolene T, NP   10 months ago Recurrent major depressive disorder, in full remission ([place])   [place], Henrine Screws T, NP   1 year ago Vaginal dryness, menopausal   [facility] Eulogio Bear, NP   1 year ago Annual physical exam   [place], Barbaraann Faster, NP   1 year ago Essential hypertension   [place], MD       Future Appointments             In 1 month Cannady, Barbaraann Faster, NP [facility]   In 7 months  [facility]

## 2021-01-26 ENCOUNTER — Other Ambulatory Visit: Payer: Self-pay | Admitting: Nurse Practitioner

## 2021-01-26 DIAGNOSIS — Z1231 Encounter for screening mammogram for malignant neoplasm of breast: Secondary | ICD-10-CM

## 2021-02-08 ENCOUNTER — Ambulatory Visit (INDEPENDENT_AMBULATORY_CARE_PROVIDER_SITE_OTHER): Payer: PPO | Admitting: Nurse Practitioner

## 2021-02-08 ENCOUNTER — Telehealth: Payer: Self-pay

## 2021-02-08 ENCOUNTER — Encounter: Payer: Self-pay | Admitting: Nurse Practitioner

## 2021-02-08 ENCOUNTER — Other Ambulatory Visit: Payer: Self-pay

## 2021-02-08 VITALS — BP 137/73 | HR 58 | Temp 97.5°F | Ht 62.5 in | Wt 125.8 lb

## 2021-02-08 DIAGNOSIS — I10 Essential (primary) hypertension: Secondary | ICD-10-CM | POA: Diagnosis not present

## 2021-02-08 DIAGNOSIS — F3342 Major depressive disorder, recurrent, in full remission: Secondary | ICD-10-CM | POA: Diagnosis not present

## 2021-02-08 DIAGNOSIS — F5104 Psychophysiologic insomnia: Secondary | ICD-10-CM

## 2021-02-08 DIAGNOSIS — M545 Low back pain, unspecified: Secondary | ICD-10-CM

## 2021-02-08 DIAGNOSIS — N1831 Chronic kidney disease, stage 3a: Secondary | ICD-10-CM

## 2021-02-08 DIAGNOSIS — M85852 Other specified disorders of bone density and structure, left thigh: Secondary | ICD-10-CM

## 2021-02-08 DIAGNOSIS — G8929 Other chronic pain: Secondary | ICD-10-CM | POA: Diagnosis not present

## 2021-02-08 DIAGNOSIS — E7800 Pure hypercholesterolemia, unspecified: Secondary | ICD-10-CM

## 2021-02-08 DIAGNOSIS — K2100 Gastro-esophageal reflux disease with esophagitis, without bleeding: Secondary | ICD-10-CM

## 2021-02-08 DIAGNOSIS — M85851 Other specified disorders of bone density and structure, right thigh: Secondary | ICD-10-CM

## 2021-02-08 DIAGNOSIS — N952 Postmenopausal atrophic vaginitis: Secondary | ICD-10-CM | POA: Diagnosis not present

## 2021-02-08 DIAGNOSIS — M329 Systemic lupus erythematosus, unspecified: Secondary | ICD-10-CM | POA: Diagnosis not present

## 2021-02-08 MED ORDER — TRIAMCINOLONE ACETONIDE 0.1 % EX CREA
1.0000 | TOPICAL_CREAM | Freq: Two times a day (BID) | CUTANEOUS | 0 refills | Status: DC
Start: 2021-02-08 — End: 2021-11-17

## 2021-02-08 MED ORDER — TESTOSTERONE 1.62 % TD GEL: 1.0000 "application " | TRANSDERMAL | 2 refills | Status: DC

## 2021-02-08 NOTE — Telephone Encounter (Signed)
Copied from [place] [id number]. Topic: Quick Communication - Rx Refill/Question >> Feb 08, 2021  3:46 PM Loma [place] wrote: Medication: Testosterone 1.62 % GEL 15 g 2 02/08/2021   Sig - Route: Place 1 application onto the skin 3 (three) times a week. I am unable to make the computer do a 4% gel but please use - Transdermal  Class: Print    Pharmacy [facility] calling due to compounding of drug specfic, Call Seth Bake at [facility] [phone]      Has the patient contacted their pharmacy? (Agent: If no, request that the patient contact the pharmacy for the refill.) (Agent: If yes, when and what did the pharmacy advise?)  Preferred Pharmacy (with phone number or street name): [place], [place] - [address] [place] [address] Phone: [phone] Fax: [phone]    Agent: Please be advised that RX refills may take up to 3 business days. We ask that you follow-up with your pharmacy.

## 2021-02-08 NOTE — Assessment & Plan Note (Signed)
Chronic, stable.  Continue daily Protonix and attempt reduction to discontinuation in future if tolerated.  Refills sent in.  Mag level annually.

## 2021-02-08 NOTE — Progress Notes (Signed)
BP 137/73 (BP Location: Left Arm, Cuff Size: Normal)   Pulse (!) 58   Temp (!) 97.5 F (36.4 C) (Oral)   Ht 5' 2.5" (1.588 m)   Wt 125 lb 12.8 oz (57.1 kg)   SpO2 98%   BMI 22.64 kg/m    Subjective:    Patient ID: Lindsey Blair, female    DOB: 03/19/1946, 75 y.o.   MRN: [id number]  HPI: Lindsey Blair is a 75 y.o. female  Chief Complaint  Patient presents with  . Gastroesophageal Reflux  . Hyperlipidemia  . Hypertension  . Depression   Continues on Testosterone and estradiol cream for atrophic vaginitis.  Has been on for several years.  HYPERTENSION / HYPERLIPIDEMIA Continues on Amlodipine, Benazepril, and Lipitor (started back on medication). December 2021 CRT 1.06 and GFR 51. Satisfied with current treatment? yes Duration of hypertension: chronic BP monitoring frequency: daily BP range: 116/60-70 BP medication side effects: no Duration of hyperlipidemia: chronic Cholesterol medication side effects: no Cholesterol supplements: none Medication compliance: good compliance Aspirin: no Recent stressors: no Recurrent headaches: no Visual changes: no Palpitations: no Dyspnea: no Chest pain: no Lower extremity edema: occasionally when on feet a lot during Dizzy/lightheaded: no   CHRONIC KIDNEY DISEASE Followed by nephrology and last seen by Dr. Abigail Butts 03/10/20.  Next visit in July. CKD status: stable Medications renally dose: yes Previous renal evaluation: yes Pneumovax:  Up to Date Influenza Vaccine:  Up to Date   SYSTEMIC LUPUS ERYTHEMATOSUS: Is followed by Dr. Evorn Gong for this.  Last saw him 11/03/2020 -- continues on Plaquenil for this.  Has no concerns about this at this time.    GERD Continues on Protonix daily. GERD control status: stable  Satisfied with current treatment? yes Heartburn frequency:  Medication side effects: no  Medication compliance: stable Dysphagia: no Odynophagia:  no Hematemesis: no Blood in stool: no EGD: no    DEPRESSION Continues on Ambien (very rarely takes -- prefers her Tizanidine), which benefits her insomnia and mood, plus Wellbutrin daily. Mood status: stable Satisfied with current treatment?: yes Symptom severity: mild  Duration of current treatment : chronic Side effects: no Medication compliance: good compliance Psychotherapy/counseling: none Depressed mood: no Anxious mood: no Anhedonia: no Significant weight loss or gain: no Insomnia: none Fatigue: no Feelings of worthlessness or guilt: no Impaired concentration/indecisiveness: no Suicidal ideations: no Hopelessness: no Crying spells: no Depression screen [facility] 2/9 02/08/2021 08/11/2020 08/10/2020 02/10/2020 08/12/2019  Decreased Interest 0 0 0 0 0  Down, Depressed, Hopeless 0 0 0 0 0  PHQ - 2 Score 0 0 0 0 0  Altered sleeping 1 1 1 1 1   Tired, decreased energy 0 0 0 0 0  Change in appetite 0 0 0 0 0  Feeling bad or failure about yourself  0 0 0 0 0  Trouble concentrating 0 0 0 1 0  Moving slowly or fidgety/restless 0 0 0 0 0  Suicidal thoughts 0 0 0 0 0  PHQ-9 Score 1 1 1 2 1   Difficult doing work/chores Not difficult at all - Not difficult at all Not difficult at all Not difficult at all  Some recent data might be hidden   BACK PAIN Chronic, taking Tizanidine -- taking every night -- helps sleep.  Duration: weeks Mechanism of injury: lifting Location: Right Onset: gradual Severity: 10/10 Quality: dull, aching and throbbing Frequency: intermittent Radiation: none Aggravating factors: lifting, movement and walking Alleviating factors: rest, ice, heat, NSAIDs and APAP Status: better Treatments  attempted: rest, ice, heat, APAP and ibuprofen  Relief with NSAIDs?: moderate Nighttime pain:  no Paresthesias / decreased sensation:  no Bowel / bladder incontinence:  no Fevers:  no Dysuria / urinary frequency:  no  Relevant past medical, surgical, family and social history reviewed and updated as indicated. Interim  medical history since our last visit reviewed. Allergies and medications reviewed and updated.  Review of Systems  Constitutional: Negative for activity change, appetite change, diaphoresis, fatigue and fever.  Respiratory: Negative for cough, chest tightness and shortness of breath.   Cardiovascular: Negative for chest pain, palpitations and leg swelling.  Gastrointestinal: Negative.   Neurological: Negative.   Psychiatric/Behavioral: Negative.     Per HPI unless specifically indicated above     Objective:    BP 137/73 (BP Location: Left Arm, Cuff Size: Normal)   Pulse (!) 58   Temp (!) 97.5 F (36.4 C) (Oral)   Ht 5' 2.5" (1.588 m)   Wt 125 lb 12.8 oz (57.1 kg)   SpO2 98%   BMI 22.64 kg/m   Wt Readings from Last 3 Encounters:  02/08/21 125 lb 12.8 oz (57.1 kg)  08/11/20 127 lb 12.5 oz (58 kg)  08/10/20 122 lb (55.3 kg)    Physical Exam Vitals and nursing note reviewed.  Constitutional:      General: She is awake. She is not in acute distress.    Appearance: She is well-developed and well-groomed. She is not ill-appearing.  HENT:     Head: Normocephalic.     Right Ear: Hearing normal.     Left Ear: Hearing normal.  Eyes:     General: Lids are normal.        Right eye: No discharge.        Left eye: No discharge.     Conjunctiva/sclera: Conjunctivae normal.     Pupils: Pupils are equal, round, and reactive to light.  Neck:     Thyroid: No thyromegaly.     Vascular: No carotid bruit.  Cardiovascular:     Rate and Rhythm: Normal rate and regular rhythm.     Heart sounds: Normal heart sounds. No murmur heard. No gallop.   Pulmonary:     Effort: Pulmonary effort is normal. No accessory muscle usage or respiratory distress.     Breath sounds: Normal breath sounds.  Abdominal:     General: Bowel sounds are normal.     Palpations: Abdomen is soft.  Musculoskeletal:     Cervical back: Normal range of motion and neck supple.     Lumbar back: No swelling, edema,  deformity, lacerations, spasms or tenderness. Normal range of motion. Negative right straight leg raise test and negative left straight leg raise test.     Right lower leg: No edema.     Left lower leg: No edema.  Skin:    General: Skin is warm and dry.  Neurological:     Mental Status: She is alert and oriented to person, place, and time.  Psychiatric:        Attention and Perception: Attention normal.        Mood and Affect: Mood normal.        Speech: Speech normal.        Behavior: Behavior normal. Behavior is cooperative.        Thought Content: Thought content normal.     Results for orders placed or performed in visit on 08/11/20  Comprehensive metabolic panel  Result Value Ref Range   Glucose  90 65 - 99 mg/dL   BUN 11 8 - 27 mg/dL   Creatinine, Ser 1.06 (H) 0.57 - 1.00 mg/dL   GFR calc non Af Amer 51 (L) >59 mL/min/1.73   GFR calc Af Amer 59 (L) >59 mL/min/1.73   BUN/Creatinine Ratio 10 (L) 12 - 28   Sodium 138 134 - 144 mmol/L   Potassium 4.2 3.5 - 5.2 mmol/L   Chloride 102 96 - 106 mmol/L   CO2 23 20 - 29 mmol/L   Calcium 9.6 8.7 - 10.3 mg/dL   Total Protein 6.8 6.0 - 8.5 g/dL   Albumin 4.6 3.7 - 4.7 g/dL   Globulin, Total 2.2 1.5 - 4.5 g/dL   Albumin/Globulin Ratio 2.1 1.2 - 2.2   Bilirubin Total 0.4 0.0 - 1.2 mg/dL   Alkaline Phosphatase 58 44 - 121 IU/L   AST 31 0 - 40 IU/L   ALT 20 0 - 32 IU/L  Lipid Panel w/o Chol/HDL Ratio  Result Value Ref Range   Cholesterol, Total 271 (H) 100 - 199 mg/dL   Triglycerides 101 0 - 149 mg/dL   HDL 74 >39 mg/dL   VLDL Cholesterol Cal 17 5 - 40 mg/dL   LDL Chol Calc (NIH) 180 (H) 0 - 99 mg/dL  TSH  Result Value Ref Range   TSH 1.190 0.450 - 4.500 uIU/mL  VITAMIN D 25 Hydroxy (Vit-D Deficiency, Fractures)  Result Value Ref Range   Vit D, 25-Hydroxy 38.3 30.0 - 100.0 ng/mL  Magnesium  Result Value Ref Range   Magnesium 2.1 1.6 - 2.3 mg/dL  [id number] 11+Oxyco+Alc+Crt-Bund  Result Value Ref Range   Ethanol Negative  Cutoff=0.020 %   Amphetamines, Urine Negative Cutoff=1000 ng/mL   Barbiturate Negative Cutoff=200 ng/mL   BENZODIAZ UR QL Negative Cutoff=200 ng/mL   Cannabinoid Quant, Ur Negative Cutoff=50 ng/mL   Cocaine (Metabolite) Negative Cutoff=300 ng/mL   OPIATE SCREEN URINE Negative Cutoff=300 ng/mL   Oxycodone/Oxymorphone, Urine Negative Cutoff=300 ng/mL   Phencyclidine Negative Cutoff=25 ng/mL   Methadone Screen, Urine Negative Cutoff=300 ng/mL   Propoxyphene Negative Cutoff=300 ng/mL   Meperidine Negative Cutoff=200 ng/mL   Tramadol See Final Results Cutoff=200 ng/mL   Creatinine 76.6 20.0 - 300.0 mg/dL   pH, Urine 7.6 4.5 - 8.9  Microalbumin, Urine Waived  Result Value Ref Range   Microalb, Ur Waived 10 0 - 19 mg/L   Creatinine, Urine Waived 50 10 - 300 mg/dL   Microalb/Creat Ratio <30 <30 mg/g  CBC with Differential/Platelet  Result Value Ref Range   WBC 4.3 3.4 - 10.8 x10E3/uL   RBC 5.38 (H) 3.77 - 5.28 x10E6/uL   Hemoglobin 13.3 11.1 - 15.9 g/dL   Hematocrit 42.6 34.0 - 46.6 %   MCV 79 79 - 97 fL   MCH 24.7 (L) 26.6 - 33.0 pg   MCHC 31.2 (L) 31.5 - 35.7 g/dL   RDW 13.0 11.7 - 15.4 %   Platelets 216 150 - 450 x10E3/uL   Neutrophils 56 Not Estab. %   Lymphs 31 Not Estab. %   Monocytes 10 Not Estab. %   Eos 2 Not Estab. %   Basos 1 Not Estab. %   Neutrophils Absolute 2.4 1.4 - 7.0 x10E3/uL   Lymphocytes Absolute 1.3 0.7 - 3.1 x10E3/uL   Monocytes Absolute 0.4 0.1 - 0.9 x10E3/uL   EOS (ABSOLUTE) 0.1 0.0 - 0.4 x10E3/uL   Basophils Absolute 0.0 0.0 - 0.2 x10E3/uL   Immature Granulocytes 0 Not Estab. %   Immature Grans (Abs)  0.0 0.0 - 0.1 x10E3/uL  Tramadol Gc/Ms, Urine  Result Value Ref Range   Tramadol Negative Cutoff=200      Assessment & Plan:   Problem List Items Addressed This Visit      Cardiovascular and Mediastinum   Hypertension    Chronic, stable with BP at goal for age in office and on home readings.  Continue combo pill for Benazepril-Amlodipine and adjust  as needed.  Continue current medication regimen and checking BP at home + focus on DASH diet.  CMP and TSH today.  Return in 6 months for physical.      Relevant Orders   Comprehensive metabolic panel   CBC with Differential/Platelet   TSH     Digestive   Reflux esophagitis    Chronic, stable.  Continue daily Protonix and attempt reduction to discontinuation in future if tolerated.  Refills sent in.  Mag level annually.        Musculoskeletal and Integument   Osteopenia    Ongoing, stable.  Next Dexa due in 2025.  Currently on holiday from Fosamax, started December 2021, as recent scores showing improvement with more osteopenia vs osteoporosis.  Check Vit D level.  Plan on repeat DEXA as noted above.      Relevant Orders   VITAMIN D 25 Hydroxy (Vit-D Deficiency, Fractures)     Genitourinary   Atrophic vaginitis    Chronic, ongoing, continue vaginal estrace and her testosterone cream which offer benefit.  Has been on these for several years, aware of risks. Refills sent in and PDMP reviewed.  Testosterone level today.      Relevant Orders   Testosterone, free, total([facility])   CKD (chronic kidney disease) stage 3, GFR 30-59 ml/min ([facility])    Chronic, ongoing.  Continue collaboration with nephrology and Benazepril for kidney protection.  CMP today.        Other   Hyperlipidemia    Chronic, ongoing.  Continue current medication regimen and adjust as needed.  Recheck lipid panel today as is back on her medication.      Relevant Orders   Lipid Panel w/o Chol/HDL Ratio   Depression    Chronic, stable.  Denies SI/HI.  Continue current medication regimen and adjust as needed. Refills sent in.  Return in 6 months for physical.      Insomnia    Chronic, ongoing with poor control with Melatonin.  Can not take Trazodone due to interaction with Plaquenil, QT prolongation, and similar is with Seroquel.  Has taken Ambien in past with benefit and this appears not to interact.   Discussed at length with her risk of this medication in age over 37, but she wishes to continue to use this at lowest dose and is aware can not go above 5 MG.  She is rarely using at this time as more benefit from Tizanidine.   UDS up to date and contract next visit, discussed with her controlled substance office rules.  PDMP reviewed.  Return in 6 months for physical.      Systemic lupus erythematosus ([place]) - Primary    Chronic, stable.  Continue current medication regimen and collaboration with dermatology at [facility].        Back pain    Waxes and wanes, chronic at baseline.  Will continue Tizanidine, which is safe for her age group -- is also benefiting her sleep and would prefer this be used vs Ambien.  Recommend use of Tylenol at home as needed + alternating ice and  heat.  Use of OTC Voltaren and Icy/Hot gel as needed.  Return to office in 6 months.          Follow up plan: Return in about 6 months (around 08/12/2021) for annual physical.

## 2021-02-08 NOTE — Assessment & Plan Note (Signed)
Waxes and wanes, chronic at baseline.  Will continue Tizanidine, which is safe for her age group -- is also benefiting her sleep and would prefer this be used vs Ambien.  Recommend use of Tylenol at home as needed + alternating ice and heat.  Use of OTC Voltaren and Icy/Hot gel as needed.  Return to office in 6 months.

## 2021-02-08 NOTE — Assessment & Plan Note (Signed)
Chronic, ongoing with poor control with Melatonin.  Can not take Trazodone due to interaction with Plaquenil, QT prolongation, and similar is with Seroquel.  Has taken Ambien in past with benefit and this appears not to interact.  Discussed at length with her risk of this medication in age over 40, but she wishes to continue to use this at lowest dose and is aware can not go above 5 MG.  She is rarely using at this time as more benefit from Tizanidine.   UDS up to date and contract next visit, discussed with her controlled substance office rules.  PDMP reviewed.  Return in 6 months for physical.

## 2021-02-08 NOTE — Assessment & Plan Note (Signed)
Chronic, stable.  Continue current medication regimen and collaboration with dermatology at [facility].   

## 2021-02-08 NOTE — Assessment & Plan Note (Signed)
Chronic, ongoing.  Continue collaboration with nephrology and Benazepril for kidney protection.  CMP today.

## 2021-02-08 NOTE — Assessment & Plan Note (Signed)
Chronic, stable with BP at goal for age in office and on home readings.  Continue combo pill for Benazepril-Amlodipine and adjust as needed.  Continue current medication regimen and checking BP at home + focus on DASH diet.  CMP and TSH today.  Return in 6 months for physical.

## 2021-02-08 NOTE — Patient Instructions (Signed)

## 2021-02-08 NOTE — Assessment & Plan Note (Addendum)
Chronic, ongoing, continue vaginal estrace and her testosterone cream which offer benefit.  Has been on these for several years, aware of risks. Refills sent in and PDMP reviewed.  Testosterone level today.

## 2021-02-08 NOTE — Assessment & Plan Note (Signed)
Ongoing, stable.  Next Dexa due in 2025.  Currently on holiday from Fosamax, started December 2021, as recent scores showing improvement with more osteopenia vs osteoporosis.  Check Vit D level.  Plan on repeat DEXA as noted above.

## 2021-02-08 NOTE — Assessment & Plan Note (Signed)
Chronic, ongoing.  Continue current medication regimen and adjust as needed.  Recheck lipid panel today as is back on her medication.

## 2021-02-08 NOTE — Assessment & Plan Note (Signed)
Chronic, stable.  Denies SI/HI.  Continue current medication regimen and adjust as needed. Refills sent in.  Return in 6 months for physical.

## 2021-02-09 LAB — COMPREHENSIVE METABOLIC PANEL
ALT: 20 IU/L (ref 0–32)
AST: 26 IU/L (ref 0–40)
Albumin/Globulin Ratio: 1.9 (ref 1.2–2.2)
Albumin: 4.3 g/dL (ref 3.7–4.7)
Alkaline Phosphatase: 77 IU/L (ref 44–121)
BUN/Creatinine Ratio: 10 — ABNORMAL LOW (ref 12–28)
BUN: 10 mg/dL (ref 8–27)
Bilirubin Total: 0.5 mg/dL (ref 0.0–1.2)
CO2: 20 mmol/L (ref 20–29)
Calcium: 9.5 mg/dL (ref 8.7–10.3)
Chloride: 104 mmol/L (ref 96–106)
Creatinine, Ser: 1.01 mg/dL — ABNORMAL HIGH (ref 0.57–1.00)
Globulin, Total: 2.3 g/dL (ref 1.5–4.5)
Glucose: 74 mg/dL (ref 65–99)
Potassium: 4.2 mmol/L (ref 3.5–5.2)
Sodium: 141 mmol/L (ref 134–144)
Total Protein: 6.6 g/dL (ref 6.0–8.5)
eGFR: 58 mL/min/{1.73_m2} — ABNORMAL LOW (ref >59)

## 2021-02-09 LAB — CBC WITH DIFFERENTIAL/PLATELET
Basophils Absolute: 0 10*3/uL (ref 0.0–0.2)
Basos: 1 %
EOS (ABSOLUTE): 0.1 10*3/uL (ref 0.0–0.4)
Eos: 2 %
Hematocrit: 45.4 % (ref 34.0–46.6)
Hemoglobin: 14.6 g/dL (ref 11.1–15.9)
Immature Grans (Abs): 0 10*3/uL (ref 0.0–0.1)
Immature Granulocytes: 0 %
Lymphocytes Absolute: 1.4 10*3/uL (ref 0.7–3.1)
Lymphs: 33 %
MCH: 27 pg (ref 26.6–33.0)
MCHC: 32.2 g/dL (ref 31.5–35.7)
MCV: 84 fL (ref 79–97)
Monocytes Absolute: 0.4 10*3/uL (ref 0.1–0.9)
Monocytes: 10 %
Neutrophils Absolute: 2.2 10*3/uL (ref 1.4–7.0)
Neutrophils: 54 %
Platelets: 191 10*3/uL (ref 150–450)
RBC: 5.4 x10E6/uL — ABNORMAL HIGH (ref 3.77–5.28)
RDW: 13.6 % (ref 11.7–15.4)
WBC: 4.1 10*3/uL (ref 3.4–10.8)

## 2021-02-09 LAB — LIPID PANEL W/O CHOL/HDL RATIO
Cholesterol, Total: 251 mg/dL — ABNORMAL HIGH (ref 100–199)
HDL: 73 mg/dL (ref >39)
LDL Chol Calc (NIH): 161 mg/dL — ABNORMAL HIGH (ref 0–99)
Triglycerides: 100 mg/dL (ref 0–149)
VLDL Cholesterol Cal: 17 mg/dL (ref 5–40)

## 2021-02-09 LAB — TESTOSTERONE, FREE, TOTAL, SHBG
Sex Hormone Binding: 83 nmol/L (ref 17.3–125.0)
Testosterone, Free: 0.3 pg/mL (ref 0.0–4.2)
Testosterone: 6 ng/dL (ref 3–67)

## 2021-02-09 LAB — VITAMIN D 25 HYDROXY (VIT D DEFICIENCY, FRACTURES): Vit D, 25-Hydroxy: 43 ng/mL (ref 30.0–100.0)

## 2021-02-09 LAB — TSH: TSH: 0.85 u[IU]/mL (ref 0.450–4.500)

## 2021-02-09 NOTE — Progress Notes (Signed)
Please let Lindsey Blair know her labs have returned: - Kidney function continues to show some mild kidney disease, but no worsening, we will continue to monitor closely.  Liver function normal. - CBC is normal with no anemia - Cholesterol levels remain elevated this check, I recommend taking Atorvastatin daily and we will recheck this fasting next visit, since you just restarted levels may not show improvement until next check. - Thyroid level normal.  Vitamin D level normal. - Testosterone levels in normal range, low side.  I have sent in refills, but over time you may want to consider reduction to discontinuation of testosterone as it if not recommended long term and can place at risk for heart issues.  Any questions? Keep being awesome!!  Thank you for allowing me to participate in your care.  I appreciate you. Kindest regards, Alanna Storti

## 2021-02-09 NOTE — Telephone Encounter (Signed)
Seth Bake from the pharmacy called to check the status for someone to give her a call, pertaining to the medication. Please advise

## 2021-02-09 NOTE — Telephone Encounter (Signed)
Called [facility] and spoke to [place]. She states that the way we wrote the prescription is not correct because its written for the generic and not controlled. She can take a verbal to change the RX though.   1. The 4% gel has to be compounded. Is it ok to do this?  2. The 4% would equal 40 mg per milliliter. Is this the dose you are wanting the patient to have?

## 2021-02-09 NOTE — Telephone Encounter (Signed)
Can provide verbal to change rx.  Yes, compounding is fine.  Dr. Jeananne Rama started patient on this years ago and goal in long term is to discontinue with further discussions with patient.  Would only like her to apply 40 MG total with her dosing 3 days a week, can we do this with the 4% or should we decrease to the 1.62%?

## 2021-02-10 NOTE — Telephone Encounter (Signed)
Called and notified pharmacy of Lindsey Blair's message.

## 2021-02-17 ENCOUNTER — Other Ambulatory Visit: Payer: Self-pay | Admitting: Internal Medicine

## 2021-02-17 NOTE — Telephone Encounter (Signed)
Scheduled 1/29

## 2021-02-17 NOTE — Telephone Encounter (Signed)
Requested medication (s) are due for refill today: no  Requested medication (s) are on the active medication list: yes   Last refill: 12/30/2020  Future visit scheduled:  yes   Notes to clinic: this refill cannot be delegated    Requested Prescriptions  Pending Prescriptions Disp Refills   zolpidem (AMBIEN) 5 MG tablet [Pharmacy Med Name: ZOLPIDEM TARTRATE 5 MG TABLET] 30 tablet 0    Sig: Take 1 tablet (5 mg total) by mouth at bedtime as needed for sleep.      Not Delegated - Psychiatry:  Anxiolytics/Hypnotics Failed - 02/17/2021  8:58 AM      Failed - This refill cannot be delegated      Passed - Urine Drug Screen completed in last 360 days      Passed - Valid encounter within last 6 months    Recent Outpatient Visits           1 week ago Systemic lupus erythematosus, unspecified SLE type, unspecified organ involvement status ([place])   [place], Jolene T, NP   6 months ago Stage 3a chronic kidney disease ([place])   [place], Jolene T, NP   1 year ago Recurrent major depressive disorder, in full remission ([place])   [place], Henrine Screws T, NP   1 year ago Vaginal dryness, menopausal   [facility] Eulogio Bear, NP   1 year ago Annual physical exam   [place] Cannady, Barbaraann Faster, NP       Future Appointments             In 5 months  [place] [place]   In 5 months Cannady, Barbaraann Faster, NP [facility]

## 2021-03-04 ENCOUNTER — Other Ambulatory Visit: Payer: Self-pay

## 2021-03-04 ENCOUNTER — Ambulatory Visit
Admission: RE | Admit: 2021-03-04 | Discharge: 2021-03-04 | Disposition: A | Discharge status: Home / Self Care | Payer: PPO | Source: Ambulatory Visit | Attending: Nurse Practitioner | Admitting: Nurse Practitioner

## 2021-03-04 DIAGNOSIS — Z1231 Encounter for screening mammogram for malignant neoplasm of breast: Secondary | ICD-10-CM | POA: Diagnosis not present

## 2021-03-10 DIAGNOSIS — M329 Systemic lupus erythematosus, unspecified: Secondary | ICD-10-CM | POA: Diagnosis not present

## 2021-03-10 DIAGNOSIS — I129 Hypertensive chronic kidney disease with stage 1 through stage 4 chronic kidney disease, or unspecified chronic kidney disease: Secondary | ICD-10-CM | POA: Diagnosis not present

## 2021-03-10 DIAGNOSIS — R319 Hematuria, unspecified: Secondary | ICD-10-CM | POA: Diagnosis not present

## 2021-03-10 DIAGNOSIS — N1831 Chronic kidney disease, stage 3a: Secondary | ICD-10-CM | POA: Diagnosis not present

## 2021-03-10 DIAGNOSIS — R809 Proteinuria, unspecified: Secondary | ICD-10-CM | POA: Diagnosis not present

## 2021-04-29 DIAGNOSIS — Z79899 Other long term (current) drug therapy: Secondary | ICD-10-CM | POA: Diagnosis not present

## 2021-05-13 DIAGNOSIS — D225 Melanocytic nevi of trunk: Secondary | ICD-10-CM | POA: Diagnosis not present

## 2021-05-13 DIAGNOSIS — L570 Actinic keratosis: Secondary | ICD-10-CM | POA: Diagnosis not present

## 2021-05-13 DIAGNOSIS — Z79899 Other long term (current) drug therapy: Secondary | ICD-10-CM | POA: Diagnosis not present

## 2021-05-13 DIAGNOSIS — X32XXXA Exposure to sunlight, initial encounter: Secondary | ICD-10-CM | POA: Diagnosis not present

## 2021-05-13 DIAGNOSIS — D2261 Melanocytic nevi of right upper limb, including shoulder: Secondary | ICD-10-CM | POA: Diagnosis not present

## 2021-05-13 DIAGNOSIS — Z85828 Personal history of other malignant neoplasm of skin: Secondary | ICD-10-CM | POA: Diagnosis not present

## 2021-05-13 DIAGNOSIS — D2262 Melanocytic nevi of left upper limb, including shoulder: Secondary | ICD-10-CM | POA: Diagnosis not present

## 2021-05-18 ENCOUNTER — Ambulatory Visit: Payer: Self-pay | Admitting: *Deleted

## 2021-05-18 NOTE — Telephone Encounter (Signed)
Patient is calling to report she has tested + COVID. Patient reports her symptoms started Sunday and she tested at home today. Patient reports: cough, congestion, headache, low grade temp, fatigue, body aches. Patient advised per COVID protocol- treatment/isolation. Call forwarded to office for review and possible treatment- moderate risk.

## 2021-05-18 NOTE — Telephone Encounter (Signed)
Patient called in to inquire if she could be seen today for a virtual visit she tested positive for Covid and her symptoms include headache, sore throat, dry hacking cough, and low grade fever.  Please call Ph# [phone]  Reason for Disposition  [1] HIGH RISK for severe COVID complications (e.g., weak immune system, age > 71 years, obesity with BMI > 25, pregnant, chronic lung disease or other chronic medical condition) AND [2] COVID symptoms (e.g., cough, fever)  (Exceptions: Already seen by PCP and no new or worsening symptoms.)  Answer Assessment - Initial Assessment Questions 1. COVID-19 DIAGNOSIS: "Who made your COVID-19 diagnosis?" "Was it confirmed by a positive lab test or self-test?" If not diagnosed by a doctor (or NP/PA), ask "Are there lots of cases (community spread) where you live?" Note: See public health department website, if unsure.     + COVID home test 2. COVID-19 EXPOSURE: "Was there any known exposure to COVID before the symptoms began?" CDC Definition of close contact: within 6 feet (2 meters) for a total of 15 minutes or more over a 24-hour period.      Yes- sister tested + 3. ONSET: "When did the COVID-19 symptoms start?"      Sunday- headache 4. WORST SYMPTOM: "What is your worst symptom?" (e.g., cough, fever, shortness of breath, muscle aches)     Headache,sore throat 5. COUGH: "Do you have a cough?" If Yes, ask: "How bad is the cough?"       Yes-cough spells- croupy 6. FEVER: "Do you have a fever?" If Yes, ask: "What is your temperature, how was it measured, and when did it start?"     Low grade- less than 100 7. RESPIRATORY STATUS: "Describe your breathing?" (e.g., shortness of breath, wheezing, unable to speak)      Reports breathing ok- no SOB 8. BETTER-SAME-WORSE: "Are you getting better, staying the same or getting worse compared to yesterday?"  If getting worse, ask, "In what way?"     Same- no improvement in symptoms 9. HIGH RISK DISEASE: "Do you have any  chronic medical problems?" (e.g., asthma, heart or lung disease, weak immune system, obesity, etc.)     High BP, age 75. VACCINE: "Have you had the COVID-19 vaccine?" If Yes, ask: "Which one, how many shots, when did you get it?"       Yes-Moderna 11. BOOSTER: "Have you received your COVID-19 booster?" If Yes, ask: "Which one and when did you get it?"       Yes-2- 4/22 12. PREGNANCY: "Is there any chance you are pregnant?" "When was your last menstrual period?"       N/a 13. OTHER SYMPTOMS: "Do you have any other symptoms?"  (e.g., chills, fatigue, headache, loss of smell or taste, muscle pain, sore throat)       Muscle pain, fatigue, sore throat 14. O2 SATURATION MONITOR:  "Do you use an oxygen saturation monitor (pulse oximeter) at home?" If Yes, ask "What is your reading (oxygen level) today?" "What is your usual oxygen saturation reading?" (e.g., 95%)       no  Protocols used: Coronavirus (COVID-19) Diagnosed or Suspected-A-AH

## 2021-05-18 NOTE — Telephone Encounter (Signed)
FYI for appointment tomorrow.  

## 2021-05-18 NOTE — Progress Notes (Signed)
There were no vitals taken for this visit.   Subjective:    Patient ID: Lindsey Blair, female    DOB: [birth year], 75 y.o.   MRN: [id number]  HPI: Lindsey Blair is a 75 y.o. female  Chief Complaint  Patient presents with   Covid Positive    Pt states she tested positive for covid on Monday, symptoms started Sunday. States she has had a sore throat, cough, fever, headache, and fatigue.    UPPER RESPIRATORY TRACT INFECTION Worst symptom: tested positive for COVID on Monday. Symptoms started on Sunday. Fever: yes Cough: yes Shortness of breath: no Wheezing: no Chest pain: no Chest tightness: no Chest congestion: yes Nasal congestion: yes Runny nose: yes Post nasal drip: yes Sneezing: yes Sore throat: yes Swollen glands: no Sinus pressure: no Headache: yes Face pain: no Toothache: no Ear pain: no bilateral Ear pressure: no bilateral Eyes red/itching:no Eye drainage/crusting: no  Vomiting: no Rash: no Fatigue: yes Sick contacts: yes Strep contacts: no  Context: stable Recurrent sinusitis: no Relief with OTC cold/cough medications:  tylenol   Treatments attempted:  tylenol is helping some with sore throat.     Relevant past medical, surgical, family and social history reviewed and updated as indicated. Interim medical history since our last visit reviewed. Allergies and medications reviewed and updated.  Review of Systems  Constitutional:  Positive for fatigue and fever.  HENT:  Positive for congestion, postnasal drip, rhinorrhea, sneezing and sore throat. Negative for dental problem, ear pain, sinus pressure and sinus pain.   Respiratory:  Positive for cough. Negative for shortness of breath and wheezing.   Cardiovascular:  Negative for chest pain.  Gastrointestinal:  Negative for vomiting.  Skin:  Negative for rash.  Neurological:  Positive for headaches.   Per HPI unless specifically indicated above     Objective:    There were no vitals taken for  this visit.  Wt Readings from Last 3 Encounters:  02/08/21 125 lb 12.8 oz (57.1 kg)  08/11/20 127 lb 12.5 oz (58 kg)  08/10/20 122 lb (55.3 kg)    Physical Exam Vitals and nursing note reviewed.  Pulmonary:     Effort: Pulmonary effort is normal. No respiratory distress.  Neurological:     Mental Status: She is alert.  Psychiatric:        Mood and Affect: Mood normal.        Behavior: Behavior normal.        Thought Content: Thought content normal.        Judgment: Judgment normal.    Results for orders placed or performed in visit on 02/08/21  Comprehensive metabolic panel  Result Value Ref Range   Glucose 74 65 - 99 mg/dL   BUN 10 8 - 27 mg/dL   Creatinine, Ser 1.01 (H) 0.57 - 1.00 mg/dL   eGFR 58 (L) >59 mL/min/1.73   BUN/Creatinine Ratio 10 (L) 12 - 28   Sodium 141 134 - 144 mmol/L   Potassium 4.2 3.5 - 5.2 mmol/L   Chloride 104 96 - 106 mmol/L   CO2 20 20 - 29 mmol/L   Calcium 9.5 8.7 - 10.3 mg/dL   Total Protein 6.6 6.0 - 8.5 g/dL   Albumin 4.3 3.7 - 4.7 g/dL   Globulin, Total 2.3 1.5 - 4.5 g/dL   Albumin/Globulin Ratio 1.9 1.2 - 2.2   Bilirubin Total 0.5 0.0 - 1.2 mg/dL   Alkaline Phosphatase 77 44 - 121 IU/L   AST 26 0 -  40 IU/L   ALT 20 0 - 32 IU/L  CBC with Differential/Platelet  Result Value Ref Range   WBC 4.1 3.4 - 10.8 x10E3/uL   RBC 5.40 (H) 3.77 - 5.28 x10E6/uL   Hemoglobin 14.6 11.1 - 15.9 g/dL   Hematocrit 45.4 34.0 - 46.6 %   MCV 84 79 - 97 fL   MCH 27.0 26.6 - 33.0 pg   MCHC 32.2 31.5 - 35.7 g/dL   RDW 13.6 11.7 - 15.4 %   Platelets 191 150 - 450 x10E3/uL   Neutrophils 54 Not Estab. %   Lymphs 33 Not Estab. %   Monocytes 10 Not Estab. %   Eos 2 Not Estab. %   Basos 1 Not Estab. %   Neutrophils Absolute 2.2 1.4 - 7.0 x10E3/uL   Lymphocytes Absolute 1.4 0.7 - 3.1 x10E3/uL   Monocytes Absolute 0.4 0.1 - 0.9 x10E3/uL   EOS (ABSOLUTE) 0.1 0.0 - 0.4 x10E3/uL   Basophils Absolute 0.0 0.0 - 0.2 x10E3/uL   Immature Granulocytes 0 Not Estab. %    Immature Grans (Abs) 0.0 0.0 - 0.1 x10E3/uL  Lipid Panel w/o Chol/HDL Ratio  Result Value Ref Range   Cholesterol, Total 251 (H) 100 - 199 mg/dL   Triglycerides 100 0 - 149 mg/dL   HDL 73 >39 mg/dL   VLDL Cholesterol Cal 17 5 - 40 mg/dL   LDL Chol Calc (NIH) 161 (H) 0 - 99 mg/dL  TSH  Result Value Ref Range   TSH 0.850 0.450 - 4.500 uIU/mL  VITAMIN D 25 Hydroxy (Vit-D Deficiency, Fractures)  Result Value Ref Range   Vit D, 25-Hydroxy 43.0 30.0 - 100.0 ng/mL  Testosterone, free, total([facility])  Result Value Ref Range   Testosterone 6 3 - 67 ng/dL   Testosterone, Free 0.3 0.0 - 4.2 pg/mL   Sex Hormone Binding 83.0 17.3 - 125.0 nmol/L      Assessment & Plan:   Problem List Items Addressed This Visit   None Visit Diagnoses     COVID-19    -  Primary   Molnupiravir sent. Tessalon and Magic Mouthwash sent for symptom management. Reviewed s/s and when to follow up. Patient wanted medication sent to [facility].   Relevant Medications   molnupiravir EUA (LAGEVRIO) 200 mg CAPS capsule        Follow up plan: Return if symptoms worsen or fail to improve.   This visit was completed via MyChart due to the restrictions of the COVID-19 pandemic. All issues as above were discussed and addressed. Physical exam was done as above through visual confirmation on MyChart. If it was felt that the patient should be evaluated in the office, they were directed there. The patient verbally consented to this visit. Location of the patient: Home Location of the provider: Office Those involved with this call:  Provider: Jon Billings, NP CMA: Yvonna Alanis, [place] Desk/Registration: Yvonna Alanis, CMA This encounter was conducted via telephone.  I spent 15 dedicated to the care of this patient on the date of this encounter to include previsit review of 21, face to face time with the patient, and post visit ordering of testing.

## 2021-05-19 ENCOUNTER — Other Ambulatory Visit: Payer: Self-pay

## 2021-05-19 ENCOUNTER — Ambulatory Visit (INDEPENDENT_AMBULATORY_CARE_PROVIDER_SITE_OTHER): Payer: PPO | Admitting: Nurse Practitioner

## 2021-05-19 ENCOUNTER — Encounter: Payer: Self-pay | Admitting: Nurse Practitioner

## 2021-05-19 DIAGNOSIS — U071 COVID-19: Secondary | ICD-10-CM

## 2021-05-19 MED ORDER — MOLNUPIRAVIR EUA 200MG CAPSULE: 4.0000 | ORAL_CAPSULE | Freq: Two times a day (BID) | ORAL | 0 refills | Status: AC

## 2021-05-19 MED ORDER — BENZONATATE 200 MG PO CAPS
200.0000 mg | ORAL_CAPSULE | Freq: Three times a day (TID) | ORAL | 0 refills | Status: DC | PRN
Start: 2021-05-19 — End: 2021-06-15

## 2021-05-19 MED ORDER — LIDOCAINE VISCOUS HCL 2 % MT SOLN: 5.0000 mL | Freq: Three times a day (TID) | OROMUCOSAL | 0 refills | Status: DC

## 2021-05-20 ENCOUNTER — Telehealth: Payer: Self-pay | Admitting: Internal Medicine

## 2021-05-20 ENCOUNTER — Emergency Department
Admission: EM | Admit: 2021-05-20 | Discharge: 2021-05-20 | Disposition: A | Discharge status: Home / Self Care | Payer: PPO | Attending: Emergency Medicine | Admitting: Emergency Medicine

## 2021-05-20 ENCOUNTER — Other Ambulatory Visit: Payer: Self-pay

## 2021-05-20 ENCOUNTER — Ambulatory Visit: Payer: Self-pay | Admitting: *Deleted

## 2021-05-20 DIAGNOSIS — Z87891 Personal history of nicotine dependence: Secondary | ICD-10-CM | POA: Diagnosis not present

## 2021-05-20 DIAGNOSIS — J029 Acute pharyngitis, unspecified: Secondary | ICD-10-CM | POA: Diagnosis present

## 2021-05-20 DIAGNOSIS — Z85828 Personal history of other malignant neoplasm of skin: Secondary | ICD-10-CM | POA: Diagnosis not present

## 2021-05-20 DIAGNOSIS — Z79899 Other long term (current) drug therapy: Secondary | ICD-10-CM | POA: Insufficient documentation

## 2021-05-20 DIAGNOSIS — J028 Acute pharyngitis due to other specified organisms: Secondary | ICD-10-CM

## 2021-05-20 DIAGNOSIS — N183 Chronic kidney disease, stage 3 unspecified: Secondary | ICD-10-CM | POA: Diagnosis not present

## 2021-05-20 DIAGNOSIS — I129 Hypertensive chronic kidney disease with stage 1 through stage 4 chronic kidney disease, or unspecified chronic kidney disease: Secondary | ICD-10-CM | POA: Diagnosis not present

## 2021-05-20 DIAGNOSIS — U071 COVID-19: Secondary | ICD-10-CM | POA: Diagnosis not present

## 2021-05-20 MED ORDER — LIDOCAINE VISCOUS HCL 2 % MT SOLN: 15.0000 mL | OROMUCOSAL | 0 refills | Status: DC | PRN

## 2021-05-20 MED ORDER — ACETAMINOPHEN-CODEINE #3 300-30 MG PO TABS: 1.0000 | ORAL_TABLET | ORAL | 0 refills | Status: DC | PRN

## 2021-05-20 MED ORDER — OXYCODONE-ACETAMINOPHEN 5-325 MG PO TABS
1.0000 | ORAL_TABLET | ORAL | Status: DC | PRN
  Administered 2021-05-20: 1 via ORAL
  Filled 2021-05-20: qty 1

## 2021-05-20 NOTE — Telephone Encounter (Signed)
Patients husband called in , medication that was given to patient he says, doesn't seem to be working. She is having the same symptoms as previous and fever is 100. Please call back       Pt's husband calling, pt present "But she can hardly speak." Tested positive covid yesterday and triaged, appt.   Reports cough and sore throat worsening. Temp. 100.0 HAs taken meds prescribed yesterday, "Feel worse." Did speak to pt. Reports sore throat "So bad I can't swallow my spit, need to spit it out." Has not been hydrating. States cough worse, yellow greenish phelgm. Reports urinating less. States "Maybe a little SOB." Advised ED. States will follow disposition.  Reason for Disposition  Patient sounds very sick or weak to the triager  Answer Assessment - Initial Assessment Questions 1. COVID-19 DIAGNOSIS: "Who made your COVID-19 diagnosis?" "Was it confirmed by a positive lab test or self-test?" If not diagnosed by a doctor (or NP/PA), ask "Are there lots of cases (community spread) where you live?" Note: See public health department website, if unsure.      Please see summary. Worsening symptoms.    2. COVID-19 EXPOSURE: "Was there any known exposure to COVID before the symptoms began?" CDC Definition of close contact: within 6 feet (2 meters) for a total of 15 minutes or more over a 24-hour period.      *No Answer* 3. ONSET: "When did the COVID-19 symptoms start?"      *No Answer* 4. WORST SYMPTOM: "What is your worst symptom?" (e.g., cough, fever, shortness of breath, muscle aches)     *No Answer* 5. COUGH: "Do you have a cough?" If Yes, ask: "How bad is the cough?"       *No Answer* 6. FEVER: "Do you have a fever?" If Yes, ask: "What is your temperature, how was it measured, and when did it start?"     *No Answer* 7. RESPIRATORY STATUS: "Describe your breathing?" (e.g., shortness of breath, wheezing, unable to speak)      *No Answer* 8. BETTER-SAME-WORSE: "Are you getting better, staying the  same or getting worse compared to yesterday?"  If getting worse, ask, "In what way?"     *No Answer* 9. HIGH RISK DISEASE: "Do you have any chronic medical problems?" (e.g., asthma, heart or lung disease, weak immune system, obesity, etc.)     *No Answer* 10. VACCINE: "Have you had the COVID-19 vaccine?" If Yes, ask: "Which one, how many shots, when did you get it?"       *No Answer* 11. BOOSTER: "Have you received your COVID-19 booster?" If Yes, ask: "Which one and when did you get it?"       *No Answer* 12. PREGNANCY: "Is there any chance you are pregnant?" "When was your last menstrual period?"       *No Answer* 13. OTHER SYMPTOMS: "Do you have any other symptoms?"  (e.g., chills, fatigue, headache, loss of smell or taste, muscle pain, sore throat)       *No Answer* 14. O2 SATURATION MONITOR:  "Do you use an oxygen saturation monitor (pulse oximeter) at home?" If Yes, ask "What is your reading (oxygen level) today?" "What is your usual oxygen saturation reading?" (e.g., 95%)       *No Answer*  Protocols used: Coronavirus (COVID-19) Diagnosed or Suspected-A-AH

## 2021-05-20 NOTE — Telephone Encounter (Signed)
Routing to provider as an Pharmacist, hospital. Patient is currently at the ED.

## 2021-05-20 NOTE — ED Triage Notes (Signed)
Pt here via POV with CC of COVID symptoms (cough, fever, sore throat). Pt had positive COVID test on 9/12.

## 2021-05-20 NOTE — Telephone Encounter (Signed)
Noted  

## 2021-05-20 NOTE — ED Provider Notes (Signed)
[facility] Emergency Department Provider Note  ____________________________________________  Time seen: Approximately 5:43 PM  I have reviewed the triage vital signs and the nursing notes.   HISTORY  Chief Complaint Sore Throat    HPI Lindsey Blair is a 75 y.o. female who presents to the emergency department for treatment and evaluation of sore throat.  Patient states that she tested positive for COVID-19 on September 12 and her symptoms with the exception of her sore throat have been controlled with Tylenol and ibuprofen.  Sore throat has been getting worse daily.  Past Medical History:  Diagnosis Date   Anxiety    Cancer ([place]) Several instances of the years   Skin   Chronic kidney disease    Cutaneous lupus erythematosus    Depression    Hyperlipidemia    Hypertension    Vertigo    rare    Patient Active Problem List   Diagnosis Date Noted   Back pain 02/10/2020   Osteopenia 08/12/2019   CKD (chronic kidney disease) stage 3, GFR 30-59 ml/min (HCC) 08/11/2019   Systemic lupus erythematosus ([place]) 08/11/2019   Insomnia 03/06/2017   Advanced care planning/counseling discussion 03/06/2017   Atrophic vaginitis 07/20/2016   Reflux esophagitis    Hypertension 04/27/2015   Hyperlipidemia    Depression     Past Surgical History:  Procedure Laterality Date   BASAL CELL CARCINOMA EXCISION     BREAST BIOPSY Right 2000   Negative   CATARACT EXTRACTION Right 08/05/2019   COLONOSCOPY WITH PROPOFOL N/A 10/22/2015   Procedure: COLONOSCOPY WITH PROPOFOL;  Surgeon: Lucilla Lame, MD;  Location: [facility];  Service: Endoscopy;  Laterality: N/A;   ESOPHAGOGASTRODUODENOSCOPY (EGD) WITH PROPOFOL N/A 10/22/2015   Procedure: ESOPHAGOGASTRODUODENOSCOPY (EGD) WITH PROPOFOL;  Surgeon: Lucilla Lame, MD;  Location: [facility];  Service: Endoscopy;  Laterality: N/A;   IMAGE GUIDED SINUS SURGERY  04/06/11   [facility], Dr. Pryor Ochoa   MASTOIDECTOMY       Prior to Admission medications   Medication Sig Start Date End Date Taking? Authorizing Provider  acetaminophen-codeine (TYLENOL #3) 300-30 MG tablet Take 1 tablet by mouth every 4 (four) hours as needed for moderate pain. 05/20/21  Yes Salia Cangemi B, FNP  lidocaine (XYLOCAINE) 2 % solution Use as directed 15 mLs in the mouth or throat as needed for mouth pain. 05/20/21  Yes Torri Michalski B, FNP  amLODipine-benazepril (LOTREL) 10-20 MG capsule Take 1 capsule by mouth daily. 08/11/20   Cannady, Henrine Screws T, NP  atorvastatin (LIPITOR) 20 MG tablet Take 1 tablet (20 mg total) by mouth daily. 08/12/20   Cannady, Henrine Screws T, NP  benzonatate (TESSALON) 200 MG capsule Take 1 capsule (200 mg total) by mouth 3 (three) times daily as needed for cough. 05/19/21   Jon Billings, NP  buPROPion (WELLBUTRIN SR) 150 MG 12 hr tablet Take 1 tablet (150 mg total) by mouth 2 (two) times daily. 08/11/20   Cannady, Henrine Screws T, NP  estradiol (ESTRACE VAGINAL) 0.1 MG/GM vaginal cream Place 1 Applicatorful vaginally at bedtime. Place pea-sized amount vaginally at bedtime. 10/03/19   Eulogio Bear, NP  hydroxychloroquine (PLAQUENIL) 200 MG tablet Take 200 mg by mouth daily.    [provider]  magic mouthwash (lidocaine, diphenhydrAMINE, alum & mag hydroxide) suspension Swish and spit 5 mLs 3 (three) times daily. 05/19/21   Jon Billings, NP  MELATONIN PO Take 1 Dose by mouth at bedtime.    [provider]  molnupiravir EUA (LAGEVRIO) 200  mg CAPS capsule Take 4 capsules (800 mg total) by mouth 2 (two) times daily for 5 days. 05/19/21 05/24/21  Jon Billings, NP  Multiple Vitamin (MULTI-VITAMIN DAILY PO) Take by mouth.    [provider]  pantoprazole (PROTONIX) 40 MG tablet Take 1 tablet (40 mg total) by mouth daily. 08/11/20   Cannady, Henrine Screws T, NP  Testosterone 1.62 % GEL Place 1 application onto the skin 3 (three) times a week. I am unable to make the computer do a 4% gel but please use  02/08/21   Cannady, Henrine Screws T, NP  tiZANidine (ZANAFLEX) 4 MG tablet Take 1 tablet (4 mg total) by mouth every 6 (six) hours as needed for muscle spasms. 08/11/20   Cannady, Henrine Screws T, NP  triamcinolone cream (KENALOG) 0.1 % Apply 1 application topically 2 (two) times daily. 02/08/21   Cannady, Henrine Screws T, NP  zolpidem (AMBIEN) 5 MG tablet Take 1 tablet (5 mg total) by mouth at bedtime as needed for sleep. 02/18/21   Charlynne Cousins, MD    Allergies Penicillins  Family History  Problem Relation Age of Onset   Heart disease Mother    Leukemia Father    Stroke Father    Cerebral aneurysm Sister    Prostate cancer Brother    Prostate cancer Brother    Prostate cancer Brother    Lung cancer Sister    Bladder Cancer Sister    Liver cancer Sister    Brain cancer Sister    Heart attack Sister    Breast cancer Neg Hx     Social History Social History   Tobacco Use   Smoking status: Former    Types: Cigarettes    Quit date: 03/08/1965    Years since quitting: 56.2   Smokeless tobacco: Never  Vaping Use   Vaping Use: Never used  Substance Use Topics   Alcohol use: Yes    Alcohol/week: 5.0 standard drinks    Types: 2 Glasses of wine, 3 Shots of liquor per week   Drug use: No    Review of Systems Constitutional: Negative for fever. Eyes: No visual changes. ENT: Positive for sore throat; negative for difficulty swallowing. Respiratory: Denies shortness of breath. Gastrointestinal: Negative for abdominal pain.  No nausea, no vomiting.  No diarrhea.  Genitourinary: Negative for dysuria.  Negative for decrease in need to void. Musculoskeletal: Positive for generalized body aches. Skin: Negative for rash. Neurological: Positive for headaches, negative for focal weakness or numbness.  ____________________________________________   PHYSICAL EXAM:  VITAL SIGNS: ED Triage Vitals  Enc Vitals Group     BP 05/20/21 1432 (!) 172/88     Pulse Rate 05/20/21 1432 72     Resp 05/20/21 1432 17      Temp 05/20/21 1432 98.9 F (37.2 C)     Temp Source 05/20/21 1432 Oral     SpO2 05/20/21 1718 99 %     Weight 05/20/21 1433 122 lb (55.3 kg)     Height 05/20/21 1433 '5\' 2"'$  (1.575 m)     Head Circumference --      Peak Flow --      Pain Score 05/20/21 1433 8     Pain Loc --      Pain Edu? --      Excl. in [place]? --     Constitutional: Alert and oriented. Well appearing and in no acute distress. Eyes: Conjunctivae are normal.  Head: Atraumatic. Nose: No congestion/rhinnorhea. Mouth/Throat: Mucous membranes are moist.  Oropharynx erythematous,  tonsils without exudate. Uvula is midline. Ears: Right tympanic membrane appears normal.  Left tympanic membrane appears normal. Neck: No stridor. Voice hoarse Lymphatic: Anterior cervical nodes tender Cardiovascular: Normal rate, regular rhythm. Good peripheral circulation. Respiratory: Normal respiratory effort. Lungs CTAB. Gastrointestinal: Soft and nontender. Musculoskeletal: FROM of neck, upper and lower extremities. Neurologic:  Normal speech and language. No gross focal neurologic deficits are appreciated. Skin:  Skin is warm, dry and intact.  No rash noted Psychiatric: Mood and affect are normal. Speech and behavior are normal.  ____________________________________________   LABS (all labs ordered are listed, but only abnormal results are displayed)  Labs Reviewed - No data to display ____________________________________________  EKG  Not indiated ____________________________________________  RADIOLOGY  Not indicated ____________________________________________   PROCEDURES  Procedure(s) performed: None  Critical Care performed: No ____________________________________________   INITIAL IMPRESSION / ASSESSMENT AND PLAN / ED COURSE  75 year old female presenting to the emergency department for treatment and evaluation after COVID test was +3 days ago.  Her only concern today is her sore throat.  See HPI for further  details.  Patient will be treated with viscous lidocaine and Tylenol 3.  She is to follow-up with her primary care provider for symptoms that are not improving over the week.  She is to return to the ER or see primary care sooner for symptoms change or worsen.  Pertinent labs & imaging results that were available during my care of the patient were reviewed by me and considered in my medical decision making (see chart for details). ____________________________________________  Discharge Medication List as of 05/20/2021  4:35 PM     START taking these medications   Details  acetaminophen-codeine (TYLENOL #3) 300-30 MG tablet Take 1 tablet by mouth every 4 (four) hours as needed for moderate pain., Starting Thu 05/20/2021, Normal    lidocaine (XYLOCAINE) 2 % solution Use as directed 15 mLs in the mouth or throat as needed for mouth pain., Starting Thu 05/20/2021, Normal        FINAL CLINICAL IMPRESSION(S) / ED DIAGNOSES  Final diagnoses:  Acute pharyngitis due to other specified organisms  COVID    If controlled substance prescribed during this visit, 12 month history viewed on the [facility] prior to issuing an initial prescription for Schedule II or III opiod.   Note:  This document was prepared using Dragon voice recognition software and may include unintentional dictation errors.    Victorino Dike, FNP 05/20/21 1749    Naaman Plummer, MD 05/20/21 [phone]

## 2021-06-14 ENCOUNTER — Ambulatory Visit: Payer: Self-pay | Admitting: *Deleted

## 2021-06-14 NOTE — Telephone Encounter (Signed)
  Patient believes she has sinus infection secondary to COVID infection and is requesting treatment. Offered appointment- but patient states her husband has her car today and she does not have transportation.  Advised would send message for review of request.  Reason for Disposition  [1] PERSISTING SYMPTOMS OF COVID-19 AND [2] symptoms WORSE  Answer Assessment - Initial Assessment Questions 1. COVID-19 ONSET: "When did the symptoms of COVID-19 first start?"     9/14 2. DIAGNOSIS CONFIRMATION: "How were you diagnosed?" (e.g., COVID-19 oral or nasal viral test; COVID-19 antibody test; doctor visit)     Home test+ 3. MAIN SYMPTOM:  "What is your main concern or symptom right now?" (e.g., breathing difficulty, cough, fatigue. loss of smell)     Nasal drainage- sinus congestion, headache 4. SYMPTOM ONSET: "When did the  nasal congestion  start?"     At the beginning of COVID infection- now lingering head congestion- has changes color from yellow to green/brown 5. BETTER-SAME-WORSE: "Are you getting better, staying the same, or getting worse over the last 1 to 2 weeks?"     Worse- sinus congestion, cough and chest congestion better 6. RECENT MEDICAL VISIT: "Have you been seen by a healthcare provider (doctor, NP, PA) for these persisting COVID-19 symptoms?" If Yes, ask: "When were you seen?" (e.g., date)     No 7. COUGH: "Do you have a cough?" If Yes, ask: "How bad is the cough?"       no 8. FEVER: "Do you have a fever?" If Yes, ask: "What is your temperature, how was it measured, and when did it start?"     no 9. BREATHING DIFFICULTY: "Are you having any trouble breathing?" If Yes, ask: "How bad is your breathing?" (e.g., mild, moderate, severe)    - MILD: No SOB at rest, mild SOB with walking, speaks normally in sentences, can lie down, no retractions, pulse < 100.    - MODERATE: SOB at rest, SOB with minimal exertion and prefers to sit, cannot lie down flat, speaks in phrases, mild retractions,  audible wheezing, pulse 100-120.    - SEVERE: Very SOB at rest, speaks in single words, struggling to breathe, sitting hunched forward, retractions, pulse > 120       No- just congestion in nose 10. HIGH RISK DISEASE: "Do you have any chronic medical problems?" (e.g., asthma, heart or lung disease, weak immune system, obesity, etc.)       Age, hypertension 11. VACCINE: "Have you gotten the COVID-19 vaccine?" If Yes, ask: "Which one, how many shots, when did you get it?"       Yes- Moderna 12. BOOSTER: "Have you received your COVID-19 booster?" If Yes, ask: "Which one and when did you get it?"       no 13. PREGNANCY: "Is there any chance you are pregnant?" "When was your last menstrual period?"       N/a 14. OTHER SYMPTOMS: "Do you have any other symptoms?"  (e.g., fatigue, headache, muscle pain, weakness)       headache 15. O2 SATURATION MONITOR:  "Do you use an oxygen saturation monitor (pulse oximeter) at home?" If Yes, ask "What is your reading (oxygen level) today?" "What is your usual oxygen saturation reading?" (e.g., 95%)       no  Protocols used: Coronavirus (COVID-19) Persisting Symptoms Follow-up Call-[facility]

## 2021-06-14 NOTE — Telephone Encounter (Signed)
Lmom asking pt to call back to schedule an appt. °

## 2021-06-14 NOTE — Telephone Encounter (Signed)
Pt stated she is recovering from Covid but continues to have a headache and she is stopped up. Pt requests call back from a nurse to discuss possible medications to assist with her symptoms. Cb# [phone]   Attempted to call patient- left message in VM

## 2021-06-15 ENCOUNTER — Encounter: Payer: Self-pay | Admitting: Internal Medicine

## 2021-06-15 ENCOUNTER — Ambulatory Visit (INDEPENDENT_AMBULATORY_CARE_PROVIDER_SITE_OTHER): Payer: PPO | Admitting: Internal Medicine

## 2021-06-15 VITALS — BP 145/80

## 2021-06-15 DIAGNOSIS — N1831 Chronic kidney disease, stage 3a: Secondary | ICD-10-CM | POA: Diagnosis not present

## 2021-06-15 DIAGNOSIS — I10 Essential (primary) hypertension: Secondary | ICD-10-CM | POA: Diagnosis not present

## 2021-06-15 MED ORDER — FEXOFENADINE HCL 180 MG PO TABS
180.0000 mg | ORAL_TABLET | Freq: Every day | ORAL | 1 refills | Status: DC
Start: 2021-06-15 — End: 2022-03-15

## 2021-06-15 MED ORDER — AZITHROMYCIN 250 MG PO TABS: ORAL_TABLET | ORAL | 0 refills | Status: AC

## 2021-06-15 NOTE — Telephone Encounter (Signed)
Patient has an appointment today

## 2021-06-15 NOTE — Progress Notes (Signed)
BP (!) 145/80    Subjective:    Patient ID: Lindsey Blair, female    DOB: 1946/06/29, 75 y.o.   MRN: [id number]  Chief Complaint  Patient presents with   sinus pressure    Patient just got over Covid from 05/19/21. Still having issues    Headache    HPI: Lindsey Blair is a 75 y.o. female   This visit was completed via telephone due to the restrictions of the COVID-19 pandemic. All issues as above were discussed and addressed but no physical exam was performed. If it was felt that the patient should be evaluated in the office, they were directed there. The patient verbally consented to this visit. Patient was unable to complete an audio/visual visit due to Technical difficulties. Due to the catastrophic nature of the COVID-19 pandemic, this visit was done through audio contact only. Location of the patient: home Location of the provider: home Those involved with this call:  Provider: Charlynne Cousins, MD CMA: Frazier Butt, [place] Desk/Registration: Myrlene Broker  Time spent on call: 10 minutes on the phone discussing health concerns. 10 minutes total spent in review of patient's record and preparation of their chart.    Headache  This is a new problem. The current episode started 1 to 4 weeks ago (does sinus rinse in the morning, had covid last month took anti virals for covid. no fever and chills now.). Episode frequency: face eyes and nose has pressure. Associated symptoms include drainage and sinus pressure. Pertinent negatives include no anorexia, back pain, blurred vision, hearing loss, insomnia, loss of balance, scalp tenderness, sore throat, tingling, tinnitus, visual change or vomiting.   Chief Complaint  Patient presents with   sinus pressure    Patient just got over Covid from 05/19/21. Still having issues    Headache    Relevant past medical, surgical, family and social history reviewed and updated as indicated. Interim medical history since our last visit  reviewed. Allergies and medications reviewed and updated.  Review of Systems  HENT:  Positive for sinus pressure. Negative for hearing loss, sore throat and tinnitus.   Eyes:  Negative for blurred vision.  Gastrointestinal:  Negative for anorexia and vomiting.  Musculoskeletal:  Negative for back pain.  Neurological:  Positive for headaches. Negative for tingling and loss of balance.  Psychiatric/Behavioral:  The patient does not have insomnia.    Per HPI unless specifically indicated above     Objective:    BP (!) 145/80   Wt Readings from Last 3 Encounters:  05/20/21 122 lb 9.2 oz (55.6 kg)  02/08/21 125 lb 12.8 oz (57.1 kg)  08/11/20 127 lb 12.5 oz (58 kg)    Physical Exam  Results for orders placed or performed in visit on 02/08/21  Comprehensive metabolic panel  Result Value Ref Range   Glucose 74 65 - 99 mg/dL   BUN 10 8 - 27 mg/dL   Creatinine, Ser 1.01 (H) 0.57 - 1.00 mg/dL   eGFR 58 (L) >59 mL/min/1.73   BUN/Creatinine Ratio 10 (L) 12 - 28   Sodium 141 134 - 144 mmol/L   Potassium 4.2 3.5 - 5.2 mmol/L   Chloride 104 96 - 106 mmol/L   CO2 20 20 - 29 mmol/L   Calcium 9.5 8.7 - 10.3 mg/dL   Total Protein 6.6 6.0 - 8.5 g/dL   Albumin 4.3 3.7 - 4.7 g/dL   Globulin, Total 2.3 1.5 - 4.5 g/dL   Albumin/Globulin Ratio 1.9 1.2 -  2.2   Bilirubin Total 0.5 0.0 - 1.2 mg/dL   Alkaline Phosphatase 77 44 - 121 IU/L   AST 26 0 - 40 IU/L   ALT 20 0 - 32 IU/L  CBC with Differential/Platelet  Result Value Ref Range   WBC 4.1 3.4 - 10.8 x10E3/uL   RBC 5.40 (H) 3.77 - 5.28 x10E6/uL   Hemoglobin 14.6 11.1 - 15.9 g/dL   Hematocrit 45.4 34.0 - 46.6 %   MCV 84 79 - 97 fL   MCH 27.0 26.6 - 33.0 pg   MCHC 32.2 31.5 - 35.7 g/dL   RDW 13.6 11.7 - 15.4 %   Platelets 191 150 - 450 x10E3/uL   Neutrophils 54 Not Estab. %   Lymphs 33 Not Estab. %   Monocytes 10 Not Estab. %   Eos 2 Not Estab. %   Basos 1 Not Estab. %   Neutrophils Absolute 2.2 1.4 - 7.0 x10E3/uL   Lymphocytes  Absolute 1.4 0.7 - 3.1 x10E3/uL   Monocytes Absolute 0.4 0.1 - 0.9 x10E3/uL   EOS (ABSOLUTE) 0.1 0.0 - 0.4 x10E3/uL   Basophils Absolute 0.0 0.0 - 0.2 x10E3/uL   Immature Granulocytes 0 Not Estab. %   Immature Grans (Abs) 0.0 0.0 - 0.1 x10E3/uL  Lipid Panel w/o Chol/HDL Ratio  Result Value Ref Range   Cholesterol, Total 251 (H) 100 - 199 mg/dL   Triglycerides 100 0 - 149 mg/dL   HDL 73 >39 mg/dL   VLDL Cholesterol Cal 17 5 - 40 mg/dL   LDL Chol Calc (NIH) 161 (H) 0 - 99 mg/dL  TSH  Result Value Ref Range   TSH 0.850 0.450 - 4.500 uIU/mL  VITAMIN D 25 Hydroxy (Vit-D Deficiency, Fractures)  Result Value Ref Range   Vit D, 25-Hydroxy 43.0 30.0 - 100.0 ng/mL  Testosterone, free, total([facility])  Result Value Ref Range   Testosterone 6 3 - 67 ng/dL   Testosterone, Free 0.3 0.0 - 4.2 pg/mL   Sex Hormone Binding 83.0 17.3 - 125.0 nmol/L        Current Outpatient Medications:    amLODipine-benazepril (LOTREL) 10-20 MG capsule, Take 1 capsule by mouth daily., Disp: 90 capsule, Rfl: 4   atorvastatin (LIPITOR) 20 MG tablet, Take 1 tablet (20 mg total) by mouth daily., Disp: 90 tablet, Rfl: 4   buPROPion (WELLBUTRIN SR) 150 MG 12 hr tablet, Take 1 tablet (150 mg total) by mouth 2 (two) times daily., Disp: 180 tablet, Rfl: 4   estradiol (ESTRACE VAGINAL) 0.1 MG/GM vaginal cream, Place 1 Applicatorful vaginally at bedtime. Place pea-sized amount vaginally at bedtime., Disp: 42.5 g, Rfl: 3   fexofenadine (ALLEGRA ALLERGY) 180 MG tablet, Take 1 tablet (180 mg total) by mouth daily., Disp: 10 tablet, Rfl: 1   hydroxychloroquine (PLAQUENIL) 200 MG tablet, Take 200 mg by mouth daily., Disp: , Rfl:    MELATONIN PO, Take 1 Dose by mouth at bedtime., Disp: , Rfl:    Multiple Vitamin (MULTI-VITAMIN DAILY PO), Take by mouth., Disp: , Rfl:    pantoprazole (PROTONIX) 40 MG tablet, Take 1 tablet (40 mg total) by mouth daily., Disp: 90 tablet, Rfl: 4   Testosterone 1.62 % GEL, Place 1 application  onto the skin 3 (three) times a week. I am unable to make the computer do a 4% gel but please use, Disp: 15 g, Rfl: 2   tiZANidine (ZANAFLEX) 4 MG tablet, Take 1 tablet (4 mg total) by mouth every 6 (six) hours as needed for muscle spasms.,  Disp: 60 tablet, Rfl: 2   triamcinolone cream (KENALOG) 0.1 %, Apply 1 application topically 2 (two) times daily., Disp: 30 g, Rfl: 0   zolpidem (AMBIEN) 5 MG tablet, Take 1 tablet (5 mg total) by mouth at bedtime as needed for sleep., Disp: 30 tablet, Rfl: 0    Assessment & Plan:  Ac sinusitis Start pt on zpak and allegra. pt advised to take Tylenol q 4- 6 hourly as needed. pt to take allegra q pm as needed and to call office if symptoms worsened pt verbalised understanding of such.     2. HTN :  Continue current meds.  Medication compliance emphasised. pt advised to keep Bp logs. Pt verbalised understanding of the same. Pt to have a low salt diet . Exercise to reach a goal of at least 150 mins a week.  lifestyle modifications explained and pt understands importance of the above.       Problem List Items Addressed This Visit       Cardiovascular and Mediastinum   Hypertension - Primary   Relevant Orders   CBC with Differential/Platelet   Comprehensive metabolic panel   Lipid Panel w/o Chol/HDL Ratio   Urinalysis, Routine w reflex microscopic     Genitourinary   CKD (chronic kidney disease) stage 3, GFR 30-59 ml/min (HCC)   Relevant Orders   CBC with Differential/Platelet   Comprehensive metabolic panel   Lipid Panel w/o Chol/HDL Ratio   Urinalysis, Routine w reflex microscopic     Orders Placed This Encounter  Procedures   CBC with Differential/Platelet   Comprehensive metabolic panel   Lipid Panel w/o Chol/HDL Ratio   Urinalysis, Routine w reflex microscopic     Meds ordered this encounter  Medications   azithromycin (ZITHROMAX) 250 MG tablet    Sig: Take 2 tablets on day 1, then 1 tablet daily on days 2 through 5    Dispense:   6 tablet    Refill:  0   fexofenadine (ALLEGRA ALLERGY) 180 MG tablet    Sig: Take 1 tablet (180 mg total) by mouth daily.    Dispense:  10 tablet    Refill:  1     Follow up plan: No follow-ups on file.

## 2021-06-21 ENCOUNTER — Telehealth: Payer: Self-pay | Admitting: Internal Medicine

## 2021-06-21 NOTE — Telephone Encounter (Signed)
Pt is calling because she has orders from her dermatologist. Pt will be coming tomorrow. Can she receive labs for CBC with differential bun, creatine? Please advise [phone]

## 2021-06-21 NOTE — Telephone Encounter (Signed)
These lab have already been ordered by your on her last visit

## 2021-06-22 ENCOUNTER — Other Ambulatory Visit: Payer: PPO

## 2021-06-22 NOTE — Telephone Encounter (Signed)
Patient asks if results can be sent to Dr. Evorn Gong

## 2021-06-22 NOTE — Telephone Encounter (Signed)
Pt called if a nurse would call her back regarding these lab orders.  She has the labs orders that her other doctor wanted ordered.  CB#  [phone]

## 2021-06-25 ENCOUNTER — Other Ambulatory Visit: Payer: PPO

## 2021-06-25 ENCOUNTER — Other Ambulatory Visit: Payer: Self-pay

## 2021-06-25 DIAGNOSIS — N1831 Chronic kidney disease, stage 3a: Secondary | ICD-10-CM | POA: Diagnosis not present

## 2021-06-25 DIAGNOSIS — I10 Essential (primary) hypertension: Secondary | ICD-10-CM | POA: Diagnosis not present

## 2021-06-25 LAB — URINALYSIS, ROUTINE W REFLEX MICROSCOPIC
Bilirubin, UA: NEGATIVE
Glucose, UA: NEGATIVE
Ketones, UA: NEGATIVE
Leukocytes,UA: NEGATIVE
Nitrite, UA: NEGATIVE
RBC, UA: NEGATIVE
Specific Gravity, UA: >1.03 — ABNORMAL HIGH (ref 1.005–1.030)
Urobilinogen, Ur: 0.2 mg/dL (ref 0.2–1.0)
pH, UA: 6 (ref 5.0–7.5)

## 2021-06-26 LAB — COMPREHENSIVE METABOLIC PANEL
ALT: 26 IU/L (ref 0–32)
AST: 33 IU/L (ref 0–40)
Albumin/Globulin Ratio: 1.9 (ref 1.2–2.2)
Albumin: 4.1 g/dL (ref 3.7–4.7)
Alkaline Phosphatase: 67 IU/L (ref 44–121)
BUN/Creatinine Ratio: 12 (ref 12–28)
BUN: 12 mg/dL (ref 8–27)
Bilirubin Total: 0.4 mg/dL (ref 0.0–1.2)
CO2: 23 mmol/L (ref 20–29)
Calcium: 9 mg/dL (ref 8.7–10.3)
Chloride: 106 mmol/L (ref 96–106)
Creatinine, Ser: 0.97 mg/dL (ref 0.57–1.00)
Globulin, Total: 2.2 g/dL (ref 1.5–4.5)
Glucose: 82 mg/dL (ref 70–99)
Potassium: 4.3 mmol/L (ref 3.5–5.2)
Sodium: 142 mmol/L (ref 134–144)
Total Protein: 6.3 g/dL (ref 6.0–8.5)
eGFR: 61 mL/min/{1.73_m2} (ref >59)

## 2021-06-26 LAB — CBC WITH DIFFERENTIAL/PLATELET
Basophils Absolute: 0 10*3/uL (ref 0.0–0.2)
Basos: 1 %
EOS (ABSOLUTE): 0.1 10*3/uL (ref 0.0–0.4)
Eos: 2 %
Hematocrit: 40.9 % (ref 34.0–46.6)
Hemoglobin: 13.1 g/dL (ref 11.1–15.9)
Immature Grans (Abs): 0 10*3/uL (ref 0.0–0.1)
Immature Granulocytes: 0 %
Lymphocytes Absolute: 1.5 10*3/uL (ref 0.7–3.1)
Lymphs: 34 %
MCH: 28.6 pg (ref 26.6–33.0)
MCHC: 32 g/dL (ref 31.5–35.7)
MCV: 89 fL (ref 79–97)
Monocytes Absolute: 0.4 10*3/uL (ref 0.1–0.9)
Monocytes: 9 %
Neutrophils Absolute: 2.4 10*3/uL (ref 1.4–7.0)
Neutrophils: 54 %
Platelets: 181 10*3/uL (ref 150–450)
RBC: 4.58 x10E6/uL (ref 3.77–5.28)
RDW: 12.1 % (ref 11.7–15.4)
WBC: 4.5 10*3/uL (ref 3.4–10.8)

## 2021-06-26 LAB — LIPID PANEL W/O CHOL/HDL RATIO
Cholesterol, Total: 162 mg/dL (ref 100–199)
HDL: 66 mg/dL (ref >39)
LDL Chol Calc (NIH): 79 mg/dL (ref 0–99)
Triglycerides: 94 mg/dL (ref 0–149)
VLDL Cholesterol Cal: 17 mg/dL (ref 5–40)

## 2021-07-14 ENCOUNTER — Encounter: Payer: Self-pay | Admitting: Internal Medicine

## 2021-07-14 ENCOUNTER — Ambulatory Visit (INDEPENDENT_AMBULATORY_CARE_PROVIDER_SITE_OTHER): Payer: PPO | Admitting: Internal Medicine

## 2021-07-14 ENCOUNTER — Other Ambulatory Visit: Payer: Self-pay

## 2021-07-14 VITALS — BP 149/84 | HR 63 | Temp 97.3°F | Wt 128.4 lb

## 2021-07-14 DIAGNOSIS — E782 Mixed hyperlipidemia: Secondary | ICD-10-CM | POA: Diagnosis not present

## 2021-07-14 DIAGNOSIS — R011 Cardiac murmur, unspecified: Secondary | ICD-10-CM

## 2021-07-14 MED ORDER — HYDROCHLOROTHIAZIDE 12.5 MG PO TABS: 12.5000 mg | ORAL_TABLET | Freq: Every day | ORAL | 4 refills | Status: DC

## 2021-07-14 NOTE — Progress Notes (Signed)
BP (!) 149/84   Pulse 63   Temp (!) 97.3 F (36.3 C)   Wt 128 lb 6.4 oz (58.2 kg)   SpO2 98%   BMI 23.48 kg/m    Subjective:    Patient ID: Lindsey Blair, female    DOB: 1946/01/20, 75 y.o.   MRN: [id number]  Chief Complaint  Patient presents with  . follow up labs    HPI: Lindsey Blair is a 75 y.o. female  Heart Problem This is a chronic (has had pedal edema. has a cardiac murmer , no sob , some SOBOE has been raking acorns and raking leaves in her yard.) problem. The current episode started 1 to 4 weeks ago. Pertinent negatives include no abdominal pain, anorexia, change in bowel habit, chest pain, fatigue, fever, headaches, myalgias, nausea, neck pain, rash, sore throat, swollen glands, urinary symptoms or weakness.  Hypertension This is a chronic problem. Associated symptoms include peripheral edema. Pertinent negatives include no chest pain, headaches, malaise/fatigue, neck pain, orthopnea, palpitations, PND or shortness of breath.   Chief Complaint  Patient presents with  . follow up labs    Relevant past medical, surgical, family and social history reviewed and updated as indicated. Interim medical history since our last visit reviewed. Allergies and medications reviewed and updated.  Review of Systems  Constitutional:  Negative for fatigue, fever and malaise/fatigue.  HENT:  Negative for sore throat.   Respiratory:  Negative for shortness of breath.   Cardiovascular:  Negative for chest pain, palpitations, orthopnea and PND.  Gastrointestinal:  Negative for abdominal pain, anorexia, change in bowel habit and nausea.  Musculoskeletal:  Negative for myalgias and neck pain.  Skin:  Negative for rash.  Neurological:  Negative for weakness and headaches.   Per HPI unless specifically indicated above     Objective:    BP (!) 149/84   Pulse 63   Temp (!) 97.3 F (36.3 C)   Wt 128 lb 6.4 oz (58.2 kg)   SpO2 98%   BMI 23.48 kg/m   Wt Readings from Last 3  Encounters:  07/14/21 128 lb 6.4 oz (58.2 kg)  05/20/21 122 lb 9.2 oz (55.6 kg)  02/08/21 125 lb 12.8 oz (57.1 kg)    Physical Exam Vitals and nursing note reviewed.  Constitutional:      General: She is not in acute distress.    Appearance: Normal appearance. She is not ill-appearing or diaphoretic.  Eyes:     Conjunctiva/sclera: Conjunctivae normal.  Cardiovascular:     Heart sounds: Murmur heard.  Pulmonary:     Breath sounds: No rhonchi.  Abdominal:     General: Abdomen is flat. Bowel sounds are normal. There is no distension.     Palpations: Abdomen is soft. There is no mass.     Tenderness: There is no abdominal tenderness. There is no guarding.  Musculoskeletal:        General: No swelling, tenderness, deformity or signs of injury.     Right lower leg: Edema present.     Left lower leg: Edema present.  Skin:    General: Skin is warm and dry.     Coloration: Skin is not jaundiced.     Findings: No erythema.  Neurological:     General: No focal deficit present.     Mental Status: She is alert.     Cranial Nerves: No cranial nerve deficit.     Motor: No weakness.  Psychiatric:  Mood and Affect: Mood normal.        Behavior: Behavior normal.        Thought Content: Thought content normal.    Results for orders placed or performed in visit on 06/25/21  Urinalysis, Routine w reflex microscopic  Result Value Ref Range   Specific Gravity, UA >1.030 (H) 1.005 - 1.030   pH, UA 6.0 5.0 - 7.5   Color, UA Yellow Yellow   Appearance Ur Clear Clear   Leukocytes,UA Negative Negative   Protein,UA Trace (A) Negative/Trace   Glucose, UA Negative Negative   Ketones, UA Negative Negative   RBC, UA Negative Negative   Bilirubin, UA Negative Negative   Urobilinogen, Ur 0.2 0.2 - 1.0 mg/dL   Nitrite, UA Negative Negative  Lipid Panel w/o Chol/HDL Ratio  Result Value Ref Range   Cholesterol, Total 162 100 - 199 mg/dL   Triglycerides 94 0 - 149 mg/dL   HDL 66 >39 mg/dL    VLDL Cholesterol Cal 17 5 - 40 mg/dL   LDL Chol Calc (NIH) 79 0 - 99 mg/dL  Comprehensive metabolic panel  Result Value Ref Range   Glucose 82 70 - 99 mg/dL   BUN 12 8 - 27 mg/dL   Creatinine, Ser 0.97 0.57 - 1.00 mg/dL   eGFR 61 >59 mL/min/1.73   BUN/Creatinine Ratio 12 12 - 28   Sodium 142 134 - 144 mmol/L   Potassium 4.3 3.5 - 5.2 mmol/L   Chloride 106 96 - 106 mmol/L   CO2 23 20 - 29 mmol/L   Calcium 9.0 8.7 - 10.3 mg/dL   Total Protein 6.3 6.0 - 8.5 g/dL   Albumin 4.1 3.7 - 4.7 g/dL   Globulin, Total 2.2 1.5 - 4.5 g/dL   Albumin/Globulin Ratio 1.9 1.2 - 2.2   Bilirubin Total 0.4 0.0 - 1.2 mg/dL   Alkaline Phosphatase 67 44 - 121 IU/L   AST 33 0 - 40 IU/L   ALT 26 0 - 32 IU/L  CBC with Differential/Platelet  Result Value Ref Range   WBC 4.5 3.4 - 10.8 x10E3/uL   RBC 4.58 3.77 - 5.28 x10E6/uL   Hemoglobin 13.1 11.1 - 15.9 g/dL   Hematocrit 40.9 34.0 - 46.6 %   MCV 89 79 - 97 fL   MCH 28.6 26.6 - 33.0 pg   MCHC 32.0 31.5 - 35.7 g/dL   RDW 12.1 11.7 - 15.4 %   Platelets 181 150 - 450 x10E3/uL   Neutrophils 54 Not Estab. %   Lymphs 34 Not Estab. %   Monocytes 9 Not Estab. %   Eos 2 Not Estab. %   Basos 1 Not Estab. %   Neutrophils Absolute 2.4 1.4 - 7.0 x10E3/uL   Lymphocytes Absolute 1.5 0.7 - 3.1 x10E3/uL   Monocytes Absolute 0.4 0.1 - 0.9 x10E3/uL   EOS (ABSOLUTE) 0.1 0.0 - 0.4 x10E3/uL   Basophils Absolute 0.0 0.0 - 0.2 x10E3/uL   Immature Granulocytes 0 Not Estab. %   Immature Grans (Abs) 0.0 0.0 - 0.1 x10E3/uL        Current Outpatient Medications:  .  amLODipine-benazepril (LOTREL) 10-20 MG capsule, Take 1 capsule by mouth daily., Disp: 90 capsule, Rfl: 4 .  atorvastatin (LIPITOR) 20 MG tablet, Take 1 tablet (20 mg total) by mouth daily., Disp: 90 tablet, Rfl: 4 .  buPROPion (WELLBUTRIN SR) 150 MG 12 hr tablet, Take 1 tablet (150 mg total) by mouth 2 (two) times daily., Disp: 180 tablet, Rfl: 4 .  estradiol (ESTRACE VAGINAL) 0.1 MG/GM vaginal cream, Place 1  Applicatorful vaginally at bedtime. Place pea-sized amount vaginally at bedtime., Disp: 42.5 g, Rfl: 3 .  fexofenadine (ALLEGRA ALLERGY) 180 MG tablet, Take 1 tablet (180 mg total) by mouth daily., Disp: 10 tablet, Rfl: 1 .  hydroxychloroquine (PLAQUENIL) 200 MG tablet, Take 200 mg by mouth daily., Disp: , Rfl:  .  MELATONIN PO, Take 1 Dose by mouth at bedtime., Disp: , Rfl:  .  Multiple Vitamin (MULTI-VITAMIN DAILY PO), Take by mouth., Disp: , Rfl:  .  pantoprazole (PROTONIX) 40 MG tablet, Take 1 tablet (40 mg total) by mouth daily., Disp: 90 tablet, Rfl: 4 .  Testosterone 1.62 % GEL, Place 1 application onto the skin 3 (three) times a week. I am unable to make the computer do a 4% gel but please use, Disp: 15 g, Rfl: 2 .  tiZANidine (ZANAFLEX) 4 MG tablet, Take 1 tablet (4 mg total) by mouth every 6 (six) hours as needed for muscle spasms., Disp: 60 tablet, Rfl: 2 .  triamcinolone cream (KENALOG) 0.1 %, Apply 1 application topically 2 (two) times daily., Disp: 30 g, Rfl: 0 .  zolpidem (AMBIEN) 5 MG tablet, Take 1 tablet (5 mg total) by mouth at bedtime as needed for sleep., Disp: 30 tablet, Rfl: 0    Assessment & Plan:  Depression / anxiety  Was on wellbutrin started when she menopaused  Would like to stop Taper to off gradually taking this bid now - start once a day x 1 week then every other day x 1 week then stop.   HLD: on lipitor for such LDL much better.  recheck FLP, check LFT's work on diet, SE of meds explained to pt. low fat and high fiber diet explained to pt.   HTN is on amlodipine/ benazapril for such   Was higher sec to runnning late today Continue current meds.  Medication compliance emphasised. pt advised to keep Bp logs. Pt verbalised understanding of the same. Pt to have a low salt diet . Exercise to reach a goal of at least 150 mins a week.  lifestyle modifications explained and pt understands importance of the above.  Gerd : GERD continue pantoprazole 40 mg q.day as  prescribed patient advised to avoid laying down soon after his meals. He took a 2 hours between dinner and bedtime. Avoid spicy food and triggers that he knows food wise that worsen his acid reflux. Patient verbalized understanding of the above. Lifestyle modifications as above discussed with patient.  Cutaneous SLE ; is on plaquenil for such, is seeing dermatology for such.    CKD improved  GFR improved was seeing dr. Luther Redo , doesn't want to go back as renal function is improved.  Will fu next visit  Ref. Range 02/08/2021 10:41 06/25/2021 08:45  Creatinine Latest Ref Range: 0.57 - 1.00 mg/dL 7.75 (H) 8.84  Calcium Latest Ref Range: 8.7 - 10.3 mg/dL 9.5 9.0  BUN/Creatinine Ratio Latest Ref Range: 12 - 28  10 (L) 12  eGFR Latest Ref Range: >59 mL/min/1.73 58 (L) 61   Cardiac murmer  / pedal edema  Will refer to cards, add hctz for pedal edema.  Take this first thing in the mornign, increase water intake.   Problem List Items Addressed This Visit   None    No orders of the defined types were placed in this encounter.    Meds ordered this encounter  Medications  . hydrochlorothiazide (HYDRODIURIL) 12.5 MG tablet  Sig: Take 1 tablet (12.5 mg total) by mouth daily.    Dispense:  30 tablet    Refill:  4     Follow up plan: No follow-ups on file.

## 2021-08-04 DIAGNOSIS — I10 Essential (primary) hypertension: Secondary | ICD-10-CM | POA: Diagnosis not present

## 2021-08-04 DIAGNOSIS — M329 Systemic lupus erythematosus, unspecified: Secondary | ICD-10-CM | POA: Diagnosis not present

## 2021-08-04 DIAGNOSIS — R011 Cardiac murmur, unspecified: Secondary | ICD-10-CM | POA: Diagnosis not present

## 2021-08-04 DIAGNOSIS — N183 Chronic kidney disease, stage 3 unspecified: Secondary | ICD-10-CM | POA: Diagnosis not present

## 2021-08-04 DIAGNOSIS — E785 Hyperlipidemia, unspecified: Secondary | ICD-10-CM | POA: Diagnosis not present

## 2021-08-04 DIAGNOSIS — Z87891 Personal history of nicotine dependence: Secondary | ICD-10-CM | POA: Diagnosis not present

## 2021-08-11 ENCOUNTER — Ambulatory Visit (INDEPENDENT_AMBULATORY_CARE_PROVIDER_SITE_OTHER): Payer: PPO | Admitting: *Deleted

## 2021-08-11 DIAGNOSIS — Z0000 Encounter for general adult medical examination without abnormal findings: Secondary | ICD-10-CM | POA: Diagnosis not present

## 2021-08-11 NOTE — Patient Instructions (Signed)
Lindsey Blair , Thank you for taking time to come for your Medicare Wellness Visit. I appreciate your ongoing commitment to your health goals. Please review the following plan we discussed and let me know if I can assist you in the future.   Screening recommendations/referrals: Colonoscopy: up to date Mammogram: up to date Bone Density: up to date Recommended yearly ophthalmology/optometry visit for glaucoma screening and checkup Recommended yearly dental visit for hygiene and checkup  Vaccinations: Influenza vaccine: Education provided Pneumococcal vaccine: up to date Tdap vaccine: up to date Shingles vaccine: up to date    Advanced directives: on file  Conditions/risks identified:   Next appointment: 09-13-2021 2 10:40  Vigg   Preventive Care 78 Years and Older, Female Preventive care refers to lifestyle choices and visits with your health care provider that can promote health and wellness. What does preventive care include? A yearly physical exam. This is also called an annual well check. Dental exams once or twice a year. Routine eye exams. Ask your health care provider how often you should have your eyes checked. Personal lifestyle choices, including: Daily care of your teeth and gums. Regular physical activity. Eating a healthy diet. Avoiding tobacco and drug use. Limiting alcohol use. Practicing safe sex. Taking low-dose aspirin every day. Taking vitamin and mineral supplements as recommended by your health care provider. What happens during an annual well check? The services and screenings done by your health care provider during your annual well check will depend on your age, overall health, lifestyle risk factors, and family history of disease. Counseling  Your health care provider may ask you questions about your: Alcohol use. Tobacco use. Drug use. Emotional well-being. Home and relationship well-being. Sexual activity. Eating habits. History of falls. Memory  and ability to understand (cognition). Work and work Statistician. Reproductive health. Screening  You may have the following tests or measurements: Height, weight, and BMI. Blood pressure. Lipid and cholesterol levels. These may be checked every 5 years, or more frequently if you are over 41 years old. Skin check. Lung cancer screening. You may have this screening every year starting at age 105 if you have a 30-pack-year history of smoking and currently smoke or have quit within the past 15 years. Fecal occult blood test (FOBT) of the stool. You may have this test every year starting at age 84. Flexible sigmoidoscopy or colonoscopy. You may have a sigmoidoscopy every 5 years or a colonoscopy every 10 years starting at age 81. Hepatitis C blood test. Hepatitis B blood test. Sexually transmitted disease (STD) testing. Diabetes screening. This is done by checking your blood sugar (glucose) after you have not eaten for a while (fasting). You may have this done every 1-3 years. Bone density scan. This is done to screen for osteoporosis. You may have this done starting at age 59. Mammogram. This may be done every 1-2 years. Talk to your health care provider about how often you should have regular mammograms. Talk with your health care provider about your test results, treatment options, and if necessary, the need for more tests. Vaccines  Your health care provider may recommend certain vaccines, such as: Influenza vaccine. This is recommended every year. Tetanus, diphtheria, and acellular pertussis (Tdap, Td) vaccine. You may need a Td booster every 10 years. Zoster vaccine. You may need this after age 67. Pneumococcal 13-valent conjugate (PCV13) vaccine. One dose is recommended after age 93. Pneumococcal polysaccharide (PPSV23) vaccine. One dose is recommended after age 69. Talk to your health care  provider about which screenings and vaccines you need and how often you need them. This  information is not intended to replace advice given to you by your health care provider. Make sure you discuss any questions you have with your health care provider. Document Released: 09/18/2015 Document Revised: 05/11/2016 Document Reviewed: 06/23/2015 Elsevier Interactive Patient Education  2017 [place] Prevention in the Home Falls can cause injuries. They can happen to people of all ages. There are many things you can do to make your home safe and to help prevent falls. What can I do on the outside of my home? Regularly fix the edges of walkways and driveways and fix any cracks. Remove anything that might make you trip as you walk through a door, such as a raised step or threshold. Trim any bushes or trees on the path to your home. Use bright outdoor lighting. Clear any walking paths of anything that might make someone trip, such as rocks or tools. Regularly check to see if handrails are loose or broken. Make sure that both sides of any steps have handrails. Any raised decks and porches should have guardrails on the edges. Have any leaves, snow, or ice cleared regularly. Use sand or salt on walking paths during winter. Clean up any spills in your garage right away. This includes oil or grease spills. What can I do in the bathroom? Use night lights. Install grab bars by the toilet and in the tub and shower. Do not use towel bars as grab bars. Use non-skid mats or decals in the tub or shower. If you need to sit down in the shower, use a plastic, non-slip stool. Keep the floor dry. Clean up any water that spills on the floor as soon as it happens. Remove soap buildup in the tub or shower regularly. Attach bath mats securely with double-sided non-slip rug tape. Do not have throw rugs and other things on the floor that can make you trip. What can I do in the bedroom? Use night lights. Make sure that you have a light by your bed that is easy to reach. Do not use any sheets or  blankets that are too big for your bed. They should not hang down onto the floor. Have a firm chair that has side arms. You can use this for support while you get dressed. Do not have throw rugs and other things on the floor that can make you trip. What can I do in the kitchen? Clean up any spills right away. Avoid walking on wet floors. Keep items that you use a lot in easy-to-reach places. If you need to reach something above you, use a strong step stool that has a grab bar. Keep electrical cords out of the way. Do not use floor polish or wax that makes floors slippery. If you must use wax, use non-skid floor wax. Do not have throw rugs and other things on the floor that can make you trip. What can I do with my stairs? Do not leave any items on the stairs. Make sure that there are handrails on both sides of the stairs and use them. Fix handrails that are broken or loose. Make sure that handrails are as long as the stairways. Check any carpeting to make sure that it is firmly attached to the stairs. Fix any carpet that is loose or worn. Avoid having throw rugs at the top or bottom of the stairs. If you do have throw rugs, attach them to the  floor with carpet tape. Make sure that you have a light switch at the top of the stairs and the bottom of the stairs. If you do not have them, ask someone to add them for you. What else can I do to help prevent falls? Wear shoes that: Do not have high heels. Have rubber bottoms. Are comfortable and fit you well. Are closed at the toe. Do not wear sandals. If you use a stepladder: Make sure that it is fully opened. Do not climb a closed stepladder. Make sure that both sides of the stepladder are locked into place. Ask someone to hold it for you, if possible. Clearly mark and make sure that you can see: Any grab bars or handrails. First and last steps. Where the edge of each step is. Use tools that help you move around (mobility aids) if they are  needed. These include: Canes. Walkers. Scooters. Crutches. Turn on the lights when you go into a dark area. Replace any light bulbs as soon as they burn out. Set up your furniture so you have a clear path. Avoid moving your furniture around. If any of your floors are uneven, fix them. If there are any pets around you, be aware of where they are. Review your medicines with your doctor. Some medicines can make you feel dizzy. This can increase your chance of falling. Ask your doctor what other things that you can do to help prevent falls. This information is not intended to replace advice given to you by your health care provider. Make sure you discuss any questions you have with your health care provider. Document Released: 06/18/2009 Document Revised: 01/28/2016 Document Reviewed: 09/26/2014 Elsevier Interactive Patient Education  2017 [employer].

## 2021-08-11 NOTE — Progress Notes (Signed)
Subjective:   Lindsey Blair is a 75 y.o. female who presents for Medicare Annual (Subsequent) preventive examination.  I connected with  Lindsey Blair on 08/11/21 by a telephone enabled telemedicine application and verified that I am speaking with the correct person using two identifiers.   I discussed the limitations of evaluation and management by telemedicine. The patient expressed understanding and agreed to proceed.  Patient location: home  Provider location: Tele-health not in office   Review of Systems     Cardiac Risk Factors include: advanced age (>50men, >58 women);hypertension     Objective:    Today's Vitals   There is no height or weight on file to calculate BMI.  Advanced Directives 08/11/2021 05/20/2021 08/10/2020 11/01/2018 02/17/2017 10/22/2015  Does Patient Have a Medical Advance Directive? Yes Yes Yes Yes No No  Type of Paramedic of [place];Living will [place];Living will [place];Living will - -  Copy of [place] in Chart? Yes - validated most recent copy scanned in chart (See row information) - No - copy requested No - copy requested - -  Would patient like information on creating a medical advance directive? - No - Patient declined - - Yes (MAU/Ambulatory/Procedural Areas - Information given) No - patient declined information    Current Medications (verified) Outpatient Encounter Medications as of 08/11/2021  Medication Sig   amLODipine-benazepril (LOTREL) 10-20 MG capsule Take 1 capsule by mouth daily.   atorvastatin (LIPITOR) 20 MG tablet Take 1 tablet (20 mg total) by mouth daily.   buPROPion (WELLBUTRIN SR) 150 MG 12 hr tablet Take 1 tablet (150 mg total) by mouth 2 (two) times daily.   estradiol (ESTRACE VAGINAL) 0.1 MG/GM vaginal cream Place 1 Applicatorful vaginally at bedtime. Place pea-sized amount vaginally at bedtime.   fexofenadine  (ALLEGRA ALLERGY) 180 MG tablet Take 1 tablet (180 mg total) by mouth daily.   hydrochlorothiazide (HYDRODIURIL) 12.5 MG tablet Take 1 tablet (12.5 mg total) by mouth daily.   hydroxychloroquine (PLAQUENIL) 200 MG tablet Take 200 mg by mouth daily.   MELATONIN PO Take 1 Dose by mouth at bedtime.   Multiple Vitamin (MULTI-VITAMIN DAILY PO) Take by mouth.   pantoprazole (PROTONIX) 40 MG tablet Take 1 tablet (40 mg total) by mouth daily.   Testosterone 1.62 % GEL Place 1 application onto the skin 3 (three) times a week. I am unable to make the computer do a 4% gel but please use   tiZANidine (ZANAFLEX) 4 MG tablet Take 1 tablet (4 mg total) by mouth every 6 (six) hours as needed for muscle spasms.   triamcinolone cream (KENALOG) 0.1 % Apply 1 application topically 2 (two) times daily.   zolpidem (AMBIEN) 5 MG tablet Take 1 tablet (5 mg total) by mouth at bedtime as needed for sleep.   No facility-administered encounter medications on file as of 08/11/2021.    Allergies (verified) Penicillins   History: Past Medical History:  Diagnosis Date   Anxiety    Cancer ([place]) Several instances of the years   Skin   Chronic kidney disease    Cutaneous lupus erythematosus    Depression    Hyperlipidemia    Hypertension    Vertigo    rare   Past Surgical History:  Procedure Laterality Date   BASAL CELL CARCINOMA EXCISION     BREAST BIOPSY Right 2000   Negative   CATARACT EXTRACTION Right 08/05/2019  COLONOSCOPY WITH PROPOFOL N/A 10/22/2015   Procedure: COLONOSCOPY WITH PROPOFOL;  Surgeon: Lucilla Lame, MD;  Location: [facility];  Service: Endoscopy;  Laterality: N/A;   ESOPHAGOGASTRODUODENOSCOPY (EGD) WITH PROPOFOL N/A 10/22/2015   Procedure: ESOPHAGOGASTRODUODENOSCOPY (EGD) WITH PROPOFOL;  Surgeon: Lucilla Lame, MD;  Location: [facility];  Service: Endoscopy;  Laterality: N/A;   IMAGE GUIDED SINUS SURGERY  04/06/11   [facility], Dr. Pryor Ochoa   MASTOIDECTOMY     Family History   Problem Relation Age of Onset   Heart disease Mother    Leukemia Father    Stroke Father    Cerebral aneurysm Sister    Prostate cancer Brother    Prostate cancer Brother    Prostate cancer Brother    Lung cancer Sister    Bladder Cancer Sister    Liver cancer Sister    Brain cancer Sister    Heart attack Sister    Breast cancer Neg Hx    Social History   Socioeconomic History   Marital status: Married    Spouse name: Not on file   Number of children: Not on file   Years of education: Not on file   Highest education level: Some college, no degree  Occupational History   Occupation: retired   Tobacco Use   Smoking status: Former    Types: Cigarettes    Quit date: 03/08/1965    Years since quitting: 56.4   Smokeless tobacco: Never  Vaping Use   Vaping Use: Never used  Substance and Sexual Activity   Alcohol use: Yes    Alcohol/week: 5.0 standard drinks    Types: 2 Glasses of wine, 3 Shots of liquor per week   Drug use: No   Sexual activity: Yes  Other Topics Concern   Not on file  Social History Narrative   Not on file   Social Determinants of Health   Financial Resource Strain: Low Risk    Difficulty of Paying Living Expenses: Not hard at all  Food Insecurity: No Food Insecurity   Worried About Charity fundraiser in the Last Year: Never true   [place] in the Last Year: Never true  Transportation Needs: No Transportation Needs   Lack of Transportation (Medical): No   Lack of Transportation (Non-Medical): No  Physical Activity: Inactive   Days of Exercise per Week: 0 days   Minutes of Exercise per Session: 0 min  Stress: No Stress Concern Present   Feeling of Stress : Not at all  Social Connections: Socially Integrated   Frequency of Communication with Friends and Family: More than three times a week   Frequency of Social Gatherings with Friends and Family: More than three times a week   Attends Religious Services: More than 4 times per year    Active Member of [employer] or Organizations: No   Attends Music therapist: 1 to 4 times per year   Marital Status: Married    Tobacco Counseling Counseling given: Not Answered   Clinical Intake:  Pre-visit preparation completed: Yes  Pain : No/denies pain     Nutritional Risks: None Diabetes: No  How often do you need to have someone help you when you read instructions, pamphlets, or other written materials from your doctor or pharmacy?: 1 - Never  Diabetic?  no  Interpreter Needed?: No  Information entered by :: Leroy Kennedy LPN   Activities of Daily Living In your present state of health, do you have any difficulty performing  the following activities: 08/11/2021 08/11/2020  Hearing? Tempie Donning  Vision? N Y  Difficulty concentrating or making decisions? N N  Walking or climbing stairs? N N  Dressing or bathing? N N  Doing errands, shopping? N N  Preparing Food and eating ? N -  Using the Toilet? N -  In the past six months, have you accidently leaked urine? N -  Do you have problems with loss of bowel control? N -  Managing your Medications? N -  Managing your Finances? N -  Housekeeping or managing your Housekeeping? N -  Some recent data might be hidden    Patient Care Team: Charlynne Cousins, MD as PCP - General Lucilla Lame, MD as Consulting Physician (Gastroenterology) Lavonia Dana, MD as Consulting Physician (Internal Medicine) Emmaline Kluver., MD (Rheumatology)  Indicate any recent Medical Services you may have received from other than [facility] providers in the past year (date may be approximate).     Assessment:   This is a routine wellness examination for Malaijah.  Hearing/Vision screen Hearing Screening - Comments:: A little trouble hearing Vision Screening - Comments:: Vision Up to date   Dietary issues and exercise activities discussed: Current Exercise Habits: The patient does not participate in regular exercise at present, Exercise  limited by: None identified   Goals Addressed             This Visit's Progress    Increase water intake   On track    Recommend drinking at least 3-4 glasses of water a day      Patient Stated       Continue current lifestyle        Depression Screen PHQ 2/9 Scores 08/11/2021 06/15/2021 02/08/2021 08/11/2020 08/10/2020 02/10/2020 08/12/2019  PHQ - 2 Score 0 0 0 0 0 0 0  PHQ- 9 Score 0 0 1 1 1 2 1     Fall Risk Fall Risk  08/11/2021 06/15/2021 08/11/2020 08/10/2020 08/12/2019  Falls in the past year? 0 0 0 0 0  Number falls in past yr: 0 0 0 - 0  Injury with Fall? 0 0 0 - 0  Risk for fall due to : - No Fall Risks - Medication side effect -  Follow up Falls evaluation completed;Falls prevention discussed Falls evaluation completed - Falls evaluation completed;Education provided;Falls prevention discussed Falls evaluation completed    FALL RISK PREVENTION PERTAINING TO THE HOME:  Any stairs in or around the home? Yes  If so, are there any without handrails? No  Home free of loose throw rugs in walkways, pet beds, electrical cords, etc? Yes  Adequate lighting in your home to reduce risk of falls? Yes   ASSISTIVE DEVICES UTILIZED TO PREVENT FALLS:  Life alert? No  Use of a cane, walker or w/c? No  Grab bars in the bathroom? No  Shower chair or bench in shower? No  Elevated toilet seat or a handicapped toilet? Yes   TIMED UP AND GO:  Was the test performed? No .    Cognitive Function:  Normal cognitive status assessed by direct observation by this Nurse Health Advisor. No abnormalities found.       6CIT Screen 08/10/2020 02/17/2017  What Year? 0 points 0 points  What month? 0 points 0 points  What time? 0 points 0 points  Count back from 20 2 points 0 points  Months in reverse 0 points 0 points  Repeat phrase 2 points 0 points  Total Score 4 0  Immunizations Immunization History  Administered Date(s) Administered   Influenza, High Dose Seasonal PF 05/14/2018    Influenza,inj,quad, With Preservative 06/17/2019   Influenza-Unspecified 06/11/2015, 06/24/2016, 06/08/2017, 06/11/2020   Moderna Sars-Covid-2 Vaccination 10/22/2019, 11/19/2019, 06/11/2020   Pneumococcal Conjugate-13 04/09/2014   Pneumococcal-Unspecified 10/03/2011   Td 08/25/2005, 10/08/2015   Zoster Recombinat (Shingrix) 11/08/2018, 02/25/2019   Zoster, Live 08/27/2008    TDAP status: Up to date  Flu Vaccine status: Due, Education has been provided regarding the importance of this vaccine. Advised may receive this vaccine at local pharmacy or Health Dept. Aware to provide a copy of the vaccination record if obtained from local pharmacy or Health Dept. Verbalized acceptance and understanding.  Pneumococcal vaccine status: Up to date  Covid-19 vaccine status: Completed vaccines  Qualifies for Shingles Vaccine? Yes   Zostavax completed Yes   Shingrix Completed?: Yes  Screening Tests Health Maintenance  Topic Date Due   Pneumonia Vaccine 26+ Years old (2 - PPSV23 if available, else PCV20) 04/10/2015   COVID-19 Vaccine (4 - Booster for Moderna series) 08/06/2020   INFLUENZA VACCINE  04/05/2021   TETANUS/TDAP  10/07/2025   DEXA SCAN  Completed   Hepatitis C Screening  Completed   Zoster Vaccines- Shingrix  Completed   HPV VACCINES  Aged Out   COLONOSCOPY (Pts 45-93yrs Insurance coverage will need to be confirmed)  Discontinued    Health Maintenance  Health Maintenance Due  Topic Date Due   Pneumonia Vaccine 47+ Years old (2 - PPSV23 if available, else PCV20) 04/10/2015   COVID-19 Vaccine (4 - Booster for Moderna series) 08/06/2020   INFLUENZA VACCINE  04/05/2021    Colorectal cancer screening: Type of screening: Colonoscopy. Completed 2017. Repeat every 10 years  Mammogram status: Completed 2022. Repeat every year  Bone Density status: Completed  . Results reflect: Bone density results: OSTEOPENIA. Repeat every 3 years.  Lung Cancer Screening: (Low Dose CT Chest  recommended if Age 60-80 years, 30 pack-year currently smoking OR have quit w/in 15years.) does not qualify.   Lung Cancer Screening Referral:   Additional Screening:  Hepatitis C Screening: does not qualify; Completed 2017  Vision Screening: Recommended annual ophthalmology exams for early detection of glaucoma and other disorders of the eye. Is the patient up to date with their annual eye exam?  Yes  Who is the provider or what is the name of the office in which the patient attends annual eye exams? [facility] If pt is not established with a provider, would they like to be referred to a provider to establish care? No .   Dental Screening: Recommended annual dental exams for proper oral hygiene  Community Resource Referral / Chronic Care Management: CRR required this visit?  No   CCM required this visit?  No      Plan:     I have personally reviewed and noted the following in the patient's chart:   Medical and social history Use of alcohol, tobacco or illicit drugs  Current medications and supplements including opioid prescriptions.  Functional ability and status Nutritional status Physical activity Advanced directives List of other physicians Hospitalizations, surgeries, and ER visits in previous 12 months Vitals Screenings to include cognitive, depression, and falls Referrals and appointments  In addition, I have reviewed and discussed with patient certain preventive protocols, quality metrics, and best practice recommendations. A written personalized care plan for preventive services as well as general preventive health recommendations were provided to patient.     Leroy Kennedy, LPN   14/12/3152  Nurse Notes:

## 2021-08-13 ENCOUNTER — Encounter: Payer: Self-pay | Admitting: Nurse Practitioner

## 2021-08-24 DIAGNOSIS — R011 Cardiac murmur, unspecified: Secondary | ICD-10-CM | POA: Diagnosis not present

## 2021-09-02 ENCOUNTER — Other Ambulatory Visit: Payer: Self-pay | Admitting: Internal Medicine

## 2021-09-02 ENCOUNTER — Other Ambulatory Visit: Payer: Self-pay | Admitting: Nurse Practitioner

## 2021-09-02 DIAGNOSIS — N951 Menopausal and female climacteric states: Secondary | ICD-10-CM

## 2021-09-02 NOTE — Telephone Encounter (Signed)
Medication Refill - Medication:  estradiol (ESTRACE VAGINAL) 0.1 MG/GM vaginal cream    Has the patient contacted their pharmacy? Yes.   Contact PCP, provider who originally filled is not longer in office  Preferred Pharmacy (with phone number or street name):  [facility], [place]  Phone:  [phone] Fax:  [phone]     Has the patient been seen for an appointment in the last year OR does the patient have an upcoming appointment? Yes.    Agent: Please be advised that RX refills may take up to 3 business days. We ask that you follow-up with your pharmacy.

## 2021-09-02 NOTE — Telephone Encounter (Signed)
Requested medication (s) are due for refill today: expired medication  Requested medication (s) are on the active medication list: yes  Last refill:  10/03/19 42.5 g 3 refills  Future visit scheduled: 1 week  Notes to clinic:  expired medication. Do you want to refill Rx?     Requested Prescriptions  Pending Prescriptions Disp Refills   estradiol (ESTRACE VAGINAL) 0.1 MG/GM vaginal cream 42.5 g 3    Sig: Place 1 Applicatorful vaginally at bedtime. Place pea-sized amount vaginally at bedtime.     OB/GYN:  Estrogens Failed - 09/02/2021  1:41 PM      Failed - Mammogram is up-to-date per Health Maintenance      Failed - Last BP in normal range    BP Readings from Last 1 Encounters:  07/14/21 (!) 149/84          Passed - Valid encounter within last 12 months    Recent Outpatient Visits           1 month ago Cardiac murmur   [place] Vigg, Avanti, MD   2 months ago Primary hypertension   [place] Vigg, Avanti, MD   3 months ago COVID-19   [place], Santiago Glad, NP   6 months ago Systemic lupus erythematosus, unspecified SLE type, unspecified organ involvement status ([place])   [place], Barbaraann Faster, NP   1 year ago Stage 3a chronic kidney disease ([place])   [place], Barbaraann Faster, NP       Future Appointments             In 1 week Vigg, Avanti, MD [facility]

## 2021-09-07 ENCOUNTER — Other Ambulatory Visit: Payer: PPO

## 2021-09-08 ENCOUNTER — Other Ambulatory Visit: Payer: Self-pay

## 2021-09-08 ENCOUNTER — Other Ambulatory Visit: Payer: PPO

## 2021-09-08 DIAGNOSIS — R011 Cardiac murmur, unspecified: Secondary | ICD-10-CM | POA: Diagnosis not present

## 2021-09-08 DIAGNOSIS — E782 Mixed hyperlipidemia: Secondary | ICD-10-CM

## 2021-09-09 LAB — COMPREHENSIVE METABOLIC PANEL
ALT: 23 IU/L (ref 0–32)
AST: 26 IU/L (ref 0–40)
Albumin/Globulin Ratio: 2 (ref 1.2–2.2)
Albumin: 4.5 g/dL (ref 3.7–4.7)
Alkaline Phosphatase: 77 IU/L (ref 44–121)
BUN/Creatinine Ratio: 13 (ref 12–28)
BUN: 17 mg/dL (ref 8–27)
Bilirubin Total: 0.4 mg/dL (ref 0.0–1.2)
CO2: 25 mmol/L (ref 20–29)
Calcium: 9.3 mg/dL (ref 8.7–10.3)
Chloride: 102 mmol/L (ref 96–106)
Creatinine, Ser: 1.34 mg/dL — ABNORMAL HIGH (ref 0.57–1.00)
Globulin, Total: 2.3 g/dL (ref 1.5–4.5)
Glucose: 125 mg/dL — ABNORMAL HIGH (ref 70–99)
Potassium: 4 mmol/L (ref 3.5–5.2)
Sodium: 138 mmol/L (ref 134–144)
Total Protein: 6.8 g/dL (ref 6.0–8.5)
eGFR: 41 mL/min/{1.73_m2} — ABNORMAL LOW (ref >59)

## 2021-09-09 LAB — LIPID PANEL
Chol/HDL Ratio: 2.8 ratio (ref 0.0–4.4)
Cholesterol, Total: 183 mg/dL (ref 100–199)
HDL: 66 mg/dL (ref >39)
LDL Chol Calc (NIH): 95 mg/dL (ref 0–99)
Triglycerides: 126 mg/dL (ref 0–149)
VLDL Cholesterol Cal: 22 mg/dL (ref 5–40)

## 2021-09-09 LAB — CBC WITH DIFFERENTIAL/PLATELET
Basophils Absolute: 0 10*3/uL (ref 0.0–0.2)
Basos: 1 %
EOS (ABSOLUTE): 0.1 10*3/uL (ref 0.0–0.4)
Eos: 1 %
Hematocrit: 43.9 % (ref 34.0–46.6)
Hemoglobin: 14.6 g/dL (ref 11.1–15.9)
Immature Grans (Abs): 0 10*3/uL (ref 0.0–0.1)
Immature Granulocytes: 0 %
Lymphocytes Absolute: 1.6 10*3/uL (ref 0.7–3.1)
Lymphs: 28 %
MCH: 28.5 pg (ref 26.6–33.0)
MCHC: 33.3 g/dL (ref 31.5–35.7)
MCV: 86 fL (ref 79–97)
Monocytes Absolute: 0.4 10*3/uL (ref 0.1–0.9)
Monocytes: 7 %
Neutrophils Absolute: 3.5 10*3/uL (ref 1.4–7.0)
Neutrophils: 63 %
Platelets: 201 10*3/uL (ref 150–450)
RBC: 5.12 x10E6/uL (ref 3.77–5.28)
RDW: 11.7 % (ref 11.7–15.4)
WBC: 5.5 10*3/uL (ref 3.4–10.8)

## 2021-09-13 ENCOUNTER — Ambulatory Visit: Payer: PPO | Admitting: Internal Medicine

## 2021-09-13 DIAGNOSIS — E785 Hyperlipidemia, unspecified: Secondary | ICD-10-CM | POA: Diagnosis not present

## 2021-09-13 DIAGNOSIS — M329 Systemic lupus erythematosus, unspecified: Secondary | ICD-10-CM | POA: Diagnosis not present

## 2021-09-13 DIAGNOSIS — R011 Cardiac murmur, unspecified: Secondary | ICD-10-CM | POA: Diagnosis not present

## 2021-09-13 DIAGNOSIS — I10 Essential (primary) hypertension: Secondary | ICD-10-CM | POA: Diagnosis not present

## 2021-09-13 DIAGNOSIS — N183 Chronic kidney disease, stage 3 unspecified: Secondary | ICD-10-CM | POA: Diagnosis not present

## 2021-09-13 DIAGNOSIS — Z87891 Personal history of nicotine dependence: Secondary | ICD-10-CM | POA: Diagnosis not present

## 2021-09-14 ENCOUNTER — Other Ambulatory Visit: Payer: Self-pay

## 2021-09-14 ENCOUNTER — Encounter: Payer: Self-pay | Admitting: Internal Medicine

## 2021-09-14 ENCOUNTER — Ambulatory Visit (INDEPENDENT_AMBULATORY_CARE_PROVIDER_SITE_OTHER): Payer: PPO | Admitting: Internal Medicine

## 2021-09-14 VITALS — BP 124/75 | HR 70 | Temp 98.1°F | Ht 62.6 in | Wt 133.0 lb

## 2021-09-14 DIAGNOSIS — N951 Menopausal and female climacteric states: Secondary | ICD-10-CM | POA: Diagnosis not present

## 2021-09-14 DIAGNOSIS — G4700 Insomnia, unspecified: Secondary | ICD-10-CM | POA: Diagnosis not present

## 2021-09-14 DIAGNOSIS — R011 Cardiac murmur, unspecified: Secondary | ICD-10-CM | POA: Diagnosis not present

## 2021-09-14 DIAGNOSIS — Z23 Encounter for immunization: Secondary | ICD-10-CM | POA: Diagnosis not present

## 2021-09-14 MED ORDER — ESZOPICLONE 1 MG PO TABS: 1.0000 mg | ORAL_TABLET | Freq: Every evening | ORAL | 1 refills | Status: DC | PRN

## 2021-09-14 MED ORDER — ESTRADIOL 0.1 MG/GM VA CREA: 1.0000 | TOPICAL_CREAM | Freq: Every day | VAGINAL | 1 refills | Status: DC

## 2021-09-14 NOTE — Progress Notes (Signed)
BP 124/75    Pulse 70    Temp 98.1 F (36.7 C) (Oral)    Ht 5' 2.6" (1.59 m)    Wt 133 lb (60.3 kg)    SpO2 96%    BMI 23.86 kg/m    Subjective:    Patient ID: Lindsey Blair, female    DOB: 1945-10-02, 76 y.o.   MRN: [id number]  Chief Complaint  Patient presents with   Hypertension   Heart Murmur    HPI: Lindsey Blair is a 76 y.o. female  Patient presents with: Hypertension Heart Murmur    Insomnia Primary symptoms: no fragmented sleep, difficulty falling asleep, frequent awakening, no premature morning awakening.    Heart Problem This is a chronic (cardiac murmer noted seen cards) problem.   Chief Complaint  Patient presents with   Hypertension   Heart Murmur    Relevant past medical, surgical, family and social history reviewed and updated as indicated. Interim medical history since our last visit reviewed. Allergies and medications reviewed and updated.  Review of Systems  Psychiatric/Behavioral:  The patient has insomnia.    Per HPI unless specifically indicated above     Objective:    BP 124/75    Pulse 70    Temp 98.1 F (36.7 C) (Oral)    Ht 5' 2.6" (1.59 m)    Wt 133 lb (60.3 kg)    SpO2 96%    BMI 23.86 kg/m   Wt Readings from Last 3 Encounters:  09/14/21 133 lb (60.3 kg)  07/14/21 128 lb 6.4 oz (58.2 kg)  05/20/21 122 lb 9.2 oz (55.6 kg)    Physical Exam Vitals and nursing note reviewed.  Constitutional:      General: She is not in acute distress.    Appearance: Normal appearance. She is not ill-appearing or diaphoretic.  Eyes:     Conjunctiva/sclera: Conjunctivae normal.  Cardiovascular:     Rate and Rhythm: Normal rate and regular rhythm.     Heart sounds: No murmur heard. Pulmonary:     Effort: No respiratory distress.     Breath sounds: No stridor. No wheezing or rhonchi.  Abdominal:     General: Abdomen is flat. There is no distension.     Palpations: There is no mass.     Tenderness: There is no abdominal tenderness. There is  no guarding.  Musculoskeletal:        General: No swelling, tenderness or deformity.  Skin:    General: Skin is warm and dry.     Coloration: Skin is not jaundiced.     Findings: No erythema.  Neurological:     Mental Status: She is alert.    Results for orders placed or performed in visit on 09/08/21  CBC with Differential/Platelet  Result Value Ref Range   WBC 5.5 3.4 - 10.8 x10E3/uL   RBC 5.12 3.77 - 5.28 x10E6/uL   Hemoglobin 14.6 11.1 - 15.9 g/dL   Hematocrit 43.9 34.0 - 46.6 %   MCV 86 79 - 97 fL   MCH 28.5 26.6 - 33.0 pg   MCHC 33.3 31.5 - 35.7 g/dL   RDW 11.7 11.7 - 15.4 %   Platelets 201 150 - 450 x10E3/uL   Neutrophils 63 Not Estab. %   Lymphs 28 Not Estab. %   Monocytes 7 Not Estab. %   Eos 1 Not Estab. %   Basos 1 Not Estab. %   Neutrophils Absolute 3.5 1.4 - 7.0 x10E3/uL  Lymphocytes Absolute 1.6 0.7 - 3.1 x10E3/uL   Monocytes Absolute 0.4 0.1 - 0.9 x10E3/uL   EOS (ABSOLUTE) 0.1 0.0 - 0.4 x10E3/uL   Basophils Absolute 0.0 0.0 - 0.2 x10E3/uL   Immature Granulocytes 0 Not Estab. %   Immature Grans (Abs) 0.0 0.0 - 0.1 x10E3/uL  Lipid panel  Result Value Ref Range   Cholesterol, Total 183 100 - 199 mg/dL   Triglycerides 126 0 - 149 mg/dL   HDL 66 >39 mg/dL   VLDL Cholesterol Cal 22 5 - 40 mg/dL   LDL Chol Calc (NIH) 95 0 - 99 mg/dL   Chol/HDL Ratio 2.8 0.0 - 4.4 ratio  Comprehensive metabolic panel  Result Value Ref Range   Glucose 125 (H) 70 - 99 mg/dL   BUN 17 8 - 27 mg/dL   Creatinine, Ser 1.34 (H) 0.57 - 1.00 mg/dL   eGFR 41 (L) >59 mL/min/1.73   BUN/Creatinine Ratio 13 12 - 28   Sodium 138 134 - 144 mmol/L   Potassium 4.0 3.5 - 5.2 mmol/L   Chloride 102 96 - 106 mmol/L   CO2 25 20 - 29 mmol/L   Calcium 9.3 8.7 - 10.3 mg/dL   Total Protein 6.8 6.0 - 8.5 g/dL   Albumin 4.5 3.7 - 4.7 g/dL   Globulin, Total 2.3 1.5 - 4.5 g/dL   Albumin/Globulin Ratio 2.0 1.2 - 2.2   Bilirubin Total 0.4 0.0 - 1.2 mg/dL   Alkaline Phosphatase 77 44 - 121 IU/L    AST 26 0 - 40 IU/L   ALT 23 0 - 32 IU/L        Current Outpatient Medications:    amLODipine-benazepril (LOTREL) 10-20 MG capsule, Take 1 capsule by mouth daily., Disp: 90 capsule, Rfl: 4   atorvastatin (LIPITOR) 20 MG tablet, Take 1 tablet (20 mg total) by mouth daily., Disp: 90 tablet, Rfl: 4   fexofenadine (ALLEGRA ALLERGY) 180 MG tablet, Take 1 tablet (180 mg total) by mouth daily., Disp: 10 tablet, Rfl: 1   hydrochlorothiazide (HYDRODIURIL) 12.5 MG tablet, Take 1 tablet (12.5 mg total) by mouth daily., Disp: 30 tablet, Rfl: 4   hydroxychloroquine (PLAQUENIL) 200 MG tablet, Take 200 mg by mouth daily., Disp: , Rfl:    MELATONIN PO, Take 1 Dose by mouth at bedtime., Disp: , Rfl:    Multiple Vitamin (MULTI-VITAMIN DAILY PO), Take by mouth., Disp: , Rfl:    pantoprazole (PROTONIX) 40 MG tablet, Take 1 tablet (40 mg total) by mouth daily., Disp: 90 tablet, Rfl: 4   Testosterone 1.62 % GEL, Place 1 application onto the skin 3 (three) times a week. I am unable to make the computer do a 4% gel but please use, Disp: 15 g, Rfl: 2   tiZANidine (ZANAFLEX) 4 MG tablet, Take 1 tablet (4 mg total) by mouth every 6 (six) hours as needed for muscle spasms., Disp: 60 tablet, Rfl: 2   triamcinolone cream (KENALOG) 0.1 %, Apply 1 application topically 2 (two) times daily., Disp: 30 g, Rfl: 0   buPROPion (WELLBUTRIN SR) 150 MG 12 hr tablet, Take 1 tablet (150 mg total) by mouth 2 (two) times daily. (Patient not taking: Reported on 09/14/2021), Disp: 180 tablet, Rfl: 4   estradiol (ESTRACE VAGINAL) 0.1 MG/GM vaginal cream, Place 1 Applicatorful vaginally at bedtime. Place pea-sized amount vaginally at bedtime., Disp: 42.5 g, Rfl: 1   eszopiclone (LUNESTA) 1 MG TABS tablet, Take 1 tablet (1 mg total) by mouth at bedtime as needed for sleep. Take  immediately before bedtime, Disp: 30 tablet, Rfl: 1    Assessment & Plan:  Cardiac murmer : seen cards, no further wu per cards. Pt says she was scheduled to fu with 1  yr.  add hctz for pedal edema.  Take this first thing in the mornign, increase water intake.   Creat up at 1.34 Egfr at 41 To take hctz only when has pedal edema.  Not to take everyday continue to increase H2O intake.  Insomnia is on ambien would like to trazadone Stopped trazadone sec to interactions with plaquenil  Will start pt on lunesta for insomnia as sh eha had freqent awakening.  Is on tizanidine 4 mg to try taking this q pm    Problem List Items Addressed This Visit   None Visit Diagnoses     Need for influenza vaccination    -  Primary   Relevant Orders   Flu Vaccine QUAD High Dose(Fluad)   Vaginal dryness, menopausal       Relevant Medications   estradiol (ESTRACE VAGINAL) 0.1 MG/GM vaginal cream        Orders Placed This Encounter  Procedures   Flu Vaccine QUAD High Dose(Fluad)     Meds ordered this encounter  Medications   DISCONTD: eszopiclone (LUNESTA) 1 MG TABS tablet    Sig: Take 1 tablet (1 mg total) by mouth at bedtime as needed for sleep. Take immediately before bedtime    Dispense:  30 tablet    Refill:  1   eszopiclone (LUNESTA) 1 MG TABS tablet    Sig: Take 1 tablet (1 mg total) by mouth at bedtime as needed for sleep. Take immediately before bedtime    Dispense:  30 tablet    Refill:  1   estradiol (ESTRACE VAGINAL) 0.1 MG/GM vaginal cream    Sig: Place 1 Applicatorful vaginally at bedtime. Place pea-sized amount vaginally at bedtime.    Dispense:  42.5 g    Refill:  1     Follow up plan: Return in about 6 months (around 03/14/2022).

## 2021-09-14 NOTE — Patient Instructions (Signed)

## 2021-09-22 DIAGNOSIS — Z23 Encounter for immunization: Secondary | ICD-10-CM | POA: Insufficient documentation

## 2021-09-22 DIAGNOSIS — N951 Menopausal and female climacteric states: Secondary | ICD-10-CM | POA: Insufficient documentation

## 2021-10-12 ENCOUNTER — Encounter: Payer: Self-pay | Admitting: Internal Medicine

## 2021-10-12 ENCOUNTER — Telehealth (INDEPENDENT_AMBULATORY_CARE_PROVIDER_SITE_OTHER): Payer: PPO | Admitting: Internal Medicine

## 2021-10-12 DIAGNOSIS — M25551 Pain in right hip: Secondary | ICD-10-CM | POA: Diagnosis not present

## 2021-10-12 DIAGNOSIS — G4700 Insomnia, unspecified: Secondary | ICD-10-CM

## 2021-10-12 MED ORDER — ESZOPICLONE 2 MG PO TABS: 2.0000 mg | ORAL_TABLET | Freq: Every evening | ORAL | 4 refills | Status: DC | PRN

## 2021-10-12 NOTE — Progress Notes (Signed)
There were no vitals taken for this visit.   Subjective:    Patient ID: Lindsey Blair, female    DOB: 1945-11-07, 76 y.o.   MRN: [id number]  Chief Complaint  Patient presents with   Insomnia    HPI: Lindsey Blair is a 76 y.o. female   This visit was completed via telephone due to the restrictions of the COVID-19 pandemic. All issues as above were discussed and addressed but no physical exam was performed. If it was felt that the patient should be evaluated in the office, they were directed there. The patient verbally consented to this visit. Patient was unable to complete an audio/visual visit due to Technical difficulties. Due to the catastrophic nature of the COVID-19 pandemic, this visit was done through audio contact only. Location of the patient: home Location of the provider: work Those involved with this call:  Provider: Charlynne Cousins, MD CMA: Frazier Butt, [place] Desk/Registration: [employer]  Time spent on call: 10 minutes on the phone discussing health concerns. 10 minutes total spent in review of patient's record and preparation of their chart.    Insomnia Primary symptoms: no fragmented sleep, no sleep disturbance, no difficulty falling asleep, no somnolence, no frequent awakening, no premature morning awakening, no malaise/fatigue, no napping.    Hip Pain  The incident occurred more than 1 week ago.   Chief Complaint  Patient presents with   Insomnia    Relevant past medical, surgical, family and social history reviewed and updated as indicated. Interim medical history since our last visit reviewed. Allergies and medications reviewed and updated.  Review of Systems  Constitutional:  Negative for malaise/fatigue.  Psychiatric/Behavioral:  Negative for sleep disturbance. The patient has insomnia.    Per HPI unless specifically indicated above     Objective:    There were no vitals taken for this visit.  Wt Readings from Last 3 Encounters:   09/14/21 133 lb (60.3 kg)  07/14/21 128 lb 6.4 oz (58.2 kg)  05/20/21 122 lb 9.2 oz (55.6 kg)    Physical Exam Vitals and nursing note reviewed.  Constitutional:      General: She is not in acute distress.    Appearance: Normal appearance. She is not ill-appearing or diaphoretic.  Eyes:     Conjunctiva/sclera: Conjunctivae normal.  Cardiovascular:     Rate and Rhythm: Normal rate and regular rhythm.     Heart sounds: No murmur heard. Pulmonary:     Effort: Pulmonary effort is normal. No respiratory distress.     Breath sounds: No stridor. No rhonchi.  Abdominal:     General: Abdomen is flat. Bowel sounds are normal. There is no distension.     Palpations: Abdomen is soft. There is no mass.     Tenderness: There is no abdominal tenderness. There is no guarding.  Musculoskeletal:        General: Tenderness present. No swelling, deformity or signs of injury.  Skin:    General: Skin is warm and dry.     Coloration: Skin is not jaundiced.     Findings: No erythema.  Neurological:     Mental Status: She is alert.    Results for orders placed or performed in visit on 09/08/21  CBC with Differential/Platelet  Result Value Ref Range   WBC 5.5 3.4 - 10.8 x10E3/uL   RBC 5.12 3.77 - 5.28 x10E6/uL   Hemoglobin 14.6 11.1 - 15.9 g/dL   Hematocrit 43.9 34.0 - 46.6 %  MCV 86 79 - 97 fL   MCH 28.5 26.6 - 33.0 pg   MCHC 33.3 31.5 - 35.7 g/dL   RDW 11.7 11.7 - 15.4 %   Platelets 201 150 - 450 x10E3/uL   Neutrophils 63 Not Estab. %   Lymphs 28 Not Estab. %   Monocytes 7 Not Estab. %   Eos 1 Not Estab. %   Basos 1 Not Estab. %   Neutrophils Absolute 3.5 1.4 - 7.0 x10E3/uL   Lymphocytes Absolute 1.6 0.7 - 3.1 x10E3/uL   Monocytes Absolute 0.4 0.1 - 0.9 x10E3/uL   EOS (ABSOLUTE) 0.1 0.0 - 0.4 x10E3/uL   Basophils Absolute 0.0 0.0 - 0.2 x10E3/uL   Immature Granulocytes 0 Not Estab. %   Immature Grans (Abs) 0.0 0.0 - 0.1 x10E3/uL  Lipid panel  Result Value Ref Range   Cholesterol,  Total 183 100 - 199 mg/dL   Triglycerides 126 0 - 149 mg/dL   HDL 66 >39 mg/dL   VLDL Cholesterol Cal 22 5 - 40 mg/dL   LDL Chol Calc (NIH) 95 0 - 99 mg/dL   Chol/HDL Ratio 2.8 0.0 - 4.4 ratio  Comprehensive metabolic panel  Result Value Ref Range   Glucose 125 (H) 70 - 99 mg/dL   BUN 17 8 - 27 mg/dL   Creatinine, Ser 1.34 (H) 0.57 - 1.00 mg/dL   eGFR 41 (L) >59 mL/min/1.73   BUN/Creatinine Ratio 13 12 - 28   Sodium 138 134 - 144 mmol/L   Potassium 4.0 3.5 - 5.2 mmol/L   Chloride 102 96 - 106 mmol/L   CO2 25 20 - 29 mmol/L   Calcium 9.3 8.7 - 10.3 mg/dL   Total Protein 6.8 6.0 - 8.5 g/dL   Albumin 4.5 3.7 - 4.7 g/dL   Globulin, Total 2.3 1.5 - 4.5 g/dL   Albumin/Globulin Ratio 2.0 1.2 - 2.2   Bilirubin Total 0.4 0.0 - 1.2 mg/dL   Alkaline Phosphatase 77 44 - 121 IU/L   AST 26 0 - 40 IU/L   ALT 23 0 - 32 IU/L        Current Outpatient Medications:    amLODipine-benazepril (LOTREL) 10-20 MG capsule, Take 1 capsule by mouth daily., Disp: 90 capsule, Rfl: 4   atorvastatin (LIPITOR) 20 MG tablet, Take 1 tablet (20 mg total) by mouth daily., Disp: 90 tablet, Rfl: 4   buPROPion (WELLBUTRIN SR) 150 MG 12 hr tablet, Take 1 tablet (150 mg total) by mouth 2 (two) times daily. (Patient not taking: Reported on 09/14/2021), Disp: 180 tablet, Rfl: 4   estradiol (ESTRACE VAGINAL) 0.1 MG/GM vaginal cream, Place 1 Applicatorful vaginally at bedtime. Place pea-sized amount vaginally at bedtime., Disp: 42.5 g, Rfl: 1   eszopiclone (LUNESTA) 2 MG TABS tablet, Take 1 tablet (2 mg total) by mouth at bedtime as needed for sleep. Take immediately before bedtime, Disp: 30 tablet, Rfl: 4   fexofenadine (ALLEGRA ALLERGY) 180 MG tablet, Take 1 tablet (180 mg total) by mouth daily., Disp: 10 tablet, Rfl: 1   hydrochlorothiazide (HYDRODIURIL) 12.5 MG tablet, Take 1 tablet (12.5 mg total) by mouth daily., Disp: 30 tablet, Rfl: 4   hydroxychloroquine (PLAQUENIL) 200 MG tablet, Take 200 mg by mouth  daily., Disp: , Rfl:    MELATONIN PO, Take 1 Dose by mouth at bedtime., Disp: , Rfl:    Multiple Vitamin (MULTI-VITAMIN DAILY PO), Take by mouth., Disp: , Rfl:    pantoprazole (PROTONIX) 40 MG tablet, Take 1 tablet (40 mg total)  by mouth daily., Disp: 90 tablet, Rfl: 4   Testosterone 1.62 % GEL, Place 1 application onto the skin 3 (three) times a week. I am unable to make the computer do a 4% gel but please use, Disp: 15 g, Rfl: 2   tiZANidine (ZANAFLEX) 4 MG tablet, Take 1 tablet (4 mg total) by mouth every 6 (six) hours as needed for muscle spasms., Disp: 60 tablet, Rfl: 2   triamcinolone cream (KENALOG) 0.1 %, Apply 1 application topically 2 (two) times daily., Disp: 30 g, Rfl: 0    Assessment & Plan:  Insomnia  Is on lunesta 1 mg  Helps some would like to increase dose for such  Takes ms relaxant for her back pain  Has a pain in right hip   Right hip pain - will refer to ortho asap. Not on meds for such  Needs eval and rx for such   Problem List Items Addressed This Visit       Other   Insomnia   Right hip pain - Primary   Relevant Orders   Ambulatory referral to Orthopedics     Orders Placed This Encounter  Procedures   Ambulatory referral to Orthopedics     Meds ordered this encounter  Medications   eszopiclone (LUNESTA) 2 MG TABS tablet    Sig: Take 1 tablet (2 mg total) by mouth at bedtime as needed for sleep. Take immediately before bedtime    Dispense:  30 tablet    Refill:  4     Follow up plan: Return in about 6 weeks (around 11/23/2021).

## 2021-10-14 ENCOUNTER — Encounter: Payer: Self-pay | Admitting: Internal Medicine

## 2021-10-28 DIAGNOSIS — M7061 Trochanteric bursitis, right hip: Secondary | ICD-10-CM | POA: Diagnosis not present

## 2021-11-01 ENCOUNTER — Ambulatory Visit: Payer: Self-pay

## 2021-11-01 ENCOUNTER — Other Ambulatory Visit: Payer: Self-pay | Admitting: Nurse Practitioner

## 2021-11-01 NOTE — Telephone Encounter (Signed)
Spoke with patient and stated that she should only be taking 1 2mg  tab of Lunesta, or if she still had 1mg  tabs left she could take two of those. However patient was told not to take 2 2mg  tabs. Patient verbalized understanding.

## 2021-11-01 NOTE — Telephone Encounter (Signed)
Patient called in to inform Dr Neomia Dear that she ran out of her eszopiclone (LUNESTA) 2 MG TABS tablet stated that her last visit she was advised ti take 2 tabs at a time so this caused her to run out early and the pharmacy states that they need a new Rx stating that she is taking 2 tabs. Please send to [place], [place]  Phone: [phone]  Fax: [phone]                 Left message to call back.

## 2021-11-02 NOTE — Telephone Encounter (Signed)
Requested medications are due for refill today.  unsure  Requested medications are on the active medications list.  no  Last refill. 02/08/2021  15/2 refills  Future visit scheduled.   yes  Notes to clinic.  4% gel is not on med list. No protocol for this medication    Requested Prescriptions  Pending Prescriptions Disp Refills   Testosterone 1.62 % GEL [Pharmacy Med Name: TESTOSTERONE 4% GEL] 15 g     Sig: APPLY 1 APPLICATION ONTO THE SKIN 3 TIMES A WEEK.     Off-Protocol Failed - 11/01/2021  2:51 PM      Failed - Medication not assigned to a protocol, review manually.      Passed - Valid encounter within last 12 months    Recent Outpatient Visits           3 weeks ago Right hip pain   [facility] Vigg, Avanti, MD   1 month ago Need for influenza vaccination   [facility] Vigg, Avanti, MD   3 months ago Cardiac murmur   [facility] Vigg, Avanti, MD   4 months ago Primary hypertension   [place], MD   5 months ago COVID-19   [place], NP       Future Appointments             In 2 weeks Vigg, Avanti, MD [facility]   In 4 months Vigg, Avanti, MD [facility], [facility]

## 2021-11-11 DIAGNOSIS — H26491 Other secondary cataract, right eye: Secondary | ICD-10-CM | POA: Diagnosis not present

## 2021-11-16 DIAGNOSIS — X32XXXA Exposure to sunlight, initial encounter: Secondary | ICD-10-CM | POA: Diagnosis not present

## 2021-11-16 DIAGNOSIS — Z85828 Personal history of other malignant neoplasm of skin: Secondary | ICD-10-CM | POA: Diagnosis not present

## 2021-11-16 DIAGNOSIS — D2271 Melanocytic nevi of right lower limb, including hip: Secondary | ICD-10-CM | POA: Diagnosis not present

## 2021-11-16 DIAGNOSIS — D2261 Melanocytic nevi of right upper limb, including shoulder: Secondary | ICD-10-CM | POA: Diagnosis not present

## 2021-11-16 DIAGNOSIS — D2262 Melanocytic nevi of left upper limb, including shoulder: Secondary | ICD-10-CM | POA: Diagnosis not present

## 2021-11-16 DIAGNOSIS — Z79899 Other long term (current) drug therapy: Secondary | ICD-10-CM | POA: Diagnosis not present

## 2021-11-16 DIAGNOSIS — C44519 Basal cell carcinoma of skin of other part of trunk: Secondary | ICD-10-CM | POA: Diagnosis not present

## 2021-11-16 DIAGNOSIS — L570 Actinic keratosis: Secondary | ICD-10-CM | POA: Diagnosis not present

## 2021-11-16 DIAGNOSIS — D485 Neoplasm of uncertain behavior of skin: Secondary | ICD-10-CM | POA: Diagnosis not present

## 2021-11-17 ENCOUNTER — Other Ambulatory Visit: Payer: Self-pay | Admitting: Internal Medicine

## 2021-11-17 ENCOUNTER — Other Ambulatory Visit: Payer: Self-pay

## 2021-11-17 ENCOUNTER — Ambulatory Visit (INDEPENDENT_AMBULATORY_CARE_PROVIDER_SITE_OTHER): Payer: PPO | Admitting: Internal Medicine

## 2021-11-17 VITALS — BP 132/78 | HR 60 | Temp 98.3°F | Ht 62.6 in | Wt 130.4 lb

## 2021-11-17 DIAGNOSIS — G4700 Insomnia, unspecified: Secondary | ICD-10-CM | POA: Diagnosis not present

## 2021-11-17 DIAGNOSIS — I10 Essential (primary) hypertension: Secondary | ICD-10-CM

## 2021-11-17 DIAGNOSIS — N951 Menopausal and female climacteric states: Secondary | ICD-10-CM

## 2021-11-17 LAB — URINALYSIS, ROUTINE W REFLEX MICROSCOPIC
Bilirubin, UA: NEGATIVE
Glucose, UA: NEGATIVE
Ketones, UA: NEGATIVE
Leukocytes,UA: NEGATIVE
Nitrite, UA: NEGATIVE
Protein,UA: NEGATIVE
RBC, UA: NEGATIVE
Specific Gravity, UA: 1.01 (ref 1.005–1.030)
Urobilinogen, Ur: 0.2 mg/dL (ref 0.2–1.0)
pH, UA: 6.5 (ref 5.0–7.5)

## 2021-11-17 MED ORDER — ESTRADIOL 0.1 MG/GM VA CREA: 1.0000 | TOPICAL_CREAM | Freq: Every day | VAGINAL | 1 refills | Status: DC

## 2021-11-17 MED ORDER — ESZOPICLONE 2 MG PO TABS: 2.0000 mg | ORAL_TABLET | Freq: Every evening | ORAL | 1 refills | Status: DC | PRN

## 2021-11-17 NOTE — Telephone Encounter (Signed)
Medication Refill - Medication: Testosterone 1.62 % GEL  ? ?Has the patient contacted their pharmacy? Yes.   ? ?Preferred Pharmacy (with phone number or street name): [place], [place] ? ?Has the patient been seen for an appointment in the last year OR does the patient have an upcoming appointment? Yes.   ? ?Pt states she was told if she saw the dr, she could get this refilled.  She saw the dr today, but forgot to tell her she needs this med. ? ?Agent: Please be advised that RX refills may take up to 3 business days. We ask that you follow-up with your pharmacy. ? ?

## 2021-11-17 NOTE — Progress Notes (Signed)
? ?BP 132/78   Pulse 60   Temp 98.3 ?F (36.8 ?C) (Oral)   Ht 5' 2.6" (1.59 m)   Wt 130 lb 6.4 oz (59.1 kg)   SpO2 96%   BMI 23.40 kg/m?   ? ?Subjective:  ? ? Patient ID: Lindsey Blair, female    DOB: 02/11/1946, 76 y.o.   MRN: [id number] ? ?Chief Complaint  ?Patient presents with  ? Hypertension  ? Heart Murmur  ? ? ?HPI: ?Lindsey Blair is a 76 y.o. female ? ?Hypertension ?This is a chronic problem. The current episode started more than 1 year ago.  ? ?Chief Complaint  ?Patient presents with  ? Hypertension  ? Heart Murmur  ? ? ?Relevant past medical, surgical, family and social history reviewed and updated as indicated. Interim medical history since our last visit reviewed. ?Allergies and medications reviewed and updated. ? ?Review of Systems ? ?Per HPI unless specifically indicated above ? ?   ?Objective:  ?  ?BP 132/78   Pulse 60   Temp 98.3 ?F (36.8 ?C) (Oral)   Ht 5' 2.6" (1.59 m)   Wt 130 lb 6.4 oz (59.1 kg)   SpO2 96%   BMI 23.40 kg/m?   ?Wt Readings from Last 3 Encounters:  ?11/17/21 130 lb 6.4 oz (59.1 kg)  ?09/14/21 133 lb (60.3 kg)  ?07/14/21 128 lb 6.4 oz (58.2 kg)  ?  ?Physical Exam ?Vitals and nursing note reviewed.  ?Constitutional:   ?   General: She is not in acute distress. ?   Appearance: Normal appearance. She is not ill-appearing or diaphoretic.  ?Eyes:  ?   Conjunctiva/sclera: Conjunctivae normal.  ?Pulmonary:  ?   Breath sounds: No rhonchi.  ?Abdominal:  ?   General: Abdomen is flat. Bowel sounds are normal. There is no distension.  ?   Palpations: Abdomen is soft. There is no mass.  ?   Tenderness: There is no abdominal tenderness. There is no guarding.  ?Skin: ?   General: Skin is warm and dry.  ?   Coloration: Skin is not jaundiced.  ?   Findings: No erythema.  ?Neurological:  ?   Mental Status: She is alert.  ? ? ?Results for orders placed or performed in visit on 11/17/21  ?Comprehensive metabolic panel  ?Result Value Ref Range  ? Glucose 86 70 - 99 mg/dL  ? BUN 13 8 - 27  mg/dL  ? Creatinine, Ser 1.06 (H) 0.57 - 1.00 mg/dL  ? eGFR 54 (L) >59 mL/min/1.73  ? BUN/Creatinine Ratio 12 12 - 28  ? Sodium 138 134 - 144 mmol/L  ? Potassium 4.0 3.5 - 5.2 mmol/L  ? Chloride 99 96 - 106 mmol/L  ? CO2 22 20 - 29 mmol/L  ? Calcium 9.8 8.7 - 10.3 mg/dL  ? Total Protein 6.9 6.0 - 8.5 g/dL  ? Albumin 4.5 3.7 - 4.7 g/dL  ? Globulin, Total 2.4 1.5 - 4.5 g/dL  ? Albumin/Globulin Ratio 1.9 1.2 - 2.2  ? Bilirubin Total 0.6 0.0 - 1.2 mg/dL  ? Alkaline Phosphatase 73 44 - 121 IU/L  ? AST 22 0 - 40 IU/L  ? ALT 15 0 - 32 IU/L  ?CBC with Differential/Platelet  ?Result Value Ref Range  ? WBC 4.8 3.4 - 10.8 x10E3/uL  ? RBC 5.13 3.77 - 5.28 x10E6/uL  ? Hemoglobin 14.4 11.1 - 15.9 g/dL  ? Hematocrit 43.2 34.0 - 46.6 %  ? MCV 84 79 - 97 fL  ? [place]  28.1 26.6 - 33.0 pg  ? MCHC 33.3 31.5 - 35.7 g/dL  ? RDW 11.9 11.7 - 15.4 %  ? Platelets 187 150 - 450 x10E3/uL  ? Neutrophils 51 Not Estab. %  ? Lymphs 37 Not Estab. %  ? Monocytes 10 Not Estab. %  ? Eos 1 Not Estab. %  ? Basos 1 Not Estab. %  ? Neutrophils Absolute 2.5 1.4 - 7.0 x10E3/uL  ? Lymphocytes Absolute 1.8 0.7 - 3.1 x10E3/uL  ? Monocytes Absolute 0.5 0.1 - 0.9 x10E3/uL  ? EOS (ABSOLUTE) 0.1 0.0 - 0.4 x10E3/uL  ? Basophils Absolute 0.0 0.0 - 0.2 x10E3/uL  ? Immature Granulocytes 0 Not Estab. %  ? Immature Grans (Abs) 0.0 0.0 - 0.1 x10E3/uL  ?Urinalysis, Routine w reflex microscopic  ?Result Value Ref Range  ? Specific Gravity, UA 1.010 1.005 - 1.030  ? pH, UA 6.5 5.0 - 7.5  ? Color, UA Yellow Yellow  ? Appearance Ur Clear Clear  ? Leukocytes,UA Negative Negative  ? Protein,UA Negative Negative/Trace  ? Glucose, UA Negative Negative  ? Ketones, UA Negative Negative  ? RBC, UA Negative Negative  ? Bilirubin, UA Negative Negative  ? Urobilinogen, Ur 0.2 0.2 - 1.0 mg/dL  ? Nitrite, UA Negative Negative  ?TSH  ?Result Value Ref Range  ? TSH 1.080 0.450 - 4.500 uIU/mL  ?Lipid panel  ?Result Value Ref Range  ? Cholesterol, Total 181 100 - 199 mg/dL  ? Triglycerides 92 0  - 149 mg/dL  ? HDL 76 >39 mg/dL  ? VLDL Cholesterol Cal 17 5 - 40 mg/dL  ? LDL Chol Calc (NIH) 88 0 - 99 mg/dL  ? Chol/HDL Ratio 2.4 0.0 - 4.4 ratio  ? ?   ? ? ?Current Outpatient Medications:  ?  amLODipine-benazepril (LOTREL) 10-20 MG capsule, Take 1 capsule by mouth daily., Disp: 90 capsule, Rfl: 4 ?  atorvastatin (LIPITOR) 20 MG tablet, Take 1 tablet (20 mg total) by mouth daily., Disp: 90 tablet, Rfl: 4 ?  fexofenadine (ALLEGRA ALLERGY) 180 MG tablet, Take 1 tablet (180 mg total) by mouth daily., Disp: 10 tablet, Rfl: 1 ?  hydrochlorothiazide (HYDRODIURIL) 12.5 MG tablet, Take 1 tablet (12.5 mg total) by mouth daily., Disp: 30 tablet, Rfl: 4 ?  hydroxychloroquine (PLAQUENIL) 200 MG tablet, Take 200 mg by mouth daily., Disp: , Rfl:  ?  estradiol (ESTRACE VAGINAL) 0.1 MG/GM vaginal cream, Place 1 Applicatorful vaginally at bedtime. Place pea-sized amount vaginally at bedtime., Disp: 42.5 g, Rfl: 1 ?  eszopiclone (LUNESTA) 2 MG TABS tablet, Take 1 tablet (2 mg total) by mouth at bedtime as needed for sleep. Take immediately before bedtime, Disp: 30 tablet, Rfl: 1 ?  Multiple Vitamin (MULTI-VITAMIN DAILY PO), Take by mouth. (Patient not taking: Reported on 11/17/2021), Disp: , Rfl:  ?  Testosterone 1.62 % GEL, Place 1 application. onto the skin 3 (three) times a week. I am unable to make the computer do a 4% gel but please use, Disp: 15 g, Rfl: 2  ? ? ?Assessment & Plan:  ?Insomnia  ?Is on 2 mg of lunesta for such ?Better on such  ?  ?Right hip pain - will refer to ortho asap. ?Sec to rrochanteric bursitis , stable, improvied seen ortho s/p injeciton  ? ? ?Pedal edema better with  hctz for pedal edema. , increase water intake.  ? Creat up at 1.34 ?Egfr at 32. ? ?Problem List Items Addressed This Visit   ? ?  ? Cardiovascular and Mediastinum  ?  Hypertension - Primary  ? Relevant Orders  ? Comprehensive metabolic panel (Completed)  ? CBC with Differential/Platelet (Completed)  ? Lipid panel  ? Urinalysis, Routine w  reflex microscopic (Completed)  ? TSH (Completed)  ?  ? Genitourinary  ? Vaginal dryness, menopausal  ? Relevant Medications  ? estradiol (ESTRACE VAGINAL) 0.1 MG/GM vaginal cream  ?  ? Other  ? Insomnia  ? Relevant Orders  ? Comprehensive metabolic panel (Completed)  ? CBC with Differential/Platelet (Completed)  ? Lipid panel  ? Urinalysis, Routine w reflex microscopic (Completed)  ? TSH (Completed)  ?  ? ?Orders Placed This Encounter  ?Procedures  ? Comprehensive metabolic panel  ? CBC with Differential/Platelet  ? Lipid panel  ? Urinalysis, Routine w reflex microscopic  ? TSH  ? Lipid panel  ?  ? ?Meds ordered this encounter  ?Medications  ? estradiol (ESTRACE VAGINAL) 0.1 MG/GM vaginal cream  ?  Sig: Place 1 Applicatorful vaginally at bedtime. Place pea-sized amount vaginally at bedtime.  ?  Dispense:  42.5 g  ?  Refill:  1  ? eszopiclone (LUNESTA) 2 MG TABS tablet  ?  Sig: Take 1 tablet (2 mg total) by mouth at bedtime as needed for sleep. Take immediately before bedtime  ?  Dispense:  30 tablet  ?  Refill:  1  ?  ? ?Follow up plan: ?No follow-ups on file. ? ? ? ?

## 2021-11-18 ENCOUNTER — Other Ambulatory Visit: Payer: Self-pay

## 2021-11-18 ENCOUNTER — Telehealth: Payer: Self-pay

## 2021-11-18 LAB — COMPREHENSIVE METABOLIC PANEL
ALT: 15 IU/L (ref 0–32)
AST: 22 IU/L (ref 0–40)
Albumin/Globulin Ratio: 1.9 (ref 1.2–2.2)
Albumin: 4.5 g/dL (ref 3.7–4.7)
Alkaline Phosphatase: 73 IU/L (ref 44–121)
BUN/Creatinine Ratio: 12 (ref 12–28)
BUN: 13 mg/dL (ref 8–27)
Bilirubin Total: 0.6 mg/dL (ref 0.0–1.2)
CO2: 22 mmol/L (ref 20–29)
Calcium: 9.8 mg/dL (ref 8.7–10.3)
Chloride: 99 mmol/L (ref 96–106)
Creatinine, Ser: 1.06 mg/dL — ABNORMAL HIGH (ref 0.57–1.00)
Globulin, Total: 2.4 g/dL (ref 1.5–4.5)
Glucose: 86 mg/dL (ref 70–99)
Potassium: 4 mmol/L (ref 3.5–5.2)
Sodium: 138 mmol/L (ref 134–144)
Total Protein: 6.9 g/dL (ref 6.0–8.5)
eGFR: 54 mL/min/{1.73_m2} — ABNORMAL LOW (ref >59)

## 2021-11-18 LAB — CBC WITH DIFFERENTIAL/PLATELET
Basophils Absolute: 0 10*3/uL (ref 0.0–0.2)
Basos: 1 %
EOS (ABSOLUTE): 0.1 10*3/uL (ref 0.0–0.4)
Eos: 1 %
Hematocrit: 43.2 % (ref 34.0–46.6)
Hemoglobin: 14.4 g/dL (ref 11.1–15.9)
Immature Grans (Abs): 0 10*3/uL (ref 0.0–0.1)
Immature Granulocytes: 0 %
Lymphocytes Absolute: 1.8 10*3/uL (ref 0.7–3.1)
Lymphs: 37 %
MCH: 28.1 pg (ref 26.6–33.0)
MCHC: 33.3 g/dL (ref 31.5–35.7)
MCV: 84 fL (ref 79–97)
Monocytes Absolute: 0.5 10*3/uL (ref 0.1–0.9)
Monocytes: 10 %
Neutrophils Absolute: 2.5 10*3/uL (ref 1.4–7.0)
Neutrophils: 51 %
Platelets: 187 10*3/uL (ref 150–450)
RBC: 5.13 x10E6/uL (ref 3.77–5.28)
RDW: 11.9 % (ref 11.7–15.4)
WBC: 4.8 10*3/uL (ref 3.4–10.8)

## 2021-11-18 LAB — LIPID PANEL
Chol/HDL Ratio: 2.4 ratio (ref 0.0–4.4)
Cholesterol, Total: 181 mg/dL (ref 100–199)
HDL: 76 mg/dL (ref >39)
LDL Chol Calc (NIH): 88 mg/dL (ref 0–99)
Triglycerides: 92 mg/dL (ref 0–149)
VLDL Cholesterol Cal: 17 mg/dL (ref 5–40)

## 2021-11-18 LAB — TSH: TSH: 1.08 u[IU]/mL (ref 0.450–4.500)

## 2021-11-18 MED ORDER — TESTOSTERONE 1.62 % TD GEL: 1.0000 "application " | TRANSDERMAL | 2 refills | Status: DC

## 2021-11-18 NOTE — Telephone Encounter (Signed)
Ok to refill thnx

## 2021-11-18 NOTE — Telephone Encounter (Signed)
Left message for patient that her testosterone prescription is ready but was printed and was not e-scribed so she will need to come and pick it up on office ?

## 2021-12-13 ENCOUNTER — Other Ambulatory Visit: Payer: Self-pay | Admitting: Nurse Practitioner

## 2021-12-14 NOTE — Telephone Encounter (Signed)
Requested Prescriptions  ?Pending Prescriptions Disp Refills  ?? amLODipine-benazepril (LOTREL) 10-20 MG capsule [Pharmacy Med Name: AMLOD/BENAZEPRIL 10/20MG] 90 capsule 1  ?  Sig: Take 1 capsule by mouth daily.  ?  ? Cardiovascular: CCB + ACEI Combos Failed - 12/13/2021  9:07 AM  ?  ?  Failed - Cr in normal range and within 180 days  ?  Creatinine  ?Date Value Ref Range Status  ?08/11/2020 76.6 20.0 - 300.0 mg/dL Final  ? ?Creatinine, Ser  ?Date Value Ref Range Status  ?11/17/2021 1.06 (H) 0.57 - 1.00 mg/dL Final  ?   ?  ?  Passed - K in normal range and within 180 days  ?  Potassium  ?Date Value Ref Range Status  ?11/17/2021 4.0 3.5 - 5.2 mmol/L Final  ?   ?  ?  Passed - Na in normal range and within 180 days  ?  Sodium  ?Date Value Ref Range Status  ?11/17/2021 138 134 - 144 mmol/L Final  ?   ?  ?  Passed - eGFR is 30 or above and within 180 days  ?  GFR calc Af Amer  ?Date Value Ref Range Status  ?08/11/2020 59 (L) >59 mL/min/1.73 Final  ?  Comment:  ?  **In accordance with recommendations from the NKF-ASN Task force,** ?  [facility] is in the process of updating its eGFR calculation to the ?  2021 CKD-EPI creatinine equation that estimates kidney function ?  without a race variable. ?  ? ?GFR calc non Af Amer  ?Date Value Ref Range Status  ?08/11/2020 51 (L) >59 mL/min/1.73 Final  ? ?eGFR  ?Date Value Ref Range Status  ?11/17/2021 54 (L) >59 mL/min/1.73 Final  ?   ?  ?  Passed - Patient is not pregnant  ?  ?  Passed - Last BP in normal range  ?  BP Readings from Last 1 Encounters:  ?11/17/21 132/78  ?   ?  ?  Passed - Valid encounter within last 6 months  ?  Recent Outpatient Visits   ?      ? 3 weeks ago Primary hypertension  ? [facility] Vigg, Avanti, MD  ? 2 months ago Right hip pain  ? [facility] Vigg, Avanti, MD  ? 3 months ago Need for influenza vaccination  ? [facility] Vigg, Avanti, MD  ? 5 months ago Cardiac murmur  ? [facility] Vigg, Avanti, MD  ? 6  months ago Primary hypertension  ? [facility] Vigg, Avanti, MD  ?  ?  ?Future Appointments   ?        ? In 3 months Vigg, Avanti, MD [facility]  ?  ? ?  ?  ?  ? ?

## 2021-12-20 DIAGNOSIS — C44519 Basal cell carcinoma of skin of other part of trunk: Secondary | ICD-10-CM | POA: Diagnosis not present

## 2022-01-24 ENCOUNTER — Other Ambulatory Visit: Payer: Self-pay | Admitting: Internal Medicine

## 2022-01-25 NOTE — Telephone Encounter (Signed)
Requested Prescriptions  Pending Prescriptions Disp Refills  . hydrochlorothiazide (HYDRODIURIL) 12.5 MG tablet [Pharmacy Med Name: HYDROCHLOROTHIAZIDE 12.5 MG TB] 90 tablet 0    Sig: Take 1 tablet (12.5 mg total) by mouth daily.     Cardiovascular: Diuretics - Thiazide Failed - 01/24/2022  3:08 PM      Failed - Cr in normal range and within 180 days    Creatinine  Date Value Ref Range Status  08/11/2020 76.6 20.0 - 300.0 mg/dL Final   Creatinine, Ser  Date Value Ref Range Status  11/17/2021 1.06 (H) 0.57 - 1.00 mg/dL Final         Passed - K in normal range and within 180 days    Potassium  Date Value Ref Range Status  11/17/2021 4.0 3.5 - 5.2 mmol/L Final         Passed - Na in normal range and within 180 days    Sodium  Date Value Ref Range Status  11/17/2021 138 134 - 144 mmol/L Final         Passed - Last BP in normal range    BP Readings from Last 1 Encounters:  11/17/21 132/78         Passed - Valid encounter within last 6 months    Recent Outpatient Visits          2 months ago Primary hypertension   [place] Vigg, Avanti, MD   3 months ago Right hip pain   [facility] Vigg, Avanti, MD   4 months ago Need for influenza vaccination   [place] Vigg, Avanti, MD   6 months ago Cardiac murmur   [facility] Vigg, Avanti, MD   7 months ago Primary hypertension   [place], MD      Future Appointments            In 1 month Mecum, Dani Gobble, PA-C [facility], [facility]

## 2022-02-03 ENCOUNTER — Telehealth: Payer: Self-pay | Admitting: Internal Medicine

## 2022-02-03 DIAGNOSIS — Z1231 Encounter for screening mammogram for malignant neoplasm of breast: Secondary | ICD-10-CM

## 2022-02-03 NOTE — Telephone Encounter (Signed)
Copied from [place] [id number]. Topic: General - Other >> Feb 03, 2022 12:17 PM Lindsey Blair wrote: Reason for CRM: Pt called in stating she needs to get scheduled for her mammo, and requested if the provider can get everything order, please advise.

## 2022-02-03 NOTE — Telephone Encounter (Signed)
Mammogram ordered

## 2022-02-05 ENCOUNTER — Other Ambulatory Visit: Payer: Self-pay | Admitting: Nurse Practitioner

## 2022-02-05 DIAGNOSIS — E7800 Pure hypercholesterolemia, unspecified: Secondary | ICD-10-CM

## 2022-02-07 NOTE — Telephone Encounter (Signed)
Requested Prescriptions  Pending Prescriptions Disp Refills  . atorvastatin (LIPITOR) 20 MG tablet [Pharmacy Med Name: ATORVASTATIN 20 MG TABLET] 90 tablet 0    Sig: Take 1 tablet (20 mg total) by mouth daily.     Cardiovascular:  Antilipid - Statins Failed - 02/05/2022  9:55 AM      Failed - Lipid Panel in normal range within the last 12 months    Cholesterol, Total  Date Value Ref Range Status  11/17/2021 181 100 - 199 mg/dL Final   Cholesterol Piccolo, Waived  Date Value Ref Range Status  07/20/2016 176 <200 mg/dL Final    Comment:                            Desirable                <200                         Borderline High      200- 239                         High                     >239    LDL Chol Calc (NIH)  Date Value Ref Range Status  11/17/2021 88 0 - 99 mg/dL Final   HDL  Date Value Ref Range Status  11/17/2021 76 >39 mg/dL Final   Triglycerides  Date Value Ref Range Status  11/17/2021 92 0 - 149 mg/dL Final   Triglycerides Piccolo,Waived  Date Value Ref Range Status  07/20/2016 175 (H) <150 mg/dL Final    Comment:                            Normal                   <150                         Borderline High     150 - 199                         High                200 - 499                         Very High                >499          Passed - Patient is not pregnant      Passed - Valid encounter within last 12 months    Recent Outpatient Visits          2 months ago Primary hypertension   [facility] Vigg, Avanti, MD   3 months ago Right hip pain   [facility] Vigg, Avanti, MD   4 months ago Need for influenza vaccination   [place] Vigg, Avanti, MD   6 months ago Cardiac murmur   [place], MD   7 months ago Primary hypertension   [place] Vigg, Avanti, MD      Future Appointments  In 1 month Mecum, Erin E, PA-C [employer] [facility],  [facility]

## 2022-02-15 ENCOUNTER — Other Ambulatory Visit: Payer: Self-pay | Admitting: Nurse Practitioner

## 2022-02-15 NOTE — Telephone Encounter (Signed)
Requested medication (s) are due for refill today - no  Requested medication (s) are on the active medication list -no  Future visit scheduled -yes  Last refill: unsure  Notes to clinic: Medication no longer on current medication list- request sent for PCP review   Requested Prescriptions  Pending Prescriptions Disp Refills   pantoprazole (PROTONIX) 40 MG tablet [Pharmacy Med Name: PANTOPRAZOLE SOD DR 40 MG TAB] 90 tablet 0    Sig: Take 1 tablet (40 mg total) by mouth daily.     Gastroenterology: Proton Pump Inhibitors Passed - 02/15/2022  9:20 AM      Passed - Valid encounter within last 12 months    Recent Outpatient Visits           3 months ago Primary hypertension   [place] Vigg, Avanti, MD   4 months ago Right hip pain   [facility] Vigg, Avanti, MD   5 months ago Need for influenza vaccination   [place] Vigg, Avanti, MD   7 months ago Cardiac murmur   [facility] Vigg, Avanti, MD   8 months ago Primary hypertension   [place], MD       Future Appointments             In 3 weeks Mecum, Dani Gobble, PA-C [facility]               Requested Prescriptions  Pending Prescriptions Disp Refills   pantoprazole ([place]) 40 MG tablet [Pharmacy Med Name: PANTOPRAZOLE SOD DR 40 MG TAB] 90 tablet 0    Sig: Take 1 tablet (40 mg total) by mouth daily.     Gastroenterology: Proton Pump Inhibitors Passed - 02/15/2022  9:20 AM      Passed - Valid encounter within last 12 months    Recent Outpatient Visits           3 months ago Primary hypertension   [place] Vigg, Avanti, MD   4 months ago Right hip pain   [facility] Vigg, Avanti, MD   5 months ago Need for influenza vaccination   [place] Vigg, Avanti, MD   7 months ago Cardiac murmur   [facility] Vigg, Avanti, MD   8 months ago Primary hypertension    [place], MD       Future Appointments             In 3 weeks Mecum, Dani Gobble, PA-C [employer] [facility], [facility]

## 2022-02-19 ENCOUNTER — Other Ambulatory Visit: Payer: Self-pay | Admitting: Nurse Practitioner

## 2022-02-21 NOTE — Telephone Encounter (Signed)
Medication was refilled 02/16/22 by PCP for 90, 1 refill. This is a duplicate request. Will refuse.  Requested Prescriptions  Pending Prescriptions Disp Refills  . pantoprazole (PROTONIX) 40 MG tablet [Pharmacy Med Name: PANTOPRAZOLE SOD DR 40 MG TAB] 90 tablet 0    Sig: Take 1 tablet (40 mg total) by mouth daily.     Gastroenterology: Proton Pump Inhibitors Passed - 02/19/2022  1:28 PM      Passed - Valid encounter within last 12 months    Recent Outpatient Visits          3 months ago Primary hypertension   [place] Vigg, Avanti, MD   4 months ago Right hip pain   [facility] Vigg, Avanti, MD   5 months ago Need for influenza vaccination   [place] Vigg, Avanti, MD   7 months ago Cardiac murmur   [facility] Vigg, Avanti, MD   8 months ago Primary hypertension   [place], MD      Future Appointments            In 3 weeks Mecum, Dani Gobble, PA-C [facility], [facility]

## 2022-03-14 ENCOUNTER — Ambulatory Visit: Payer: PPO | Admitting: Physician Assistant

## 2022-03-15 ENCOUNTER — Ambulatory Visit (INDEPENDENT_AMBULATORY_CARE_PROVIDER_SITE_OTHER): Payer: PPO | Admitting: Unknown Physician Specialty

## 2022-03-15 ENCOUNTER — Encounter: Payer: Self-pay | Admitting: Unknown Physician Specialty

## 2022-03-15 DIAGNOSIS — E7800 Pure hypercholesterolemia, unspecified: Secondary | ICD-10-CM | POA: Diagnosis not present

## 2022-03-15 DIAGNOSIS — F5104 Psychophysiologic insomnia: Secondary | ICD-10-CM | POA: Diagnosis not present

## 2022-03-15 DIAGNOSIS — F3342 Major depressive disorder, recurrent, in full remission: Secondary | ICD-10-CM

## 2022-03-15 DIAGNOSIS — I10 Essential (primary) hypertension: Secondary | ICD-10-CM | POA: Diagnosis not present

## 2022-03-15 MED ORDER — MIRTAZAPINE 15 MG PO TBDP: 15.0000 mg | ORAL_TABLET | Freq: Every day | ORAL | 1 refills | Status: DC

## 2022-03-15 MED ORDER — TIZANIDINE HCL 4 MG PO TABS: 4.0000 mg | ORAL_TABLET | Freq: Four times a day (QID) | ORAL | 2 refills | Status: DC | PRN

## 2022-03-15 NOTE — Assessment & Plan Note (Signed)
Discussed that Unisom and Lunesta on [employer].  Start Mirtazepine 15 mg to help sleep and depression.  Try 2-3 mg of melatonin.  Book for CBT for sleep recommended.

## 2022-03-15 NOTE — Assessment & Plan Note (Signed)
Rx for Mirtazepine 15 mg QHS.  Hopefully this will help with both sleep and depression. Will put back on Wellbutrin if that doesn't work.

## 2022-03-15 NOTE — Assessment & Plan Note (Signed)
Stable, continue present medications.   

## 2022-03-15 NOTE — Progress Notes (Signed)
BP (!) 144/76 (BP Location: Left Arm, Cuff Size: Normal)   Pulse 64   Temp 98.6 F (37 C) (Oral)   Ht 5' 2.7" (1.593 m)   Wt 130 lb 9.6 oz (59.2 kg)   SpO2 97%   BMI 23.36 kg/m    Subjective:    Patient ID: Lindsey Blair, female    DOB: 08-01-1946, 76 y.o.   MRN: [id number]  HPI: Lindsey Blair is a 76 y.o. female  Chief Complaint  Patient presents with   Depression   Hyperlipidemia   Hypertension   Gastroesophageal Reflux   Insomnia She is taking Lunesta prescribed by Dr. Neomia Dear.  Pt states if she does not take anything for sleep, she is up till 4A in the morning.  Always gets up at [place] when her alarm is set. She has taken Trazadone in the past but dermatologist took her off. Had also been on Tranxene years ago.  She takes Unisom on days she doesn't take [place].    Depression Off the Wellbutrin, which is what she wanted, but not doing well now and states she has no patience and is emotional.  States sex life is good off of SSRI if she is using the Testosterone cream.      03/15/2022   11:02 AM 11/17/2021   11:41 AM 09/14/2021    2:04 PM 08/11/2021    9:28 AM 06/15/2021    9:40 AM  Depression screen PHQ 2/9  Decreased Interest 1 0 0 0 0  Down, Depressed, Hopeless 0 0 0 0 0  PHQ - 2 Score 1 0 0 0 0  Altered sleeping $RemoveBeforeDE'2 2 2 'uvPIYBdacDPRvqP$ 0 0  Tired, decreased energy 1 0 1 0 0  Change in appetite 1 0 0 0 0  Feeling bad or failure about yourself  1 0 0 0 0  Trouble concentrating 1 0 1 0 0  Moving slowly or fidgety/restless 1 0 0 0 0  Suicidal thoughts 0 0 0 0 0  PHQ-9 Score $RemoveBef'8 2 4 'xixtRHDJfg$ 0 0  Difficult doing work/chores Somewhat difficult Not difficult at all  Not difficult at all Not difficult at all   Hypertension Using medications without difficulty Average home BPs SBP 120 at home   No problems or lightheadedness No chest pain with exertion or shortness of breath No Edema   Hyperlipidemia Using medications without problems No Muscle aches  Diet compliance:Exercise: Eats out of  the garden, walks and rides her her horse.     Relevant past medical, surgical, family and social history reviewed and updated as indicated. Interim medical history since our last visit reviewed. Allergies and medications reviewed and updated.  Review of Systems  Constitutional: Negative.   HENT: Negative.    Eyes: Negative.   Respiratory: Negative.    Cardiovascular: Negative.   Gastrointestinal: Negative.   Endocrine: Negative.   Genitourinary: Negative.   Musculoskeletal: Negative.   Skin: Negative.   Allergic/Immunologic: Negative.   Neurological: Negative.   Hematological: Negative.   Psychiatric/Behavioral: Negative.      Per HPI unless specifically indicated above     Objective:    BP (!) 144/76 (BP Location: Left Arm, Cuff Size: Normal)   Pulse 64   Temp 98.6 F (37 C) (Oral)   Ht 5' 2.7" (1.593 m)   Wt 130 lb 9.6 oz (59.2 kg)   SpO2 97%   BMI 23.36 kg/m   Wt Readings from Last 3 Encounters:  03/15/22 130 lb 9.6 oz (59.2  kg)  11/17/21 130 lb 6.4 oz (59.1 kg)  09/14/21 133 lb (60.3 kg)    Physical Exam Constitutional:      General: She is not in acute distress.    Appearance: Normal appearance. She is well-developed.  HENT:     Head: Normocephalic and atraumatic.  Eyes:     General: Lids are normal. No scleral icterus.       Right eye: No discharge.        Left eye: No discharge.     Conjunctiva/sclera: Conjunctivae normal.  Neck:     Vascular: No carotid bruit or JVD.  Cardiovascular:     Rate and Rhythm: Normal rate and regular rhythm.     Heart sounds: Normal heart sounds.  Pulmonary:     Effort: Pulmonary effort is normal. No respiratory distress.     Breath sounds: Normal breath sounds.  Abdominal:     Palpations: There is no hepatomegaly or splenomegaly.  Musculoskeletal:        General: Normal range of motion.     Cervical back: Normal range of motion and neck supple.  Skin:    General: Skin is warm and dry.     Coloration: Skin is not  pale.     Findings: No rash.  Neurological:     Mental Status: She is alert and oriented to person, place, and time.  Psychiatric:        Behavior: Behavior normal.        Thought Content: Thought content normal.        Judgment: Judgment normal.     Results for orders placed or performed in visit on 11/17/21  Comprehensive metabolic panel  Result Value Ref Range   Glucose 86 70 - 99 mg/dL   BUN 13 8 - 27 mg/dL   Creatinine, Ser 1.06 (H) 0.57 - 1.00 mg/dL   eGFR 54 (L) >59 mL/min/1.73   BUN/Creatinine Ratio 12 12 - 28   Sodium 138 134 - 144 mmol/L   Potassium 4.0 3.5 - 5.2 mmol/L   Chloride 99 96 - 106 mmol/L   CO2 22 20 - 29 mmol/L   Calcium 9.8 8.7 - 10.3 mg/dL   Total Protein 6.9 6.0 - 8.5 g/dL   Albumin 4.5 3.7 - 4.7 g/dL   Globulin, Total 2.4 1.5 - 4.5 g/dL   Albumin/Globulin Ratio 1.9 1.2 - 2.2   Bilirubin Total 0.6 0.0 - 1.2 mg/dL   Alkaline Phosphatase 73 44 - 121 IU/L   AST 22 0 - 40 IU/L   ALT 15 0 - 32 IU/L  CBC with Differential/Platelet  Result Value Ref Range   WBC 4.8 3.4 - 10.8 x10E3/uL   RBC 5.13 3.77 - 5.28 x10E6/uL   Hemoglobin 14.4 11.1 - 15.9 g/dL   Hematocrit 43.2 34.0 - 46.6 %   MCV 84 79 - 97 fL   MCH 28.1 26.6 - 33.0 pg   MCHC 33.3 31.5 - 35.7 g/dL   RDW 11.9 11.7 - 15.4 %   Platelets 187 150 - 450 x10E3/uL   Neutrophils 51 Not Estab. %   Lymphs 37 Not Estab. %   Monocytes 10 Not Estab. %   Eos 1 Not Estab. %   Basos 1 Not Estab. %   Neutrophils Absolute 2.5 1.4 - 7.0 x10E3/uL   Lymphocytes Absolute 1.8 0.7 - 3.1 x10E3/uL   Monocytes Absolute 0.5 0.1 - 0.9 x10E3/uL   EOS (ABSOLUTE) 0.1 0.0 - 0.4 x10E3/uL   Basophils Absolute 0.0  0.0 - 0.2 x10E3/uL   Immature Granulocytes 0 Not Estab. %   Immature Grans (Abs) 0.0 0.0 - 0.1 x10E3/uL  Urinalysis, Routine w reflex microscopic  Result Value Ref Range   Specific Gravity, UA 1.010 1.005 - 1.030   pH, UA 6.5 5.0 - 7.5   Color, UA Yellow Yellow   Appearance Ur Clear Clear   Leukocytes,UA  Negative Negative   Protein,UA Negative Negative/Trace   Glucose, UA Negative Negative   Ketones, UA Negative Negative   RBC, UA Negative Negative   Bilirubin, UA Negative Negative   Urobilinogen, Ur 0.2 0.2 - 1.0 mg/dL   Nitrite, UA Negative Negative  TSH  Result Value Ref Range   TSH 1.080 0.450 - 4.500 uIU/mL  Lipid panel  Result Value Ref Range   Cholesterol, Total 181 100 - 199 mg/dL   Triglycerides 92 0 - 149 mg/dL   HDL 76 >39 mg/dL   VLDL Cholesterol Cal 17 5 - 40 mg/dL   LDL Chol Calc (NIH) 88 0 - 99 mg/dL   Chol/HDL Ratio 2.4 0.0 - 4.4 ratio      Assessment & Plan:   Problem List Items Addressed This Visit       Unprioritized   Depression    Rx for Mirtazepine 15 mg QHS.  Hopefully this will help with both sleep and depression. Will put back on Wellbutrin if that doesn't work.       Relevant Medications   mirtazapine (REMERON SOL-TAB) 15 MG disintegrating tablet   Hyperlipidemia    Stable, continue present medications.        Hypertension    Stable, continue present medications.        Insomnia    Discussed that Unisom and Lunesta on [employer].  Start Mirtazepine 15 mg to help sleep and depression.  Try 2-3 mg of melatonin.  Book for CBT for sleep recommended.          Follow up plan: Recheck in 4 weeks and needs physical in 3 months

## 2022-03-15 NOTE — Patient Instructions (Addendum)
Say goodnight to insomnia - Ardis Hughs   Take 2-3 mg of melatonin  Magnesium Gluconate

## 2022-03-16 DIAGNOSIS — N1831 Chronic kidney disease, stage 3a: Secondary | ICD-10-CM | POA: Diagnosis not present

## 2022-03-16 DIAGNOSIS — I129 Hypertensive chronic kidney disease with stage 1 through stage 4 chronic kidney disease, or unspecified chronic kidney disease: Secondary | ICD-10-CM | POA: Diagnosis not present

## 2022-03-31 ENCOUNTER — Other Ambulatory Visit: Payer: Self-pay | Admitting: Unknown Physician Specialty

## 2022-03-31 DIAGNOSIS — Z1231 Encounter for screening mammogram for malignant neoplasm of breast: Secondary | ICD-10-CM

## 2022-04-04 ENCOUNTER — Ambulatory Visit
Admission: RE | Admit: 2022-04-04 | Discharge: 2022-04-04 | Disposition: A | Discharge status: Home / Self Care | Payer: PPO | Source: Ambulatory Visit | Attending: Unknown Physician Specialty | Admitting: Unknown Physician Specialty

## 2022-04-04 ENCOUNTER — Other Ambulatory Visit: Payer: Self-pay | Admitting: Nurse Practitioner

## 2022-04-04 DIAGNOSIS — Z1231 Encounter for screening mammogram for malignant neoplasm of breast: Secondary | ICD-10-CM | POA: Insufficient documentation

## 2022-04-05 NOTE — Telephone Encounter (Signed)
Requested medication (s) are due for refill today - no  Requested medication (s) are on the active medication list -yes  Future visit scheduled -yes  Last refill: 12/14/21 #90 1RF  Notes to clinic: Dr Neomia Dear Rx- should not need RF yet  Requested Prescriptions  Pending Prescriptions Disp Refills   amLODipine-benazepril (LOTREL) 10-20 MG capsule [Pharmacy Med Name: AMLOD/BENAZEPRIL 10/20MG] 90 capsule 0    Sig: Take 1 capsule by mouth daily.     Cardiovascular: CCB + ACEI Combos Failed - 04/04/2022  9:51 AM      Failed - Cr in normal range and within 180 days    Creatinine  Date Value Ref Range Status  08/11/2020 76.6 20.0 - 300.0 mg/dL Final   Creatinine, Ser  Date Value Ref Range Status  11/17/2021 1.06 (H) 0.57 - 1.00 mg/dL Final         Failed - Last BP in normal range    BP Readings from Last 1 Encounters:  03/15/22 (!) 144/76         Passed - K in normal range and within 180 days    Potassium  Date Value Ref Range Status  11/17/2021 4.0 3.5 - 5.2 mmol/L Final         Passed - Na in normal range and within 180 days    Sodium  Date Value Ref Range Status  11/17/2021 138 134 - 144 mmol/L Final         Passed - eGFR is 30 or above and within 180 days    GFR calc Af Amer  Date Value Ref Range Status  08/11/2020 59 (L) >59 mL/min/1.73 Final    Comment:    **In accordance with recommendations from the NKF-ASN Task force,**   [facility] is in the process of updating its eGFR calculation to the   2021 CKD-EPI creatinine equation that estimates kidney function   without a race variable.    GFR calc non Af Amer  Date Value Ref Range Status  08/11/2020 51 (L) >59 mL/min/1.73 Final   eGFR  Date Value Ref Range Status  11/17/2021 54 (L) >59 mL/min/1.73 Final         Passed - Patient is not pregnant      Passed - Valid encounter within last 6 months    Recent Outpatient Visits           3 weeks ago Psychophysiological insomnia   [place] Kathrine Haddock, NP   4 months ago Primary hypertension   [place] Vigg, Avanti, MD   5 months ago Right hip pain   [facility] Vigg, Avanti, MD   6 months ago Need for influenza vaccination   [place] Vigg, Avanti, MD   8 months ago Cardiac murmur   [place], MD       Future Appointments             In 1 week Kathrine Haddock, NP [place]               Requested Prescriptions  Pending Prescriptions Disp Refills   amLODipine-benazepril (LOTREL) 10-20 MG capsule [Pharmacy Med Name: AMLOD/BENAZEPRIL 10/20MG] 90 capsule 0    Sig: Take 1 capsule by mouth daily.     Cardiovascular: CCB + ACEI Combos Failed - 04/04/2022  9:51 AM      Failed - Cr in normal range and within 180 days    Creatinine  Date Value Ref  Range Status  08/11/2020 76.6 20.0 - 300.0 mg/dL Final   Creatinine, Ser  Date Value Ref Range Status  11/17/2021 1.06 (H) 0.57 - 1.00 mg/dL Final         Failed - Last BP in normal range    BP Readings from Last 1 Encounters:  03/15/22 (!) 144/76         Passed - K in normal range and within 180 days    Potassium  Date Value Ref Range Status  11/17/2021 4.0 3.5 - 5.2 mmol/L Final         Passed - Na in normal range and within 180 days    Sodium  Date Value Ref Range Status  11/17/2021 138 134 - 144 mmol/L Final         Passed - eGFR is 30 or above and within 180 days    GFR calc Af Amer  Date Value Ref Range Status  08/11/2020 59 (L) >59 mL/min/1.73 Final    Comment:    **In accordance with recommendations from the NKF-ASN Task force,**   [facility] is in the process of updating its eGFR calculation to the   2021 CKD-EPI creatinine equation that estimates kidney function   without a race variable.    GFR calc non Af Amer  Date Value Ref Range Status  08/11/2020 51 (L) >59 mL/min/1.73 Final   eGFR  Date Value Ref Range Status  11/17/2021 54 (L) >59 mL/min/1.73  Final         Passed - Patient is not pregnant      Passed - Valid encounter within last 6 months    Recent Outpatient Visits           3 weeks ago Psychophysiological insomnia   [place] Kathrine Haddock, NP   4 months ago Primary hypertension   [place] Vigg, Avanti, MD   5 months ago Right hip pain   [facility] Vigg, Avanti, MD   6 months ago Need for influenza vaccination   [place] Vigg, Avanti, MD   8 months ago Cardiac murmur   [place], MD       Future Appointments             In 1 week Kathrine Haddock, NP [facility], [place]

## 2022-04-06 ENCOUNTER — Other Ambulatory Visit: Payer: Self-pay | Admitting: Nurse Practitioner

## 2022-04-07 ENCOUNTER — Telehealth: Payer: Self-pay

## 2022-04-07 MED ORDER — AMLODIPINE BESY-BENAZEPRIL HCL 10-20 MG PO CAPS
1.0000 | ORAL_CAPSULE | Freq: Every day | ORAL | 4 refills | Status: DC
Start: 2022-04-07 — End: 2023-07-05

## 2022-04-07 NOTE — Telephone Encounter (Signed)
Received a fax for prescription refill for Amlodipine/Benazepril. Patient prescription was last written back in April #90 with 1 refill. Please advise?

## 2022-04-07 NOTE — Telephone Encounter (Signed)
rx was sent to pharmacy on 12/14/21 #90/1 Requested Prescriptions  Pending Prescriptions Disp Refills  . amLODipine-benazepril (LOTREL) 10-20 MG capsule [Pharmacy Med Name: AMLOD/BENAZEPRIL 10/20MG ] 90 capsule 0    Sig: Take 1 capsule by mouth daily.     Cardiovascular: CCB + ACEI Combos Failed - 04/06/2022  3:04 PM      Failed - Cr in normal range and within 180 days    Creatinine  Date Value Ref Range Status  08/11/2020 76.6 20.0 - 300.0 mg/dL Final   Creatinine, Ser  Date Value Ref Range Status  11/17/2021 1.06 (H) 0.57 - 1.00 mg/dL Final         Failed - Last BP in normal range    BP Readings from Last 1 Encounters:  03/15/22 (!) 144/76         Passed - K in normal range and within 180 days    Potassium  Date Value Ref Range Status  11/17/2021 4.0 3.5 - 5.2 mmol/L Final         Passed - Na in normal range and within 180 days    Sodium  Date Value Ref Range Status  11/17/2021 138 134 - 144 mmol/L Final         Passed - eGFR is 30 or above and within 180 days    GFR calc Af Amer  Date Value Ref Range Status  08/11/2020 59 (L) >59 mL/min/1.73 Final    Comment:    **In accordance with recommendations from the NKF-ASN Task force,**   [facility] is in the process of updating its eGFR calculation to the   2021 CKD-EPI creatinine equation that estimates kidney function   without a race variable.    GFR calc non Af Amer  Date Value Ref Range Status  08/11/2020 51 (L) >59 mL/min/1.73 Final   eGFR  Date Value Ref Range Status  11/17/2021 54 (L) >59 mL/min/1.73 Final         Passed - Patient is not pregnant      Passed - Valid encounter within last 6 months    Recent Outpatient Visits          3 weeks ago Psychophysiological insomnia   [place] Kathrine Haddock, NP   4 months ago Primary hypertension   [place] Vigg, Avanti, MD   5 months ago Right hip pain   [facility] Vigg, Avanti, MD   6 months ago Need for influenza  vaccination   [place] Vigg, Avanti, MD   8 months ago Cardiac murmur   [place], MD      Future Appointments            In 5 days Kathrine Haddock, NP [facility], [place]

## 2022-04-12 ENCOUNTER — Encounter: Payer: Self-pay | Admitting: Unknown Physician Specialty

## 2022-04-12 ENCOUNTER — Ambulatory Visit (INDEPENDENT_AMBULATORY_CARE_PROVIDER_SITE_OTHER): Payer: PPO | Admitting: Unknown Physician Specialty

## 2022-04-12 DIAGNOSIS — F3342 Major depressive disorder, recurrent, in full remission: Secondary | ICD-10-CM

## 2022-04-12 DIAGNOSIS — F5104 Psychophysiologic insomnia: Secondary | ICD-10-CM | POA: Diagnosis not present

## 2022-04-12 MED ORDER — BUPROPION HCL ER (XL) 300 MG PO TB24: 300.0000 mg | ORAL_TABLET | Freq: Every day | ORAL | 1 refills | Status: DC

## 2022-04-12 NOTE — Patient Instructions (Addendum)
Ashwaghanda  Online:  [email or web address]

## 2022-04-12 NOTE — Progress Notes (Signed)
BP 132/82   Pulse 61   Temp 98.6 F (37 C) (Oral)   Ht 5' 2.72" (1.593 m)   Wt 131 lb 14.4 oz (59.8 kg)   SpO2 98%   BMI 23.58 kg/m    Subjective:    Patient ID: Lindsey Blair, female    DOB: 06-13-1946, 76 y.o.   MRN: [id number]  HPI: Lindsey Blair is a 76 y.o. female  Chief Complaint  Patient presents with   Psychophysiological Insomnia    F/U, patient states that the new med that was prescribed as last visit she is not taking, made her feel to bad in the morning time. Patient states that she does better without taking it.   Pt is here to f/u depression and insomnia.  Last visit started Mirtazepine 15 mg.  Stopped this as she felt it the next day.  States she is doing well with sleep after taking Melatonin 3 mg (tried 10 mg in the past).    Depression- Would like restart Wellbutrin.  PHQ is OK but she is having crying spells and is "too emotional."       04/12/2022   10:26 AM 03/15/2022   11:02 AM 11/17/2021   11:41 AM 09/14/2021    2:04 PM 08/11/2021    9:28 AM  Depression screen PHQ 2/9  Decreased Interest 0 1 0 0 0  Down, Depressed, Hopeless 0 0 0 0 0  PHQ - 2 Score 0 1 0 0 0  Altered sleeping _0 0  Tired, decreased energy 0 1 0 1 0  Change in appetite 0 1 0 0 0  Feeling bad or failure about yourself  0 1 0 0 0  Trouble concentrating 0 1 0 1 0  Moving slowly or fidgety/restless 0 1 0 0 0  Suicidal thoughts  0 0 0 0  PHQ-9 Score _1 0  Difficult doing work/chores Not difficult at all Somewhat difficult Not difficult at all  Not difficult at all     Relevant past medical, surgical, family and social history reviewed and updated as indicated. Interim medical history since our last visit reviewed. Allergies and medications reviewed and updated.  Review of Systems  Per HPI unless specifically indicated above     Objective:    BP 132/82   Pulse 61   Temp 98.6 F (37 C) (Oral)   Ht 5' 2.72" (1.593 m)   Wt 131 lb 14.4 oz (59.8 kg)   SpO2 98%    BMI 23.58 kg/m   Wt Readings from Last 3 Encounters:  04/12/22 131 lb 14.4 oz (59.8 kg)  03/15/22 130 lb 9.6 oz (59.2 kg)  11/17/21 130 lb 6.4 oz (59.1 kg)    Physical Exam Constitutional:      General: She is not in acute distress.    Appearance: Normal appearance. She is well-developed.  HENT:     Head: Normocephalic and atraumatic.  Eyes:     General: Lids are normal. No scleral icterus.       Right eye: No discharge.        Left eye: No discharge.     Conjunctiva/sclera: Conjunctivae normal.  Neck:     Vascular: No carotid bruit or JVD.  Cardiovascular:     Rate and Rhythm: Normal rate and regular rhythm.     Heart sounds: Normal heart sounds.  Pulmonary:     Effort: Pulmonary effort is normal. No respiratory distress.  Breath sounds: Normal breath sounds.  Abdominal:     Palpations: There is no hepatomegaly or splenomegaly.  Musculoskeletal:        General: Normal range of motion.     Cervical back: Normal range of motion and neck supple.  Skin:    General: Skin is warm and dry.     Coloration: Skin is not pale.     Findings: No rash.  Neurological:     Mental Status: She is alert and oriented to person, place, and time.  Psychiatric:        Behavior: Behavior normal.        Thought Content: Thought content normal.        Judgment: Judgment normal.     Results for orders placed or performed in visit on 11/17/21  Comprehensive metabolic panel  Result Value Ref Range   Glucose 86 70 - 99 mg/dL   BUN 13 8 - 27 mg/dL   Creatinine, Ser 1.06 (H) 0.57 - 1.00 mg/dL   eGFR 54 (L) >59 mL/min/1.73   BUN/Creatinine Ratio 12 12 - 28   Sodium 138 134 - 144 mmol/L   Potassium 4.0 3.5 - 5.2 mmol/L   Chloride 99 96 - 106 mmol/L   CO2 22 20 - 29 mmol/L   Calcium 9.8 8.7 - 10.3 mg/dL   Total Protein 6.9 6.0 - 8.5 g/dL   Albumin 4.5 3.7 - 4.7 g/dL   Globulin, Total 2.4 1.5 - 4.5 g/dL   Albumin/Globulin Ratio 1.9 1.2 - 2.2   Bilirubin Total 0.6 0.0 - 1.2 mg/dL    Alkaline Phosphatase 73 44 - 121 IU/L   AST 22 0 - 40 IU/L   ALT 15 0 - 32 IU/L  CBC with Differential/Platelet  Result Value Ref Range   WBC 4.8 3.4 - 10.8 x10E3/uL   RBC 5.13 3.77 - 5.28 x10E6/uL   Hemoglobin 14.4 11.1 - 15.9 g/dL   Hematocrit 43.2 34.0 - 46.6 %   MCV 84 79 - 97 fL   MCH 28.1 26.6 - 33.0 pg   MCHC 33.3 31.5 - 35.7 g/dL   RDW 11.9 11.7 - 15.4 %   Platelets 187 150 - 450 x10E3/uL   Neutrophils 51 Not Estab. %   Lymphs 37 Not Estab. %   Monocytes 10 Not Estab. %   Eos 1 Not Estab. %   Basos 1 Not Estab. %   Neutrophils Absolute 2.5 1.4 - 7.0 x10E3/uL   Lymphocytes Absolute 1.8 0.7 - 3.1 x10E3/uL   Monocytes Absolute 0.5 0.1 - 0.9 x10E3/uL   EOS (ABSOLUTE) 0.1 0.0 - 0.4 x10E3/uL   Basophils Absolute 0.0 0.0 - 0.2 x10E3/uL   Immature Granulocytes 0 Not Estab. %   Immature Grans (Abs) 0.0 0.0 - 0.1 x10E3/uL  Urinalysis, Routine w reflex microscopic  Result Value Ref Range   Specific Gravity, UA 1.010 1.005 - 1.030   pH, UA 6.5 5.0 - 7.5   Color, UA Yellow Yellow   Appearance Ur Clear Clear   Leukocytes,UA Negative Negative   Protein,UA Negative Negative/Trace   Glucose, UA Negative Negative   Ketones, UA Negative Negative   RBC, UA Negative Negative   Bilirubin, UA Negative Negative   Urobilinogen, Ur 0.2 0.2 - 1.0 mg/dL   Nitrite, UA Negative Negative  TSH  Result Value Ref Range   TSH 1.080 0.450 - 4.500 uIU/mL  Lipid panel  Result Value Ref Range   Cholesterol, Total 181 100 - 199 mg/dL   Triglycerides  92 0 - 149 mg/dL   HDL 76 >39 mg/dL   VLDL Cholesterol Cal 17 5 - 40 mg/dL   LDL Chol Calc (NIH) 88 0 - 99 mg/dL   Chol/HDL Ratio 2.4 0.0 - 4.4 ratio      Assessment & Plan:   Problem List Items Addressed This Visit       Unprioritized   Depression    Restart Wellbutrin 300 mg XR daily..        Relevant Medications   buPROPion (WELLBUTRIN XL) 300 MG 24 hr tablet   Insomnia    Better with melatonin 3 mg.  Has Lunesta still but OK to take  prn        Follow up plan: Return in about 4 weeks (around 05/10/2022).

## 2022-04-12 NOTE — Assessment & Plan Note (Addendum)
Better with melatonin 3 mg.  Has Lunesta still but OK to take prn

## 2022-04-12 NOTE — Assessment & Plan Note (Signed)
Restart Wellbutrin 300 mg XR daily.Lindsey Blair

## 2022-05-17 ENCOUNTER — Ambulatory Visit (INDEPENDENT_AMBULATORY_CARE_PROVIDER_SITE_OTHER): Payer: PPO | Admitting: Unknown Physician Specialty

## 2022-05-17 ENCOUNTER — Encounter: Payer: Self-pay | Admitting: Unknown Physician Specialty

## 2022-05-17 DIAGNOSIS — F3342 Major depressive disorder, recurrent, in full remission: Secondary | ICD-10-CM | POA: Diagnosis not present

## 2022-05-17 MED ORDER — BUPROPION HCL ER (SR) 150 MG PO TB12: 150.0000 mg | ORAL_TABLET | Freq: Two times a day (BID) | ORAL | 1 refills | Status: DC

## 2022-05-17 NOTE — Progress Notes (Signed)
BP 133/70   Pulse (!) 59   Temp 98.7 F (37.1 C) (Oral)   Wt 131 lb 3.2 oz (59.5 kg)   SpO2 98%   BMI 23.45 kg/m    Subjective:    Patient ID: Lindsey Blair, female    DOB: 09/05/1946, 76 y.o.   MRN: [id number]  HPI: Lindsey Blair is a 76 y.o. female  Chief Complaint  Patient presents with   Insomnia    4 week f/up   Insomnia - Currently taking Melatonin 3 mg.  Taking Lunesta prn about 1-2 times/week.  States behavioral changes of not trying to go to bed until 10-11p rather than earlier.    Pt states her mood isn't quite where she wants it to be.  Previously she was taking 150 mg BID      05/17/2022   11:00 AM 04/12/2022   10:26 AM 03/15/2022   11:02 AM 11/17/2021   11:41 AM 09/14/2021    2:04 PM  Depression screen PHQ 2/9  Decreased Interest 0 0 1 0 0  Down, Depressed, Hopeless 0 0 0 0 0  PHQ - 2 Score 0 0 1 0 0  Altered sleeping $RemoveBeforeDE'1 1 2 2 2  'qUVAAtbuPUFHUoB$ Tired, decreased energy 1 0 1 0 1  Change in appetite 0 0 1 0 0  Feeling bad or failure about yourself  0 0 1 0 0  Trouble concentrating 0 0 1 0 1  Moving slowly or fidgety/restless 0 0 1 0 0  Suicidal thoughts 0  0 0 0  PHQ-9 Score $RemoveBef'2 1 8 2 4  'RmVRsZGOYo$ Difficult doing work/chores Not difficult at all Not difficult at all Somewhat difficult Not difficult at all     Relevant past medical, surgical, family and social history reviewed and updated as indicated. Interim medical history since our last visit reviewed. Allergies and medications reviewed and updated.  Review of Systems  Constitutional: Negative.   Respiratory: Negative.    Cardiovascular: Negative.   Psychiatric/Behavioral: Negative.      Per HPI unless specifically indicated above     Objective:    BP 133/70   Pulse (!) 59   Temp 98.7 F (37.1 C) (Oral)   Wt 131 lb 3.2 oz (59.5 kg)   SpO2 98%   BMI 23.45 kg/m   Wt Readings from Last 3 Encounters:  05/17/22 131 lb 3.2 oz (59.5 kg)  04/12/22 131 lb 14.4 oz (59.8 kg)  03/15/22 130 lb 9.6 oz (59.2 kg)     Physical Exam Constitutional:      General: She is not in acute distress.    Appearance: Normal appearance. She is well-developed.  HENT:     Head: Normocephalic and atraumatic.  Eyes:     General: Lids are normal. No scleral icterus.       Right eye: No discharge.        Left eye: No discharge.     Conjunctiva/sclera: Conjunctivae normal.  Neck:     Vascular: No carotid bruit or JVD.  Cardiovascular:     Rate and Rhythm: Normal rate and regular rhythm.     Heart sounds: Normal heart sounds.  Pulmonary:     Effort: Pulmonary effort is normal.     Breath sounds: Normal breath sounds.  Abdominal:     Palpations: There is no hepatomegaly or splenomegaly.  Musculoskeletal:        General: Normal range of motion.     Cervical back: Normal range of motion and  neck supple.  Skin:    General: Skin is warm and dry.     Coloration: Skin is not pale.     Findings: No rash.  Neurological:     Mental Status: She is alert and oriented to person, place, and time.  Psychiatric:        Behavior: Behavior normal.        Thought Content: Thought content normal.        Judgment: Judgment normal.     Results for orders placed or performed in visit on 11/17/21  Comprehensive metabolic panel  Result Value Ref Range   Glucose 86 70 - 99 mg/dL   BUN 13 8 - 27 mg/dL   Creatinine, Ser 8.44 (H) 0.57 - 1.00 mg/dL   eGFR 54 (L) >90 EQ/GSY/5.18   BUN/Creatinine Ratio 12 12 - 28   Sodium 138 134 - 144 mmol/L   Potassium 4.0 3.5 - 5.2 mmol/L   Chloride 99 96 - 106 mmol/L   CO2 22 20 - 29 mmol/L   Calcium 9.8 8.7 - 10.3 mg/dL   Total Protein 6.9 6.0 - 8.5 g/dL   Albumin 4.5 3.7 - 4.7 g/dL   Globulin, Total 2.4 1.5 - 4.5 g/dL   Albumin/Globulin Ratio 1.9 1.2 - 2.2   Bilirubin Total 0.6 0.0 - 1.2 mg/dL   Alkaline Phosphatase 73 44 - 121 IU/L   AST 22 0 - 40 IU/L   ALT 15 0 - 32 IU/L  CBC with Differential/Platelet  Result Value Ref Range   WBC 4.8 3.4 - 10.8 x10E3/uL   RBC 5.13 3.77 - 5.28  x10E6/uL   Hemoglobin 14.4 11.1 - 15.9 g/dL   Hematocrit 43.5 91.7 - 46.6 %   MCV 84 79 - 97 fL   MCH 28.1 26.6 - 33.0 pg   MCHC 33.3 31.5 - 35.7 g/dL   RDW 86.7 72.1 - 09.6 %   Platelets 187 150 - 450 x10E3/uL   Neutrophils 51 Not Estab. %   Lymphs 37 Not Estab. %   Monocytes 10 Not Estab. %   Eos 1 Not Estab. %   Basos 1 Not Estab. %   Neutrophils Absolute 2.5 1.4 - 7.0 x10E3/uL   Lymphocytes Absolute 1.8 0.7 - 3.1 x10E3/uL   Monocytes Absolute 0.5 0.1 - 0.9 x10E3/uL   EOS (ABSOLUTE) 0.1 0.0 - 0.4 x10E3/uL   Basophils Absolute 0.0 0.0 - 0.2 x10E3/uL   Immature Granulocytes 0 Not Estab. %   Immature Grans (Abs) 0.0 0.0 - 0.1 x10E3/uL  Urinalysis, Routine w reflex microscopic  Result Value Ref Range   Specific Gravity, UA 1.010 1.005 - 1.030   pH, UA 6.5 5.0 - 7.5   Color, UA Yellow Yellow   Appearance Ur Clear Clear   Leukocytes,UA Negative Negative   Protein,UA Negative Negative/Trace   Glucose, UA Negative Negative   Ketones, UA Negative Negative   RBC, UA Negative Negative   Bilirubin, UA Negative Negative   Urobilinogen, Ur 0.2 0.2 - 1.0 mg/dL   Nitrite, UA Negative Negative  TSH  Result Value Ref Range   TSH 1.080 0.450 - 4.500 uIU/mL  Lipid panel  Result Value Ref Range   Cholesterol, Total 181 100 - 199 mg/dL   Triglycerides 92 0 - 149 mg/dL   HDL 76 >79 mg/dL   VLDL Cholesterol Cal 17 5 - 40 mg/dL   LDL Chol Calc (NIH) 88 0 - 99 mg/dL   Chol/HDL Ratio 2.4 0.0 - 4.4 ratio  Assessment & Plan:   Problem List Items Addressed This Visit       Unprioritized   Depression    Not to goal.  Will go back to the previous formulation of 150 mg BID SR rather than 300 mg XR to see if that works better      Relevant Medications   buPROPion (WELLBUTRIN SR) 150 MG 12 hr tablet     Follow up plan: Return if symptoms worsen or fail to improve.

## 2022-05-17 NOTE — Assessment & Plan Note (Signed)
Not to goal.  Will go back to the previous formulation of 150 mg BID SR rather than 300 mg XR to see if that works better

## 2022-05-17 NOTE — Patient Instructions (Addendum)
Vit D 2,000 IUs/day  Magnesium Glycinate

## 2022-05-23 DIAGNOSIS — D2272 Melanocytic nevi of left lower limb, including hip: Secondary | ICD-10-CM | POA: Diagnosis not present

## 2022-05-23 DIAGNOSIS — L821 Other seborrheic keratosis: Secondary | ICD-10-CM | POA: Diagnosis not present

## 2022-05-23 DIAGNOSIS — D2262 Melanocytic nevi of left upper limb, including shoulder: Secondary | ICD-10-CM | POA: Diagnosis not present

## 2022-05-23 DIAGNOSIS — D225 Melanocytic nevi of trunk: Secondary | ICD-10-CM | POA: Diagnosis not present

## 2022-05-23 DIAGNOSIS — L931 Subacute cutaneous lupus erythematosus: Secondary | ICD-10-CM | POA: Diagnosis not present

## 2022-05-23 DIAGNOSIS — D2261 Melanocytic nevi of right upper limb, including shoulder: Secondary | ICD-10-CM | POA: Diagnosis not present

## 2022-05-23 DIAGNOSIS — Z79899 Other long term (current) drug therapy: Secondary | ICD-10-CM | POA: Diagnosis not present

## 2022-05-23 DIAGNOSIS — D2271 Melanocytic nevi of right lower limb, including hip: Secondary | ICD-10-CM | POA: Diagnosis not present

## 2022-05-26 DIAGNOSIS — Z79899 Other long term (current) drug therapy: Secondary | ICD-10-CM | POA: Diagnosis not present

## 2022-06-20 ENCOUNTER — Other Ambulatory Visit: Payer: Self-pay | Admitting: Nurse Practitioner

## 2022-06-21 NOTE — Telephone Encounter (Signed)
Requested Prescriptions  Pending Prescriptions Disp Refills  . pantoprazole (PROTONIX) 40 MG tablet [Pharmacy Med Name: PANTOPRAZOLE SOD DR 40 MG TAB] 90 tablet 3    Sig: Take 1 tablet (40 mg total) by mouth daily.     Gastroenterology: Proton Pump Inhibitors Passed - 06/20/2022 10:26 AM      Passed - Valid encounter within last 12 months    Recent Outpatient Visits          1 month ago Recurrent major depressive disorder, in full remission ([place])   [place] Kathrine Haddock, NP   2 months ago Psychophysiological insomnia   [place] Kathrine Haddock, NP   3 months ago Psychophysiological insomnia   [place] Kathrine Haddock, NP   7 months ago Primary hypertension   [facility] Vigg, Avanti, MD   8 months ago Right hip pain   [facility] Vigg, Loman [place], MD

## 2022-06-27 ENCOUNTER — Other Ambulatory Visit: Payer: Self-pay

## 2022-06-27 MED ORDER — TESTOSTERONE 1.62 % TD GEL: 1.0000 "application " | TRANSDERMAL | 2 refills | Status: DC

## 2022-06-27 NOTE — Telephone Encounter (Signed)
Medication refill for testosterone 4% gel last ov 05/17/22, upcoming ov no future . Please advise

## 2022-06-28 ENCOUNTER — Other Ambulatory Visit: Payer: Self-pay | Admitting: Nurse Practitioner

## 2022-06-28 MED ORDER — TESTOSTERONE 1.62 % TD GEL: 1.0000 "application " | TRANSDERMAL | 2 refills | Status: DC

## 2022-07-01 DIAGNOSIS — F3342 Major depressive disorder, recurrent, in full remission: Secondary | ICD-10-CM | POA: Diagnosis not present

## 2022-07-01 DIAGNOSIS — M810 Age-related osteoporosis without current pathological fracture: Secondary | ICD-10-CM | POA: Diagnosis not present

## 2022-07-01 DIAGNOSIS — G4700 Insomnia, unspecified: Secondary | ICD-10-CM | POA: Diagnosis not present

## 2022-07-01 DIAGNOSIS — K219 Gastro-esophageal reflux disease without esophagitis: Secondary | ICD-10-CM | POA: Diagnosis not present

## 2022-07-01 DIAGNOSIS — G8929 Other chronic pain: Secondary | ICD-10-CM | POA: Diagnosis not present

## 2022-07-01 DIAGNOSIS — N1831 Chronic kidney disease, stage 3a: Secondary | ICD-10-CM | POA: Diagnosis not present

## 2022-07-01 DIAGNOSIS — M3210 Systemic lupus erythematosus, organ or system involvement unspecified: Secondary | ICD-10-CM | POA: Diagnosis not present

## 2022-07-01 DIAGNOSIS — I10 Essential (primary) hypertension: Secondary | ICD-10-CM | POA: Diagnosis not present

## 2022-07-01 DIAGNOSIS — Z7982 Long term (current) use of aspirin: Secondary | ICD-10-CM | POA: Diagnosis not present

## 2022-07-01 DIAGNOSIS — Z87891 Personal history of nicotine dependence: Secondary | ICD-10-CM | POA: Diagnosis not present

## 2022-07-01 DIAGNOSIS — D84821 Immunodeficiency due to drugs: Secondary | ICD-10-CM | POA: Diagnosis not present

## 2022-07-01 DIAGNOSIS — E785 Hyperlipidemia, unspecified: Secondary | ICD-10-CM | POA: Diagnosis not present

## 2022-07-13 ENCOUNTER — Encounter: Payer: Self-pay | Admitting: Podiatry

## 2022-07-13 ENCOUNTER — Ambulatory Visit (INDEPENDENT_AMBULATORY_CARE_PROVIDER_SITE_OTHER): Payer: PPO | Admitting: Podiatry

## 2022-07-13 DIAGNOSIS — B351 Tinea unguium: Secondary | ICD-10-CM | POA: Diagnosis not present

## 2022-07-13 DIAGNOSIS — L603 Nail dystrophy: Secondary | ICD-10-CM | POA: Diagnosis not present

## 2022-07-13 NOTE — Progress Notes (Signed)
She presents today, Lindsey Blair wife, with a chief concern of darkening of the nail hallux right and absence of growth.  States that she rides horses quite a bit and thinks that her toenail has become damaged possibly due to that or the second toe.  Objective: Vital signs are stable alert and oriented x3.  There is no erythema edema salines drainage odor hallux abductovalgus deformity has resulted in the juxtaposition of the hallux on the second toe which may have caused enough nail dystrophy that the toenail is no longer growing.  The distal aspect of the toe is thickened discolored sort of a brown-green color.  A sample of this was taken today.  Assessment nail dystrophy hallux valgus hammertoe second right.  Plan: Samples of skin and nail were taken today I will follow-up with her in 1 month

## 2022-07-20 ENCOUNTER — Encounter: Payer: Self-pay | Admitting: Nurse Practitioner

## 2022-07-20 ENCOUNTER — Ambulatory Visit (INDEPENDENT_AMBULATORY_CARE_PROVIDER_SITE_OTHER): Payer: PPO | Admitting: Nurse Practitioner

## 2022-07-20 VITALS — BP 136/79 | HR 58 | Temp 98.0°F | Ht 63.2 in | Wt 128.7 lb

## 2022-07-20 DIAGNOSIS — E7800 Pure hypercholesterolemia, unspecified: Secondary | ICD-10-CM

## 2022-07-20 DIAGNOSIS — M85851 Other specified disorders of bone density and structure, right thigh: Secondary | ICD-10-CM | POA: Diagnosis not present

## 2022-07-20 DIAGNOSIS — M329 Systemic lupus erythematosus, unspecified: Secondary | ICD-10-CM

## 2022-07-20 DIAGNOSIS — F5104 Psychophysiologic insomnia: Secondary | ICD-10-CM

## 2022-07-20 DIAGNOSIS — N1831 Chronic kidney disease, stage 3a: Secondary | ICD-10-CM

## 2022-07-20 DIAGNOSIS — N952 Postmenopausal atrophic vaginitis: Secondary | ICD-10-CM | POA: Diagnosis not present

## 2022-07-20 DIAGNOSIS — F3342 Major depressive disorder, recurrent, in full remission: Secondary | ICD-10-CM

## 2022-07-20 DIAGNOSIS — K2100 Gastro-esophageal reflux disease with esophagitis, without bleeding: Secondary | ICD-10-CM | POA: Diagnosis not present

## 2022-07-20 DIAGNOSIS — N951 Menopausal and female climacteric states: Secondary | ICD-10-CM | POA: Diagnosis not present

## 2022-07-20 DIAGNOSIS — I10 Essential (primary) hypertension: Secondary | ICD-10-CM

## 2022-07-20 DIAGNOSIS — Z0000 Encounter for general adult medical examination without abnormal findings: Secondary | ICD-10-CM | POA: Diagnosis not present

## 2022-07-20 DIAGNOSIS — M85852 Other specified disorders of bone density and structure, left thigh: Secondary | ICD-10-CM | POA: Diagnosis not present

## 2022-07-20 LAB — MICROALBUMIN, URINE WAIVED
Creatinine, Urine Waived: 300 mg/dL (ref 10–300)
Microalb, Ur Waived: 30 mg/L — ABNORMAL HIGH (ref 0–19)
Microalb/Creat Ratio: <30 mg/g (ref <30)

## 2022-07-20 MED ORDER — BUPROPION HCL ER (SR) 150 MG PO TB12: 150.0000 mg | ORAL_TABLET | Freq: Three times a day (TID) | ORAL | 4 refills | Status: DC

## 2022-07-20 MED ORDER — ATORVASTATIN CALCIUM 20 MG PO TABS: 20.0000 mg | ORAL_TABLET | Freq: Every day | ORAL | 4 refills | Status: DC

## 2022-07-20 MED ORDER — HYDROCHLOROTHIAZIDE 12.5 MG PO TABS: 12.5000 mg | ORAL_TABLET | Freq: Every day | ORAL | 4 refills | Status: DC

## 2022-07-20 NOTE — Assessment & Plan Note (Signed)
Chronic, stable.  Continue current medication regimen and collaboration with dermatology at [facility].

## 2022-07-20 NOTE — Assessment & Plan Note (Signed)
Chronic, ongoing.  Recommend she slowly cut back on PPI use, which she wishes to trial.  Decrease to every other day for 1-2 weeks, and the every third day, etc in continued pattern until at lowest dose tolerated or discontinued.  Risks of PPI use were discussed with patient including bone loss, C. Diff diarrhea, pneumonia, infections, CKD, electrolyte abnormalities. Verbalizes understanding and chooses to continue the medication.

## 2022-07-20 NOTE — Assessment & Plan Note (Signed)
Chronic, ongoing.  Denies SI/HI.   changed to SR dosing Wellbutrin past visits and been able to sleep better, although is taking 150 MG TID.  On review this is max dosing.  Discussed with her at length.  She notices further benefit from this.  Continue current dosing and adjust as needed.  Refills sent.

## 2022-07-20 NOTE — Assessment & Plan Note (Signed)
Chronic, stable.  Overall improved sleep with change to SR Wellbutrin vs XL and taking this TID.  Continue this and Melatonin as needed.

## 2022-07-20 NOTE — Assessment & Plan Note (Deleted)
Chronic, ongoing.  Continues on Testosterone, started by her previous PCP.  Refills up to date.  Check labs today and consider discontinuation in future, currently remains sexually active and this offers comfort.

## 2022-07-20 NOTE — Assessment & Plan Note (Signed)
Chronic, ongoing.  Last DEXA 02/28/2019, continue supplements at this time.  Plan on repeat DEXA in June 2025.  Recommend reduction of PPI use.

## 2022-07-20 NOTE — Assessment & Plan Note (Signed)
Chronic, ongoing.  Continue current medication regimen and adjust as needed.  Recheck lipid panel today.

## 2022-07-20 NOTE — Progress Notes (Signed)
BP 136/79 (BP Location: Left Arm, Cuff Size: Normal)   Pulse (!) 58   Temp 98 F (36.7 C) (Oral)   Ht 5' 3.2" (1.605 m)   Wt 128 lb 11.2 oz (58.4 kg)   SpO2 96%   BMI 22.65 kg/m    Subjective:    Patient ID: Lindsey Blair, female    DOB: November 05, 1945, 76 y.o.   MRN: [id number]  HPI: Lindsey Blair is a 76 y.o. female presenting on 07/20/2022 for comprehensive medical examination. Current medical complaints include:none  She currently lives with: husband Menopausal Symptoms: no  HYPERTENSION / HYPERLIPIDEMIA Continues on Amlodipine-Benazepril + HCTZ (takes every other day) and Atorvastatin.   Satisfied with current treatment? yes Duration of hypertension: chronic BP monitoring frequency: daily BP range: 120/70 range at home BP medication side effects: no Duration of hyperlipidemia: chronic Cholesterol medication side effects: no Cholesterol supplements: none Medication compliance: good compliance Aspirin: no Recent stressors: no Recurrent headaches: no Visual changes: no Palpitations: no Dyspnea: no Chest pain: no Lower extremity edema: no Dizzy/lightheaded: no   GERD Continues on Protonix 40 MG.  GERD control status: stable Satisfied with current treatment? yes Heartburn frequency: none Medication side effects: no  Medication compliance: stable Dysphagia: no Odynophagia:  no Hematemesis: no Blood in stool: no EGD: yes   CHRONIC KIDNEY DISEASE Followed by nephrology, last visit 03/16/22. CKD status: stable Medications renally dose: yes Previous renal evaluation: yes Pneumovax:  Up to Date Influenza Vaccine:  Up to Date   OSTEOPENIA Last 02/28/2019 -- DEXA was T-score -2.0.  Follows with Dr. Evorn Gong, derm, for Lupus --- continues on Hydroxychloroquine.   Satisfied with current treatment?: yes Adequate calcium & vitamin D: yes Weight bearing exercises: yes   DEPRESSION Has Wellbutrin SR 150 MG BID, although reports sometimes she takes more then 2  times a day.  Taking a max right now of 450 MG daily 3-4 times a week since losing her brother to suicide and reports this dosing overall works better for her and she is sleeping better. Mood status: stable Satisfied with current treatment?: yes Symptom severity: mild  Duration of current treatment : chronic Side effects: no Medication compliance: good compliance Psychotherapy/counseling: none Depressed mood: no Anxious mood: no Anhedonia: no Significant weight loss or gain: no Insomnia: sleeping better Fatigue: no Feelings of worthlessness or guilt: no Impaired concentration/indecisiveness: no Suicidal ideations: no Hopelessness: no Crying spells: no    07/20/2022    9:58 AM 05/17/2022   11:00 AM 04/12/2022   10:26 AM 03/15/2022   11:02 AM 11/17/2021   11:41 AM  Depression screen PHQ 2/9  Decreased Interest 0 0 0 1 0  Down, Depressed, Hopeless 0 0 0 0 0  PHQ - 2 Score 0 0 0 1 0  Altered sleeping 0 '1 1 2 2  '$ Tired, decreased energy 0 1 0 1 0  Change in appetite 0 0 0 1 0  Feeling bad or failure about yourself  0 0 0 1 0  Trouble concentrating 0 0 0 1 0  Moving slowly or fidgety/restless 0 0 0 1 0  Suicidal thoughts 0 0  0 0  PHQ-9 Score 0 '2 1 8 2  '$ Difficult doing work/chores Not difficult at all Not difficult at all Not difficult at all Somewhat difficult Not difficult at all       07/20/2022    9:59 AM 05/17/2022   11:00 AM 04/12/2022   10:27 AM 03/15/2022   11:02 AM  GAD  7 : Generalized Anxiety Score  Nervous, Anxious, on Edge 0 0 0 0  Control/stop worrying 0 0 0 0  Worry too much - different things 0 0 0 0  Trouble relaxing 0 1 0 1  Restless 0 0 0 0  Easily annoyed or irritable 0 '1 1 1  '$ Afraid - awful might happen 0 0 0 0  Total GAD 7 Score 0 '2 1 2  '$ Anxiety Difficulty Not difficult at all Not difficult at all Not difficult at all Somewhat difficult        03/15/2022   11:02 AM 04/12/2022   10:26 AM 05/17/2022   11:00 AM 07/20/2022    9:56 AM 07/20/2022   10:24 AM   Fall Risk  Falls in the past year? 0 0 0 0 0  Was there an injury with Fall? 0 0 0 0 0  Fall Risk Category Calculator 0 0 0 0 0  Fall Risk Category Low Low Low Low Low  Patient Fall Risk Level Low fall risk  Low fall risk Low fall risk Low fall risk  Patient at Risk for Falls Due to No Fall Risks No Fall Risks No Fall Risks No Fall Risks No Fall Risks  Fall risk Follow up Falls evaluation completed Falls evaluation completed Falls evaluation completed Falls evaluation completed Falls prevention discussed    Functional Status Survey: Is the patient deaf or have difficulty hearing?: No Does the patient have difficulty seeing, even when wearing glasses/contacts?: No Does the patient have difficulty concentrating, remembering, or making decisions?: No Does the patient have difficulty walking or climbing stairs?: No Does the patient have difficulty dressing or bathing?: No Does the patient have difficulty doing errands alone such as visiting a doctor's office or shopping?: No    Past Medical History:  Past Medical History:  Diagnosis Date   Anxiety    Cancer ([place]) Several instances of the years   Skin   Chronic kidney disease    Cutaneous lupus erythematosus    Depression    Hyperlipidemia    Hypertension    Vertigo    rare    Surgical History:  Past Surgical History:  Procedure Laterality Date   BASAL CELL CARCINOMA EXCISION     BREAST BIOPSY Right 2000   Negative   CATARACT EXTRACTION Right 08/05/2019   COLONOSCOPY WITH PROPOFOL N/A 10/22/2015   Procedure: COLONOSCOPY WITH PROPOFOL;  Surgeon: Lucilla Lame, MD;  Location: [facility];  Service: Endoscopy;  Laterality: N/A;   ESOPHAGOGASTRODUODENOSCOPY (EGD) WITH PROPOFOL N/A 10/22/2015   Procedure: ESOPHAGOGASTRODUODENOSCOPY (EGD) WITH PROPOFOL;  Surgeon: Lucilla Lame, MD;  Location: [place] [facility];  Service: Endoscopy;  Laterality: N/A;   IMAGE GUIDED SINUS SURGERY  04/06/11   [facility], Dr. Pryor Ochoa   MASTOIDECTOMY       Medications:  Current Outpatient Medications on File Prior to Visit  Medication Sig   amLODipine-benazepril (LOTREL) 10-20 MG capsule Take 1 capsule by mouth daily.   Ascorbic Acid (VITAMIN C GUMMIES PO) Take 600 mg by mouth daily.   Cholecalciferol (VITAMIN D3 GUMMIES PO) Take 2,000 mg by mouth daily.   Cyanocobalamin (VITAMIN B12 PO) Take 1,000 mg by mouth daily.   estradiol (ESTRACE VAGINAL) 0.1 MG/GM vaginal cream Place 1 Applicatorful vaginally at bedtime. Place pea-sized amount vaginally at bedtime.   hydroxychloroquine (PLAQUENIL) 200 MG tablet Take 200 mg by mouth daily.   Magnesium 400 MG CAPS Take 1 capsule by mouth daily.   melatonin 3 MG TABS tablet Take  3 mg by mouth at bedtime as needed.   Multiple Vitamin (MULTI-VITAMIN DAILY PO) Take by mouth.   pantoprazole (PROTONIX) 40 MG tablet Take 1 tablet (40 mg total) by mouth daily.   Testosterone 1.62 % GEL Place 1 application  onto the skin 3 (three) times a week. I am unable to make the computer do a 4% gel but please use   tiZANidine (ZANAFLEX) 4 MG tablet Take 1 tablet (4 mg total) by mouth every 6 (six) hours as needed for muscle spasms.   No current facility-administered medications on file prior to visit.    Allergies:  Allergies  Allergen Reactions   Penicillins Rash    Social History:  Social History   Socioeconomic History   Marital status: Married    Spouse name: Not on file   Number of children: Not on file   Years of education: Not on file   Highest education level: Some college, no degree  Occupational History   Occupation: retired   Tobacco Use   Smoking status: Former    Types: Cigarettes    Quit date: 03/08/1965    Years since quitting: 57.4   Smokeless tobacco: Never  Vaping Use   Vaping Use: Never used  Substance and Sexual Activity   Alcohol use: Yes    Alcohol/week: 5.0 standard drinks of alcohol    Types: 2 Glasses of wine, 3 Shots of liquor per week   Drug use: No   Sexual activity:  Yes  Other Topics Concern   Not on file  Social History Narrative   Not on file   Social Determinants of Health   Financial Resource Strain: Low Risk  (08/11/2021)   Overall Financial Resource Strain (CARDIA)    Difficulty of Paying Living Expenses: Not hard at all  Food Insecurity: No Food Insecurity (08/11/2021)   Hunger Vital Sign    Worried About Running Out of Food in the Last Year: Never true    Ran Out of Food in the Last Year: Never true  Transportation Needs: No Transportation Needs (08/11/2021)   PRAPARE - Hydrologist (Medical): No    Lack of Transportation (Non-Medical): No  Physical Activity: Inactive (08/11/2021)   Exercise Vital Sign    Days of Exercise per Week: 0 days    Minutes of Exercise per Session: 0 min  Stress: No Stress Concern Present (08/11/2021)   [place]    Feeling of Stress : Not at all  Social Connections: [place] (08/11/2021)   Social Connection and Isolation Panel [NHANES]    Frequency of Communication with Friends and Family: More than three times a week    Frequency of Social Gatherings with Friends and Family: More than three times a week    Attends Religious Services: More than 4 times per year    Active Member of [employer] or Organizations: No    Attends Archivist Meetings: 1 to 4 times per year    Marital Status: Married  Human resources officer Violence: Not At Risk (08/11/2021)   Humiliation, Afraid, Rape, and Kick questionnaire    Fear of Current or Ex-Partner: No    Emotionally Abused: No    Physically Abused: No    Sexually Abused: No   Social History   Tobacco Use  Smoking Status Former   Types: Cigarettes   Quit date: 03/08/1965   Years since quitting: 57.4  Smokeless Tobacco Never   Social  History   Substance and Sexual Activity  Alcohol Use Yes   Alcohol/week: 5.0 standard drinks of alcohol   Types: 2 Glasses of  wine, 3 Shots of liquor per week    Family History:  Family History  Problem Relation Age of Onset   Heart disease Mother    Leukemia Father    Stroke Father    Cerebral aneurysm Sister    Prostate cancer Brother    Prostate cancer Brother    Prostate cancer Brother    Lung cancer Sister    Bladder Cancer Sister    Liver cancer Sister    Brain cancer Sister    Heart attack Sister    Breast cancer Neg Hx     Past medical history, surgical history, medications, allergies, family history and social history reviewed with patient today and changes made to appropriate areas of the chart.   ROS All other ROS negative except what is listed above and in the HPI.      Objective:    BP 136/79 (BP Location: Left Arm, Cuff Size: Normal)   Pulse (!) 58   Temp 98 F (36.7 C) (Oral)   Ht 5' 3.2" (1.605 m)   Wt 128 lb 11.2 oz (58.4 kg)   SpO2 96%   BMI 22.65 kg/m   Wt Readings from Last 3 Encounters:  07/20/22 128 lb 11.2 oz (58.4 kg)  05/17/22 131 lb 3.2 oz (59.5 kg)  04/12/22 131 lb 14.4 oz (59.8 kg)    Physical Exam Vitals and nursing note reviewed.  Constitutional:      General: She is awake. She is not in acute distress.    Appearance: She is well-developed. She is not ill-appearing.  HENT:     Head: Normocephalic and atraumatic.     Right Ear: Hearing, tympanic membrane, ear canal and external ear normal. No drainage.     Left Ear: Hearing, tympanic membrane, ear canal and external ear normal. No drainage.     Nose: Nose normal.     Right Sinus: No maxillary sinus tenderness or frontal sinus tenderness.     Left Sinus: No maxillary sinus tenderness or frontal sinus tenderness.     Mouth/Throat:     Mouth: Mucous membranes are moist.     Pharynx: Oropharynx is clear. Uvula midline. No pharyngeal swelling, oropharyngeal exudate or posterior oropharyngeal erythema.  Eyes:     General: Lids are normal.        Right eye: No discharge.        Left eye: No discharge.      Extraocular Movements: Extraocular movements intact.     Conjunctiva/sclera: Conjunctivae normal.     Pupils: Pupils are equal, round, and reactive to light.     Visual Fields: Right eye visual fields normal and left eye visual fields normal.  Neck:     Thyroid: No thyromegaly.     Vascular: No carotid bruit.     Trachea: Trachea normal.  Cardiovascular:     Rate and Rhythm: Normal rate and regular rhythm.     Heart sounds: Normal heart sounds. No murmur heard.    No gallop.  Pulmonary:     Effort: Pulmonary effort is normal. No accessory muscle usage or respiratory distress.     Breath sounds: Normal breath sounds.  Abdominal:     General: Bowel sounds are normal.     Palpations: Abdomen is soft. There is no hepatomegaly or splenomegaly.     Tenderness: There is no  abdominal tenderness.  Musculoskeletal:        General: Normal range of motion.     Cervical back: Normal range of motion and neck supple.     Right lower leg: No edema.     Left lower leg: No edema.  Lymphadenopathy:     Head:     Right side of head: No submental, submandibular, tonsillar, preauricular or posterior auricular adenopathy.     Left side of head: No submental, submandibular, tonsillar, preauricular or posterior auricular adenopathy.     Cervical: No cervical adenopathy.  Skin:    General: Skin is warm and dry.     Capillary Refill: Capillary refill takes less than 2 seconds.     Findings: No rash.  Neurological:     Mental Status: She is alert and oriented to person, place, and time.     Gait: Gait is intact.     Deep Tendon Reflexes: Reflexes are normal and symmetric.     Reflex Scores:      Brachioradialis reflexes are 2+ on the right side and 2+ on the left side.      Patellar reflexes are 2+ on the right side and 2+ on the left side. Psychiatric:        Attention and Perception: Attention normal.        Mood and Affect: Mood normal.        Speech: Speech normal.        Behavior: Behavior  normal. Behavior is cooperative.        Thought Content: Thought content normal.        Judgment: Judgment normal.    Results for orders placed or performed in visit on 07/20/22  Microalbumin, Urine Waived  Result Value Ref Range   Microalb, Ur Waived 30 (H) 0 - 19 mg/L   Creatinine, Urine Waived 300 10 - 300 mg/dL   Microalb/Creat Ratio <30 <30 mg/g      Assessment & Plan:   Problem List Items Addressed This Visit       Cardiovascular and Mediastinum   Hypertension - Primary    Chronic, stable with BP at goal for her age.  Recommend she monitor BP at least a few mornings a week at home and document.  DASH diet at home.  Continue current medication regimen and adjust as needed.  Labs today: CBC, CMP, TSH.  Urine ALB 04 August 2022, continue Benazepril for kidney protection.  Return in 6 months.       Relevant Medications   hydrochlorothiazide (HYDRODIURIL) 12.5 MG tablet   atorvastatin (LIPITOR) 20 MG tablet   Other Relevant Orders   TSH   Microalbumin, Urine Waived (Completed)     Digestive   Reflux esophagitis    Chronic, ongoing.  Recommend she slowly cut back on PPI use, which she wishes to trial.  Decrease to every other day for 1-2 weeks, and the every third day, etc in continued pattern until at lowest dose tolerated or discontinued.  Risks of PPI use were discussed with patient including bone loss, C. Diff diarrhea, pneumonia, infections, CKD, electrolyte abnormalities. Verbalizes understanding and chooses to continue the medication.       Relevant Orders   Magnesium     Musculoskeletal and Integument   Osteopenia    Chronic, ongoing.  Last DEXA 02/28/2019, continue supplements at this time.  Plan on repeat DEXA in June 2025.  Recommend reduction of PPI use.      Relevant Orders   VITAMIN  D 25 Hydroxy (Vit-D Deficiency, Fractures)     Genitourinary   Atrophic vaginitis    Chronic, ongoing.  Continues on Testosterone, started by her previous PCP.  Refills up  to date.  Check labs today and consider discontinuation in future, currently remains sexually active and this offers comfort.      Relevant Orders   Testosterone, free, total([facility])   CKD (chronic kidney disease) stage 3, GFR 30-59 ml/min (HCC)    Chronic, ongoing.  Continue collaboration with nephrology and Benazepril for kidney protection.  CMP and urine ALB today.      Relevant Orders   CBC with Differential/Platelet   Comprehensive metabolic panel     Other   Depression    Chronic, ongoing.  Denies SI/HI.   changed to SR dosing Wellbutrin past visits and been able to sleep better, although is taking 150 MG TID.  On review this is max dosing.  Discussed with her at length.  She notices further benefit from this.  Continue current dosing and adjust as needed.  Refills sent.      Relevant Medications   buPROPion (WELLBUTRIN SR) 150 MG 12 hr tablet   Hyperlipidemia    Chronic, ongoing.  Continue current medication regimen and adjust as needed.  Recheck lipid panel today.      Relevant Medications   hydrochlorothiazide (HYDRODIURIL) 12.5 MG tablet   atorvastatin (LIPITOR) 20 MG tablet   Other Relevant Orders   Lipid Panel w/o Chol/HDL Ratio   Insomnia    Chronic, stable.  Overall improved sleep with change to SR Wellbutrin vs XL and taking this TID.  Continue this and Melatonin as needed.      Systemic lupus erythematosus (HCC)    Chronic, stable.  Continue current medication regimen and collaboration with dermatology at [facility].        Other Visit Diagnoses     Encounter for annual physical exam       Annual physical today with labs and health maintenance reviewed, discussed with patient.        Follow up plan: Return in about 6 months (around 01/18/2023).   LABORATORY TESTING:  - Pap smear: not applicable  IMMUNIZATIONS:   - Tdap: Tetanus vaccination status reviewed: last tetanus booster within 10 years. - Influenza: Up to date - Pneumovax: Up to date -  Prevnar: Up to date - COVID: Up to date - HPV: Not applicable - Shingrix vaccine: Up to date  SCREENING: -Mammogram: Up to date  - Colonoscopy: Up to date  - Bone Density: Up to date  -Hearing Test: Not applicable  -Spirometry: Not applicable   PATIENT COUNSELING:   Advised to take 1 mg of folate supplement per day if capable of pregnancy.   Sexuality: Discussed sexually transmitted diseases, partner selection, use of condoms, avoidance of unintended pregnancy  and contraceptive alternatives.   Advised to avoid cigarette smoking.  I discussed with the patient that most people either abstain from alcohol or drink within safe limits (<=14/week and <=4 drinks/occasion for males, <=7/weeks and <= 3 drinks/occasion for females) and that the risk for alcohol disorders and other health effects rises proportionally with the number of drinks per week and how often a drinker exceeds daily limits.  Discussed cessation/primary prevention of drug use and availability of treatment for abuse.   Diet: Encouraged to adjust caloric intake to maintain  or achieve ideal body weight, to reduce intake of dietary saturated fat and total fat, to limit sodium intake by avoiding high  sodium foods and not adding table salt, and to maintain adequate dietary potassium and calcium preferably from fresh fruits, vegetables, and low-fat dairy products.    Stressed the importance of regular exercise  Injury prevention: Discussed safety belts, safety helmets, smoke detector, smoking near bedding or upholstery.   Dental health: Discussed importance of regular tooth brushing, flossing, and dental visits.    NEXT PREVENTATIVE PHYSICAL DUE IN 1 YEAR. Return in about 6 months (around 01/18/2023).

## 2022-07-20 NOTE — Patient Instructions (Signed)

## 2022-07-20 NOTE — Assessment & Plan Note (Signed)
Chronic, stable with BP at goal for her age.  Recommend she monitor BP at least a few mornings a week at home and document.  DASH diet at home.  Continue current medication regimen and adjust as needed.  Labs today: CBC, CMP, TSH.  Urine ALB 04 August 2022, continue Benazepril for kidney protection.  Return in 6 months.

## 2022-07-20 NOTE — Assessment & Plan Note (Signed)
Chronic, ongoing.  Continues on Testosterone, started by her previous PCP.  Refills up to date.  Check labs today and consider discontinuation in future, currently remains sexually active and this offers comfort.

## 2022-07-20 NOTE — Assessment & Plan Note (Signed)
Chronic, ongoing.  Continue collaboration with nephrology and Benazepril for kidney protection.  CMP and urine ALB today.

## 2022-07-21 ENCOUNTER — Encounter: Payer: Self-pay | Admitting: Podiatry

## 2022-07-21 ENCOUNTER — Telehealth: Payer: Self-pay

## 2022-07-21 NOTE — Progress Notes (Signed)
Good morning, please let Lindsey Blair know her labs have returned: - Kidney disease remains present, but no worsening -- remains in stable range for her. We will continue to monitor along with kidney doctor.  Sodium and calcium levels still pending, will let you know if abnormal. - Magnesium level a little high, if taking magnesium supplement switch this to every other day versus every day. - Remainder of labs stable -- no medication changes needed.  Any questions? Keep being amazing!!  Thank you for allowing me to participate in your care.  I appreciate you. Kindest regards, Jozelyn Kuwahara

## 2022-07-21 NOTE — Telephone Encounter (Signed)
Pt returned our all for lab results - Shared provider's note with pt.  Venita Lick, NP 07/21/2022 11:32 AM EST     Good morning, please let Anvi know her labs have returned: - Kidney disease remains present, but no worsening -- remains in stable range for her. We will continue to monitor along with kidney doctor.  Sodium and calcium levels still pending, will let you know if abnormal. - Magnesium level a little high, if taking magnesium supplement switch this to every other day versus every day. - Remainder of labs stable -- no medication changes needed.  Any questions? Keep being amazing!!  Thank you for allowing me to participate in your care.  I appreciate you. Kindest regards, Jolene

## 2022-07-24 LAB — CBC WITH DIFFERENTIAL/PLATELET
Basophils Absolute: 0 10*3/uL (ref 0.0–0.2)
Basos: 1 %
EOS (ABSOLUTE): 0.1 10*3/uL (ref 0.0–0.4)
Eos: 1 %
Hematocrit: 44.9 % (ref 34.0–46.6)
Hemoglobin: 15 g/dL (ref 11.1–15.9)
Immature Grans (Abs): 0 10*3/uL (ref 0.0–0.1)
Immature Granulocytes: 0 %
Lymphocytes Absolute: 1.6 10*3/uL (ref 0.7–3.1)
Lymphs: 28 %
MCH: 29 pg (ref 26.6–33.0)
MCHC: 33.4 g/dL (ref 31.5–35.7)
MCV: 87 fL (ref 79–97)
Monocytes Absolute: 0.5 10*3/uL (ref 0.1–0.9)
Monocytes: 9 %
Neutrophils Absolute: 3.5 10*3/uL (ref 1.4–7.0)
Neutrophils: 61 %
Platelets: 205 10*3/uL (ref 150–450)
RBC: 5.17 x10E6/uL (ref 3.77–5.28)
RDW: 12.1 % (ref 11.7–15.4)
WBC: 5.7 10*3/uL (ref 3.4–10.8)

## 2022-07-24 LAB — COMPREHENSIVE METABOLIC PANEL
ALT: 22 IU/L (ref 0–32)
AST: 27 IU/L (ref 0–40)
Albumin/Globulin Ratio: 1.9 (ref 1.2–2.2)
Albumin: 4.8 g/dL (ref 3.8–4.8)
Alkaline Phosphatase: 77 IU/L (ref 44–121)
BUN/Creatinine Ratio: 11 — ABNORMAL LOW (ref 12–28)
BUN: 14 mg/dL (ref 8–27)
Bilirubin Total: 0.7 mg/dL (ref 0.0–1.2)
CO2: 24 mmol/L (ref 20–29)
Calcium: 9.9 mg/dL (ref 8.7–10.3)
Chloride: 98 mmol/L (ref 96–106)
Creatinine, Ser: 1.28 mg/dL — ABNORMAL HIGH (ref 0.57–1.00)
Globulin, Total: 2.5 g/dL (ref 1.5–4.5)
Glucose: 90 mg/dL (ref 70–99)
Potassium: 4.8 mmol/L (ref 3.5–5.2)
Sodium: 138 mmol/L (ref 134–144)
Total Protein: 7.3 g/dL (ref 6.0–8.5)
eGFR: 43 mL/min/{1.73_m2} — ABNORMAL LOW (ref >59)

## 2022-07-24 LAB — LIPID PANEL W/O CHOL/HDL RATIO
Cholesterol, Total: 176 mg/dL (ref 100–199)
HDL: 72 mg/dL (ref >39)
LDL Chol Calc (NIH): 84 mg/dL (ref 0–99)
Triglycerides: 116 mg/dL (ref 0–149)
VLDL Cholesterol Cal: 20 mg/dL (ref 5–40)

## 2022-07-24 LAB — TESTOSTERONE, FREE, TOTAL, SHBG
Sex Hormone Binding: 90 nmol/L (ref 17.3–125.0)
Testosterone, Free: 0.4 pg/mL (ref 0.0–4.2)
Testosterone: 8 ng/dL (ref 3–67)

## 2022-07-24 LAB — VITAMIN D 25 HYDROXY (VIT D DEFICIENCY, FRACTURES): Vit D, 25-Hydroxy: 38.2 ng/mL (ref 30.0–100.0)

## 2022-07-24 LAB — MAGNESIUM: Magnesium: 2.6 mg/dL — ABNORMAL HIGH (ref 1.6–2.3)

## 2022-07-24 LAB — TSH: TSH: 1.25 u[IU]/mL (ref 0.450–4.500)

## 2022-08-10 ENCOUNTER — Encounter: Payer: Self-pay | Admitting: Podiatry

## 2022-08-10 ENCOUNTER — Ambulatory Visit: Payer: PPO | Admitting: Podiatry

## 2022-08-10 DIAGNOSIS — L603 Nail dystrophy: Secondary | ICD-10-CM | POA: Diagnosis not present

## 2022-08-10 MED ORDER — ITRACONAZOLE 100 MG PO CAPS: ORAL_CAPSULE | ORAL | 0 refills | Status: DC

## 2022-08-10 NOTE — Progress Notes (Signed)
She presents today for follow-up of her nail pathology.  Objective: Pathology does demonstrate mold and yeast.  Assessment: Onychomycosis.  Plan: We discussed oral therapy and laser therapy and no therapy.  At this point she would like to try pulsed dose of itraconazole.  We will start her on that on for a week off for 3 weeks and then follow-up with me in 3 months

## 2022-09-08 ENCOUNTER — Telehealth: Payer: Self-pay | Admitting: *Deleted

## 2022-09-08 NOTE — Telephone Encounter (Signed)
Called patient to update that insurance denied the medication (intraconazole) , that she can use the good RX at [facility], said that she may not use right now, will call back if she changes her mind.

## 2022-09-27 ENCOUNTER — Other Ambulatory Visit: Payer: Self-pay | Admitting: Nurse Practitioner

## 2022-09-27 NOTE — Telephone Encounter (Signed)
Unable to refill per protocol, Rx expired. Discontinued 11/17/21, will refuse.  Requested Prescriptions  Pending Prescriptions Disp Refills   eszopiclone (LUNESTA) 2 MG TABS tablet [Pharmacy Med Name: ESZOPICLONE 2 MG TABLET] 30 tablet 0    Sig: Take 1 tablet (2 mg total) by mouth at bedtime asneeded for sleep. Take immediately before bedtime     Not Delegated - Psychiatry:  Anxiolytics/Hypnotics Failed - 09/27/2022  8:44 AM      Failed - This refill cannot be delegated      Failed - Urine Drug Screen completed in last 360 days      Passed - Valid encounter within last 6 months    Recent Outpatient Visits           2 months ago Primary hypertension   [place], [place] T, NP   4 months ago Recurrent major depressive disorder, in full remission ([place])   [place] Kathrine Haddock, NP   5 months ago Psychophysiological insomnia   [place] Kathrine Haddock, NP   6 months ago Psychophysiological insomnia   [place] Kathrine Haddock, NP   10 months ago Primary hypertension   [place] Vigg, Avanti, MD       Future Appointments             In 3 months Cannady, Barbaraann Faster, NP [place], [facility]

## 2022-09-29 ENCOUNTER — Ambulatory Visit: Payer: Self-pay | Admitting: *Deleted

## 2022-09-29 NOTE — Telephone Encounter (Signed)
Summary: concerns about positive Covid result   Pt reports that she tested positive for Covid and would like to speak with someone to explain what she should be doing and if a Rx is needed. Cb# [phone]          Chief Complaint: Covid Positive Symptoms: Mild sore throat, runny nose, cough, productive clear. Nasal drainage clear. LGT last night, none today. Frequency: Monday night Pertinent Negatives: Patient denies SOB, fever Disposition: '[]'$ ED /'[]'$ Urgent Care (no appt availability in office) / '[]'$ Appointment(In office/virtual)/ '[]'$  [facility]/ '[]'$ Home Care/ '[]'$ Refused Recommended Disposition /'[]'$ [facility]/ '[x]'$  Follow-up with PCP Additional Notes: Pt would like Jolene's recommendation on oral anti viral. States "Didn't help me much before, I'm not sure if I should take it." Assured pt NT would route to practice for PCPs review and recommendation. Care advise provided, self isolation guidelines reviewed, pt verbalizes understanding.  Please advise. Reason for Disposition  [1] HIGH RISK patient (e.g., weak immune system, age > 47 years, obesity with BMI 30 or higher, pregnant, chronic lung disease or other chronic medical condition) AND [2] COVID symptoms (e.g., cough, fever)  (Exceptions: Already seen by PCP and no new or worsening symptoms.)  Answer Assessment - Initial Assessment Questions 1. COVID-19 DIAGNOSIS: "How do you know that you have COVID?" (e.g., positive lab test or self-test, diagnosed by doctor or NP/PA, symptoms after exposure).     Home test 2. COVID-19 EXPOSURE: "Was there any known exposure to COVID before the symptoms began?" CDC Definition of close contact: within 6 feet (2 meters) for a total of 15 minutes or more over a 24-hour period.      Friends positive as well 3. ONSET: "When did the COVID-19 symptoms start?"      Monday night 5. COUGH: "Do you have a cough?" If Yes, ask: "How bad is the cough?"       Dry cough 6. FEVER: "Do you have a  fever?" If Yes, ask: "What is your temperature, how was it measured, and when did it start?"     99.9 last night , not today 7. RESPIRATORY STATUS: "Describe your breathing?" (e.g., normal; shortness of breath, wheezing, unable to speak)      no 8. BETTER-SAME-WORSE: "Are you getting better, staying the same or getting worse compared to yesterday?"  If getting worse, ask, "In what way?"     Better 9. OTHER SYMPTOMS: "Do you have any other symptoms?"  (e.g., chills, fatigue, headache, loss of smell or taste, muscle pain, sore throat)     Mild sore throat, nasal drainage,clear 10. HIGH RISK DISEASE: "Do you have any chronic medical problems?" (e.g., asthma, heart or lung disease, weak immune system, obesity, etc.)       Yes 11. VACCINE: "Have you had the COVID-19 vaccine?" If Yes, ask: "Which one, how many shots, when did you get it?"       Yes all except most recent.  Protocols used: Coronavirus (LMBEM-75) Diagnosed or Suspected-A-AH

## 2022-10-17 ENCOUNTER — Ambulatory Visit (INDEPENDENT_AMBULATORY_CARE_PROVIDER_SITE_OTHER): Payer: PPO

## 2022-10-17 VITALS — Ht 63.0 in | Wt 128.0 lb

## 2022-10-17 DIAGNOSIS — Z0000 Encounter for general adult medical examination without abnormal findings: Secondary | ICD-10-CM | POA: Diagnosis not present

## 2022-10-17 NOTE — Progress Notes (Signed)
Virtual Visit via Telephone Note  I connected with  Lindsey Blair on 10/17/22 at  8:15 AM EST by telephone and verified that I am speaking with the correct person using two identifiers.  Location: Patient: home Provider: [facility] Persons participating in the virtual visit: patient/Nurse Health Advisor   I discussed the limitations, risks, security and privacy concerns of performing an evaluation and management service by telephone and the availability of in person appointments. The patient expressed understanding and agreed to proceed.  Interactive audio and video telecommunications were attempted between this nurse and patient, however failed, due to patient having technical difficulties OR patient did not have access to video capability.  We continued and completed visit with audio only.  Some vital signs may be absent or patient reported.   Dionisio David, LPN  Subjective:   Lindsey Blair is a 77 y.o. female who presents for Medicare Annual (Subsequent) preventive examination.  Review of Systems     Cardiac Risk Factors include: advanced age (>53mn, >>6women);hypertension     Objective:    There were no vitals filed for this visit. There is no height or weight on file to calculate BMI.     10/17/2022    8:21 AM 08/11/2021    9:29 AM 05/20/2021    2:37 PM 08/10/2020    9:03 AM 11/01/2018    9:49 AM 02/17/2017    8:19 AM 10/22/2015    8:35 AM  Advanced Directives  Does Patient Have a Medical Advance Directive? No Yes Yes Yes Yes No No  Type of ACorporate treasurerof [place] will [place] will [place] will    Copy of HHughesin Chart?  Yes - validated most recent copy scanned in chart (See row information)  No - copy requested No - copy requested    Would patient like information on creating a medical advance directive? No - Patient declined  No -  Patient declined   Yes (MAU/Ambulatory/Procedural Areas - Information given) No - patient declined information    Current Medications (verified) Outpatient Encounter Medications as of 10/17/2022  Medication Sig   amLODipine-benazepril (LOTREL) 10-20 MG capsule Take 1 capsule by mouth daily.   Ascorbic Acid (VITAMIN C GUMMIES PO) Take 600 mg by mouth daily.   atorvastatin (LIPITOR) 20 MG tablet Take 1 tablet (20 mg total) by mouth daily.   buPROPion (WELLBUTRIN SR) 150 MG 12 hr tablet Take 1 tablet (150 mg total) by mouth 3 (three) times daily.   Cholecalciferol (VITAMIN D3 GUMMIES PO) Take 2,000 mg by mouth daily.   Cyanocobalamin (VITAMIN B12 PO) Take 1,000 mg by mouth daily.   estradiol (ESTRACE VAGINAL) 0.1 MG/GM vaginal cream Place 1 Applicatorful vaginally at bedtime. Place pea-sized amount vaginally at bedtime.   hydrochlorothiazide (HYDRODIURIL) 12.5 MG tablet Take 1 tablet (12.5 mg total) by mouth daily.   hydroxychloroquine (PLAQUENIL) 200 MG tablet Take 200 mg by mouth daily.   itraconazole (SPORANOX) 100 MG capsule Take one capsule BID x 7 days, then stop x 21 days. Repeat round (3 month supply)   melatonin 3 MG TABS tablet Take 3 mg by mouth at bedtime as needed.   Multiple Vitamin (MULTI-VITAMIN DAILY PO) Take by mouth.   pantoprazole (PROTONIX) 40 MG tablet Take 1 tablet (40 mg total) by mouth daily.   Testosterone 1.62 % GEL Place 1 application  onto the skin 3 (three) times a  week. I am unable to make the computer do a 4% gel but please use   tiZANidine (ZANAFLEX) 4 MG tablet Take 1 tablet (4 mg total) by mouth every 6 (six) hours as needed for muscle spasms.   Magnesium 400 MG CAPS Take 1 capsule by mouth daily. (Patient not taking: Reported on 10/17/2022)   No facility-administered encounter medications on file as of 10/17/2022.    Allergies (verified) Penicillins   History: Past Medical History:  Diagnosis Date   Anxiety    Cancer ([place]) Several instances of the years    Skin   Chronic kidney disease    Cutaneous lupus erythematosus    Depression    Hyperlipidemia    Hypertension    Vertigo    rare   Past Surgical History:  Procedure Laterality Date   BASAL CELL CARCINOMA EXCISION     BREAST BIOPSY Right 2000   Negative   CATARACT EXTRACTION Right 08/05/2019   COLONOSCOPY WITH PROPOFOL N/A 10/22/2015   Procedure: COLONOSCOPY WITH PROPOFOL;  Surgeon: Lucilla Lame, MD;  Location: [facility];  Service: Endoscopy;  Laterality: N/A;   ESOPHAGOGASTRODUODENOSCOPY (EGD) WITH PROPOFOL N/A 10/22/2015   Procedure: ESOPHAGOGASTRODUODENOSCOPY (EGD) WITH PROPOFOL;  Surgeon: Lucilla Lame, MD;  Location: [facility];  Service: Endoscopy;  Laterality: N/A;   IMAGE GUIDED SINUS SURGERY  04/06/11   [facility], Dr. Pryor Ochoa   MASTOIDECTOMY     Family History  Problem Relation Age of Onset   Heart disease Mother    Leukemia Father    Stroke Father    Cerebral aneurysm Sister    Prostate cancer Brother    Prostate cancer Brother    Prostate cancer Brother    Lung cancer Sister    Bladder Cancer Sister    Liver cancer Sister    Brain cancer Sister    Heart attack Sister    Breast cancer Neg Hx    Social History   Socioeconomic History   Marital status: Married    Spouse name: Not on file   Number of children: Not on file   Years of education: Not on file   Highest education level: Some college, no degree  Occupational History   Occupation: retired   Tobacco Use   Smoking status: Former    Types: Cigarettes    Quit date: 03/08/1965    Years since quitting: 84.6   Smokeless tobacco: Never  Vaping Use   Vaping Use: Never used  Substance and Sexual Activity   Alcohol use: Yes    Alcohol/week: 5.0 standard drinks of alcohol    Types: 2 Glasses of wine, 3 Shots of liquor per week    Comment: daily   Drug use: No   Sexual activity: Yes  Other Topics Concern   Not on file  Social History Narrative   Not on file   Social Determinants of  Health   Financial Resource Strain: Low Risk  (10/17/2022)   Overall Financial Resource Strain (CARDIA)    Difficulty of Paying Living Expenses: Not hard at all  Food Insecurity: No Food Insecurity (10/17/2022)   Hunger Vital Sign    Worried About Running Out of Food in the Last Year: Never true    Ran Out of Food in the Last Year: Never true  Transportation Needs: No Transportation Needs (10/17/2022)   PRAPARE - Hydrologist (Medical): No    Lack of Transportation (Non-Medical): No  Physical Activity: Insufficiently Active (10/17/2022)   Exercise  Vital Sign    Days of Exercise per Week: 3 days    Minutes of Exercise per Session: 30 min  Stress: No Stress Concern Present (10/17/2022)   [place]    Feeling of Stress : Not at all  Social Connections: Unknown (10/17/2022)   Social Connection and Isolation Panel [NHANES]    Frequency of Communication with Friends and Family: More than three times a week    Frequency of Social Gatherings with Friends and Family: More than three times a week    Attends Religious Services: More than 4 times per year    Active Member of [employer] or Organizations: No    Attends Music therapist: Never    Marital Status: Not on file    Tobacco Counseling Counseling given: Not Answered   Clinical Intake:  Pre-visit preparation completed: Yes  Pain : No/denies pain     Nutritional Risks: None Diabetes: No  How often do you need to have someone help you when you read instructions, pamphlets, or other written materials from your doctor or pharmacy?: 1 - Never  Diabetic?no  Interpreter Needed?: No  Information entered by :: Kirke Shaggy, LPN   Activities of Daily Living    10/17/2022    8:22 AM 07/20/2022   10:25 AM  In your present state of health, do you have any difficulty performing the following activities:  Hearing? 0 0  Vision? 0 0   Difficulty concentrating or making decisions? 0 0  Walking or climbing stairs? 0 0  Dressing or bathing? 0 0  Doing errands, shopping? 0 0  Preparing Food and eating ? N   Using the Toilet? N   In the past six months, have you accidently leaked urine? N   Do you have problems with loss of bowel control? N   Managing your Medications? N   Managing your Finances? N   Housekeeping or managing your Housekeeping? N     Patient Care Team: Venita Lick, NP as PCP - General (Nurse Practitioner) Lucilla Lame, MD as Consulting Physician (Gastroenterology) Lavonia Dana, MD as Consulting Physician (Internal Medicine) Emmaline Kluver., MD (Rheumatology)  Indicate any recent Medical Services you may have received from other than [facility] providers in the past year (date may be approximate).     Assessment:   This is a routine wellness examination for [place].  Hearing/Vision screen Hearing Screening - Comments:: No aids Vision Screening - Comments:: Readers- Dr.Johnson [facility]  Dietary issues and exercise activities discussed: Current Exercise Habits: Home exercise routine, Type of exercise: walking, Time (Minutes): 30, Frequency (Times/Week): 3, Weekly Exercise (Minutes/Week): 90, Intensity: Mild   Goals Addressed             This Visit's Progress    DIET - EAT MORE FRUITS AND VEGETABLES         Depression Screen    10/17/2022    8:20 AM 07/20/2022    9:58 AM 05/17/2022   11:00 AM 04/12/2022   10:26 AM 03/15/2022   11:02 AM 11/17/2021   11:41 AM 09/14/2021    2:04 PM  PHQ 2/9 Scores  PHQ - 2 Score 0 0 0 0 1 0 0  PHQ- 9 Score 0 0 2 1 8 2 4    $ Fall Risk    10/17/2022    8:22 AM 07/20/2022   10:24 AM 07/20/2022    9:56 AM 05/17/2022   11:00 AM 04/12/2022  10:26 AM  Fall Risk   Falls in the past year? 0 0 0 0 0  Number falls in past yr: 0 0 0 0 0  Injury with Fall? 0 0 0 0 0  Risk for fall due to : No Fall Risks No Fall Risks No Fall Risks No Fall Risks No  Fall Risks  Follow up Falls prevention discussed;Falls evaluation completed Falls prevention discussed Falls evaluation completed Falls evaluation completed Falls evaluation completed    FALL RISK PREVENTION PERTAINING TO THE HOME:  Any stairs in or around the home? Yes  If so, are there any without handrails? No  Home free of loose throw rugs in walkways, pet beds, electrical cords, etc? Yes  Adequate lighting in your home to reduce risk of falls? Yes   ASSISTIVE DEVICES UTILIZED TO PREVENT FALLS:  Life alert? No  Use of a cane, walker or w/c? No  Grab bars in the bathroom? No  Shower chair or bench in shower? No  Elevated toilet seat or a handicapped toilet? No   Cognitive Function:        10/17/2022    8:23 AM 08/10/2020    9:07 AM 02/17/2017    8:23 AM  6CIT Screen  What Year? 0 points 0 points 0 points  What month? 0 points 0 points 0 points  What time? 0 points 0 points 0 points  Count back from 20 0 points 2 points 0 points  Months in reverse 0 points 0 points 0 points  Repeat phrase 0 points 2 points 0 points  Total Score 0 points 4 points 0 points    Immunizations Immunization History  Administered Date(s) Administered   Fluad Quad(high Dose 65+) 09/14/2021   Influenza, High Dose Seasonal PF 05/14/2018, 06/22/2022   Influenza,inj,quad, With Preservative 06/17/2019   Influenza-Unspecified 06/11/2015, 06/24/2016, 06/08/2017, 06/11/2020, 06/20/2022   Moderna Sars-Covid-2 Vaccination 10/22/2019, 11/19/2019, 06/11/2020   Pneumococcal Conjugate-13 04/09/2014   Pneumococcal-Unspecified 10/03/2011   Td 08/25/2005, 10/08/2015   Zoster Recombinat (Shingrix) 11/08/2018, 02/25/2019   Zoster, Live 08/27/2008    TDAP status: Up to date  Flu Vaccine status: Up to date  Pneumococcal vaccine status: Up to date  Covid-19 vaccine status: Completed vaccines  Qualifies for Shingles Vaccine? Yes   Zostavax completed Yes   Shingrix Completed?: Yes  Screening  Tests Health Maintenance  Topic Date Due   COVID-19 Vaccine (4 - 2023-24 season) 05/06/2022   Medicare Annual Wellness (AWV)  10/18/2023   DTaP/Tdap/Td (3 - Tdap) 10/07/2025   INFLUENZA VACCINE  Completed   DEXA SCAN  Completed   Hepatitis C Screening  Completed   Zoster Vaccines- Shingrix  Completed   HPV VACCINES  Aged Out   Pneumonia Vaccine 33+ Years old  Discontinued   COLONOSCOPY (Pts 45-63yr Insurance coverage will need to be confirmed)  Discontinued    Health Maintenance  Health Maintenance Due  Topic Date Due   COVID-19 Vaccine (4 - 2023-24 season) 05/06/2022    Colorectal cancer screening: No longer required.   Mammogram status: No longer required due to age.  Bone Density status: Completed 02/28/19. Results reflect: Bone density results: OSTEOPENIA. Repeat every 5 years.  Lung Cancer Screening: (Low Dose CT Chest recommended if Age 78-80years, 30 pack-year currently smoking OR have quit w/in 15years.) does not qualify.   Additional Screening:  Hepatitis C Screening: does qualify; Completed 01/06/16  Vision Screening: Recommended annual ophthalmology exams for early detection of glaucoma and other disorders of the eye. Is the  patient up to date with their annual eye exam?  Yes  Who is the provider or what is the name of the office in which the patient attends annual eye exams? [facility]  If pt is not established with a provider, would they like to be referred to a provider to establish care? No .   Dental Screening: Recommended annual dental exams for proper oral hygiene  Community Resource Referral / Chronic Care Management: CRR required this visit?  No   CCM required this visit?  No      Plan:     I have personally reviewed and noted the following in the patient's chart:   Medical and social history Use of alcohol, tobacco or illicit drugs  Current medications and supplements including opioid prescriptions. Patient is not currently taking opioid  prescriptions. Functional ability and status Nutritional status Physical activity Advanced directives List of other physicians Hospitalizations, surgeries, and ER visits in previous 12 months Vitals Screenings to include cognitive, depression, and falls Referrals and appointments  In addition, I have reviewed and discussed with patient certain preventive protocols, quality metrics, and best practice recommendations. A written personalized care plan for preventive services as well as general preventive health recommendations were provided to patient.     Dionisio David, LPN   [id number]   Nurse Notes: none

## 2022-10-17 NOTE — Patient Instructions (Signed)
Ms. Waddel , Thank you for taking time to come for your Medicare Wellness Visit. I appreciate your ongoing commitment to your health goals. Please review the following plan we discussed and let me know if I can assist you in the future.   These are the goals we discussed:  Goals      DIET - EAT MORE FRUITS AND VEGETABLES     Increase water intake     Recommend drinking at least 3-4 glasses of water a day      Patient Stated     08/10/2020, cut down on cholesterol medication     Patient Stated     Continue current lifestyle         This is a list of the screening recommended for you and due dates:  Health Maintenance  Topic Date Due   COVID-19 Vaccine (4 - 2023-24 season) 05/06/2022   Medicare Annual Wellness Visit  10/18/2023   DTaP/Tdap/Td vaccine (3 - Tdap) 10/07/2025   Flu Shot  Completed   DEXA scan (bone density measurement)  Completed   Hepatitis C Screening: USPSTF Recommendation to screen - Ages 23-79 yo.  Completed   Zoster (Shingles) Vaccine  Completed   HPV Vaccine  Aged Out   Pneumonia Vaccine  Discontinued   Colon Cancer Screening  Discontinued    Advanced directives: no  Conditions/risks identified: none  Next appointment: Follow up in one year for your annual wellness visit 10/23/23 @ 8:15 am by phone   Preventive Care 65 Years and Older, Female Preventive care refers to lifestyle choices and visits with your health care provider that can promote health and wellness. What does preventive care include? A yearly physical exam. This is also called an annual well check. Dental exams once or twice a year. Routine eye exams. Ask your health care provider how often you should have your eyes checked. Personal lifestyle choices, including: Daily care of your teeth and gums. Regular physical activity. Eating a healthy diet. Avoiding tobacco and drug use. Limiting alcohol use. Practicing safe sex. Taking low-dose aspirin every day. Taking vitamin and mineral  supplements as recommended by your health care provider. What happens during an annual well check? The services and screenings done by your health care provider during your annual well check will depend on your age, overall health, lifestyle risk factors, and family history of disease. Counseling  Your health care provider may ask you questions about your: Alcohol use. Tobacco use. Drug use. Emotional well-being. Home and relationship well-being. Sexual activity. Eating habits. History of falls. Memory and ability to understand (cognition). Work and work Statistician. Reproductive health. Screening  You may have the following tests or measurements: Height, weight, and BMI. Blood pressure. Lipid and cholesterol levels. These may be checked every 5 years, or more frequently if you are over 32 years old. Skin check. Lung cancer screening. You may have this screening every year starting at age 86 if you have a 30-pack-year history of smoking and currently smoke or have quit within the past 15 years. Fecal occult blood test (FOBT) of the stool. You may have this test every year starting at age 76. Flexible sigmoidoscopy or colonoscopy. You may have a sigmoidoscopy every 5 years or a colonoscopy every 10 years starting at age 72. Hepatitis C blood test. Hepatitis B blood test. Sexually transmitted disease (STD) testing. Diabetes screening. This is done by checking your blood sugar (glucose) after you have not eaten for a while (fasting). You may have this  done every 1-3 years. Bone density scan. This is done to screen for osteoporosis. You may have this done starting at age 26. Mammogram. This may be done every 1-2 years. Talk to your health care provider about how often you should have regular mammograms. Talk with your health care provider about your test results, treatment options, and if necessary, the need for more tests. Vaccines  Your health care provider may recommend certain  vaccines, such as: Influenza vaccine. This is recommended every year. Tetanus, diphtheria, and acellular pertussis (Tdap, Td) vaccine. You may need a Td booster every 10 years. Zoster vaccine. You may need this after age 64. Pneumococcal 13-valent conjugate (PCV13) vaccine. One dose is recommended after age 92. Pneumococcal polysaccharide (PPSV23) vaccine. One dose is recommended after age 38. Talk to your health care provider about which screenings and vaccines you need and how often you need them. This information is not intended to replace advice given to you by your health care provider. Make sure you discuss any questions you have with your health care provider. Document Released: 09/18/2015 Document Revised: 05/11/2016 Document Reviewed: 06/23/2015 Elsevier Interactive Patient Education  2017 [employer] Prevention in the Home Falls can cause injuries. They can happen to people of all ages. There are many things you can do to make your home safe and to help prevent falls. What can I do on the outside of my home? Regularly fix the edges of walkways and driveways and fix any cracks. Remove anything that might make you trip as you walk through a door, such as a raised step or threshold. Trim any bushes or trees on the path to your home. Use bright outdoor lighting. Clear any walking paths of anything that might make someone trip, such as rocks or tools. Regularly check to see if handrails are loose or broken. Make sure that both sides of any steps have handrails. Any raised decks and porches should have guardrails on the edges. Have any leaves, snow, or ice cleared regularly. Use sand or salt on walking paths during winter. Clean up any spills in your garage right away. This includes oil or grease spills. What can I do in the bathroom? Use night lights. Install grab bars by the toilet and in the tub and shower. Do not use towel bars as grab bars. Use non-skid mats or decals in  the tub or shower. If you need to sit down in the shower, use a plastic, non-slip stool. Keep the floor dry. Clean up any water that spills on the floor as soon as it happens. Remove soap buildup in the tub or shower regularly. Attach bath mats securely with double-sided non-slip rug tape. Do not have throw rugs and other things on the floor that can make you trip. What can I do in the bedroom? Use night lights. Make sure that you have a light by your bed that is easy to reach. Do not use any sheets or blankets that are too big for your bed. They should not hang down onto the floor. Have a firm chair that has side arms. You can use this for support while you get dressed. Do not have throw rugs and other things on the floor that can make you trip. What can I do in the kitchen? Clean up any spills right away. Avoid walking on wet floors. Keep items that you use a lot in easy-to-reach places. If you need to reach something above you, use a strong step stool that  has a grab bar. Keep electrical cords out of the way. Do not use floor polish or wax that makes floors slippery. If you must use wax, use non-skid floor wax. Do not have throw rugs and other things on the floor that can make you trip. What can I do with my stairs? Do not leave any items on the stairs. Make sure that there are handrails on both sides of the stairs and use them. Fix handrails that are broken or loose. Make sure that handrails are as long as the stairways. Check any carpeting to make sure that it is firmly attached to the stairs. Fix any carpet that is loose or worn. Avoid having throw rugs at the top or bottom of the stairs. If you do have throw rugs, attach them to the floor with carpet tape. Make sure that you have a light switch at the top of the stairs and the bottom of the stairs. If you do not have them, ask someone to add them for you. What else can I do to help prevent falls? Wear shoes that: Do not have high  heels. Have rubber bottoms. Are comfortable and fit you well. Are closed at the toe. Do not wear sandals. If you use a stepladder: Make sure that it is fully opened. Do not climb a closed stepladder. Make sure that both sides of the stepladder are locked into place. Ask someone to hold it for you, if possible. Clearly mark and make sure that you can see: Any grab bars or handrails. First and last steps. Where the edge of each step is. Use tools that help you move around (mobility aids) if they are needed. These include: Canes. Walkers. Scooters. Crutches. Turn on the lights when you go into a dark area. Replace any light bulbs as soon as they burn out. Set up your furniture so you have a clear path. Avoid moving your furniture around. If any of your floors are uneven, fix them. If there are any pets around you, be aware of where they are. Review your medicines with your doctor. Some medicines can make you feel dizzy. This can increase your chance of falling. Ask your doctor what other things that you can do to help prevent falls. This information is not intended to replace advice given to you by your health care provider. Make sure you discuss any questions you have with your health care provider. Document Released: 06/18/2009 Document Revised: 01/28/2016 Document Reviewed: 09/26/2014 Elsevier Interactive Patient Education  2017 [employer].

## 2022-11-16 ENCOUNTER — Encounter: Payer: Self-pay | Admitting: Podiatry

## 2022-11-16 ENCOUNTER — Ambulatory Visit: Payer: PPO | Admitting: Podiatry

## 2022-11-16 DIAGNOSIS — L603 Nail dystrophy: Secondary | ICD-10-CM

## 2022-11-16 MED ORDER — ITRACONAZOLE 100 MG PO CAPS: ORAL_CAPSULE | ORAL | 0 refills | Status: DC

## 2022-11-16 NOTE — Progress Notes (Signed)
She presents today for follow-up of her onychomycosis hallux right.  She denies fever chills nausea vomit muscle aches pains calf pain back pain chest pain shortness of breath.  She has finished her first 3 months of itraconazole pulsed dose without any complications.  Objective: Vital signs stable alert oriented x 3 hallux abductovalgus deformity is identifiable and tender on palpation.  She also has really no change with exception of may be some lightening to the nail plate itself.  Assessment: Onychomycosis long-term therapy with itraconazole pulsed dose.  Hallux abductovalgus deformity right foot.  Plan: Discussed etiology pathology and surgical therapies discussed her bunion deformity with her today.  Also discussed adding another 3 months of pulsed dose of Lamisil.  She denies any changes in her current medications.  I will follow-up with her in 4 months to see if we need to continue the medication if it is indeed working for her.  If it is not working and no changes have occurred then we will discontinue.

## 2023-01-15 NOTE — Patient Instructions (Signed)
Be Involved in Your Health Care:  Taking Medications When medications are taken as directed, they can greatly improve your health. But if they are not taken as instructed, they may not work. In some cases, not taking them correctly can be harmful. To help ensure your treatment remains effective and safe, understand your medications and how to take them.  Your lab results, notes and after visit summary will be available on My Chart. We strongly encourage you to use this feature. If lab results are abnormal the clinic will contact you with the appropriate steps. If the clinic does not contact you assume the results are satisfactory. You can always see your results on My Chart. If you have questions regarding your condition, please contact the clinic during office hours. You can also ask questions on My Chart.  We at [facility] are grateful that you chose us to provide care. We strive to provide excellent and compassionate care and are always looking for feedback. If you get a survey from the clinic please complete this.   Food Basics for Chronic Kidney Disease Chronic kidney disease (CKD) is when your kidneys are not working well. They cannot remove waste, fluids, and other substances from your blood the way they should. These substances can build up, which can worsen kidney damage and affect how your body works. Eating certain foods can lead to a buildup of these substances. Changing your diet can help prevent more kidney damage. Diet changes may also delay dialysis or even keep you from needing it. What nutrients should I limit? Work with your treatment team and a food expert (dietitian) to make a meal plan that's right for you. Foods you can eat and foods you should limit or avoid will depend on the stage of your kidney disease and any other health conditions you have. The items listed below are not a complete list. Talk with your dietitian to learn what is best for  you. Potassium Potassium affects how steadily your heart beats. Too much potassium in your blood can cause an irregular heartbeat or even a heart attack. You may need to limit foods that are high in potassium, such as: Liquid milk and soy milk. Salt substitutes that contain potassium. Fruits like bananas, apricots, nectarines, melon, prunes, raisins, kiwi, and oranges. Vegetables, such as potatoes, sweet potatoes, yams, tomatoes, leafy greens, beets, avocado, pumpkin, and winter squash. Beans, like lima beans. Nuts. Phosphorus Phosphorus is a mineral found in your bones. You need a balance between calcium and phosphorus to build and maintain healthy bones. Too much added phosphorus from the foods you eat can pull calcium from your bones. Losing calcium can make your bones weak and more likely to break. Too much phosphorus can also make your skin itch. You may need to limit foods that are high in phosphorus or that have added phosphorus, such as: Liquid milk and dairy products. Dark-colored sodas or soft drinks. Bran cereals and oatmeal. Protein  Protein helps you make and keep muscle. Protein also helps to repair your body's cells and tissues. One of the natural breakdown products of protein is a waste product called urea. When your kidneys are not working well, they cannot remove types of waste like urea. Reducing protein in your diet can help keep urea from building up in your blood. Depending on your stage of kidney disease, you may need to eat smaller portions of foods that are high in protein. Sources of animal protein include: Meat (all types). Fish and   seafood. Poultry. Eggs. Dairy. Other protein foods include: Beans and legumes. Nuts and nut butter. Soy, like tofu.  Sodium Salt (sodium) helps to keep a healthy balance of fluids in your body. Too much salt can increase your blood pressure, which can harm your heart and lungs. Extra salt can also cause your body to keep too much  fluid, making your kidneys work harder. You may need to limit or avoid foods that are high in salt, such as: Salt seasonings. Soy and teriyaki sauce. Packaged, precooked, cured, or processed meats, such as sausages or meat loaves. Sardines. Salted crackers and snack foods. Fast food. Canned soups and most canned foods. Pickled foods. Vegetable juice. Boxed mixes or ready-to-eat boxed meals and side dishes. Bottled dressings, sauces, and marinades. Talk with your dietitian about how much potassium, phosphorus, protein, and salt you may have each day. Helpful tips Read food labels  Check the amount of salt in foods. Limit foods that have salt or sodium listed among the first five ingredients. Try to eat low-salt foods. Check the ingredient list for added phosphorus or potassium. "Phos" in an ingredient is a sign that phosphorus has been added. Do not buy foods that are calcium-enriched or that have calcium added to them (are fortified). Buy canned vegetables and beans that say "no salt added" and rinse them before eating. Lifestyle Limit the amount of protein you eat from animal sources each day. Focus on protein from plant sources, like tofu and dried beans, peas, and lentils. Do not add salt to food when cooking or before eating. Do not eat star fruit. It can be toxic for people with kidney problems. Talk with your health care provider before taking any vitamin or mineral supplements. If told by your health care provider, track how much liquid you drink so you can avoid drinking too much. You may need to include foods you eat that are made mostly from water, like gelatin, ice cream, soups, and juicy fruits and vegetables. If you have diabetes: If you have diabetes (diabetes mellitus) and CKD, you need to keep your blood sugar (glucose) in the target range recommended by your health care provider. Follow your diabetes management plan. This may include: Checking your blood glucose  regularly. Taking medicines by mouth, or taking insulin, or both. Exercising for at least 30 minutes on 5 or more days each week, or as told by your health care provider. Tracking how many servings of carbohydrates you eat at each meal. Not using orange juice to treat low blood sugars. Instead, use apple juice, cranberry juice, or clear soda. You may be given guidelines on what foods and nutrients you may eat, and how much you can have each day. This depends on your stage of kidney disease and whether you have high blood pressure (hypertension). Follow the meal plan your dietitian gives you. To learn more: National Institute of Diabetes and Digestive and Kidney Diseases: niddk.nih.gov National Kidney Foundation: kidney.org Summary Chronic kidney disease (CKD) is when your kidneys are not working well. They cannot remove waste, fluids, and other substances from your blood the way they should. These substances can build up, which can worsen kidney damage and affect how your body works. Changing your diet can help prevent more kidney damage. Diet changes may also delay dialysis or even keep you from needing it. Diet changes are different for each person with CKD. Work with a dietitian to set up a meal plan that is right for you. This information is not intended   to replace advice given to you by your health care provider. Make sure you discuss any questions you have with your health care provider. Document Revised: 12/10/2021 Document Reviewed: 12/16/2019 Elsevier Patient Education  2023 Else[employer] Inc.  

## 2023-01-18 ENCOUNTER — Ambulatory Visit (INDEPENDENT_AMBULATORY_CARE_PROVIDER_SITE_OTHER): Payer: PPO | Admitting: Nurse Practitioner

## 2023-01-18 ENCOUNTER — Encounter: Payer: Self-pay | Admitting: Nurse Practitioner

## 2023-01-18 VITALS — BP 122/70 | HR 60 | Temp 98.1°F | Ht 63.19 in | Wt 127.7 lb

## 2023-01-18 DIAGNOSIS — F3342 Major depressive disorder, recurrent, in full remission: Secondary | ICD-10-CM | POA: Diagnosis not present

## 2023-01-18 DIAGNOSIS — I10 Essential (primary) hypertension: Secondary | ICD-10-CM

## 2023-01-18 DIAGNOSIS — E7800 Pure hypercholesterolemia, unspecified: Secondary | ICD-10-CM

## 2023-01-18 DIAGNOSIS — N1831 Chronic kidney disease, stage 3a: Secondary | ICD-10-CM

## 2023-01-18 DIAGNOSIS — F5104 Psychophysiologic insomnia: Secondary | ICD-10-CM

## 2023-01-18 DIAGNOSIS — M329 Systemic lupus erythematosus, unspecified: Secondary | ICD-10-CM

## 2023-01-18 DIAGNOSIS — M85852 Other specified disorders of bone density and structure, left thigh: Secondary | ICD-10-CM

## 2023-01-18 DIAGNOSIS — K2100 Gastro-esophageal reflux disease with esophagitis, without bleeding: Secondary | ICD-10-CM

## 2023-01-18 DIAGNOSIS — M85851 Other specified disorders of bone density and structure, right thigh: Secondary | ICD-10-CM

## 2023-01-18 LAB — MICROALBUMIN, URINE WAIVED
Creatinine, Urine Waived: 10 mg/dL (ref 10–300)
Microalb, Ur Waived: 10 mg/L (ref 0–19)

## 2023-01-18 MED ORDER — BUPROPION HCL ER (SR) 150 MG PO TB12: 150.0000 mg | ORAL_TABLET | Freq: Two times a day (BID) | ORAL | 4 refills | Status: DC

## 2023-01-18 NOTE — Assessment & Plan Note (Signed)
Chronic, ongoing.  Continue collaboration with nephrology and Benazepril for kidney protection.  CMP and urine ALB today. 

## 2023-01-18 NOTE — Assessment & Plan Note (Signed)
Chronic, ongoing.  Recommend she slowly cut back on PPI use, which she wishes to trial.  Decrease to every other day for 1-2 weeks, and the every third day, etc in continued pattern until at lowest dose tolerated or discontinued.  Risks of PPI use were discussed with patient including bone loss, C. Diff diarrhea, pneumonia, infections, CKD, electrolyte abnormalities. Verbalizes understanding and chooses to continue the medication.  Recheck Mag level today.

## 2023-01-18 NOTE — Assessment & Plan Note (Signed)
Chronic, ongoing.  Denies SI/HI.   Continue current medication regimen and adjust as needed.  She notices further benefit from this.  Refills up to date.

## 2023-01-18 NOTE — Assessment & Plan Note (Signed)
Chronic, stable.  BP at goal in office today.  Recommend she monitor BP at least a few mornings a week at home and document.  DASH diet at home.  Continue current medication regimen and adjust as needed.  Labs today: CBC, CMP, urine ALB.  Urine ALB 13 Jan 2023, continue Benazepril for kidney protection.  Return in 6 months.

## 2023-01-18 NOTE — Assessment & Plan Note (Signed)
Chronic, ongoing.  Continue current medication regimen and adjust as needed.  Recheck lipid panel today. 

## 2023-01-18 NOTE — Assessment & Plan Note (Signed)
Chronic, ongoing.  Last DEXA 02/28/2019, continue supplements at this time.  Plan on repeat DEXA in June 2025.  Recommend reduction of PPI use. 

## 2023-01-18 NOTE — Assessment & Plan Note (Signed)
Chronic, stable.  Continue current medication regimen and collaboration with dermatology at [facility].  Will send recent labs to them.

## 2023-01-18 NOTE — Progress Notes (Signed)
BP 122/70   Pulse 60   Temp 98.1 F (36.7 C) (Oral)   Ht 5' 3.19" (1.605 m)   Wt 127 lb 11.2 oz (57.9 kg)   SpO2 96%   BMI 22.49 kg/m    Subjective:    Patient ID: Lindsey Blair, female    DOB: 04-15-1946, 77 y.o.   MRN: [id number]  HPI: Lindsey Blair is a 77 y.o. female  Chief Complaint  Patient presents with   Hypertension   Lupus   Chronic Kidney Disease   Depression   HYPERTENSION / HYPERLIPIDEMIA Continues on Amlodipine-Benazepril + HCTZ and Atorvastatin.   Satisfied with current treatment? yes Duration of hypertension: chronic BP monitoring frequency: daily BP range: 120/70 range at home BP medication side effects: no Duration of hyperlipidemia: chronic Cholesterol medication side effects: no Cholesterol supplements: none Medication compliance: good compliance Aspirin: no Recent stressors: no Recurrent headaches: no Visual changes: no Palpitations: no Dyspnea: no Chest pain: no Lower extremity edema: occasional at night Dizzy/lightheaded: no  The 10-year ASCVD risk score (Arnett DK, et al., 2019) is: 24.1%   Values used to calculate the score:     Age: 75 years     Sex: Female     Is Non-Hispanic African American: No     Diabetic: No     Tobacco smoker: No     Systolic Blood Pressure: 122 mmHg     Is BP treated: Yes     HDL Cholesterol: 72 mg/dL     Total Cholesterol: 176 mg/dL  CHRONIC KIDNEY DISEASE Followed by nephrology, last visit 03/14/22 (Dr. Ludwig Lean).  Follows with Dr. Adolphus Birchwood, dermatology, for Lupus --- continues on Hydroxychloroquine -- saw last week and had labs in March 2024.  Continues on Protonix for heart burn, last Mag level slightly elevated, has stopped supplement. CKD status: stable Medications renally dose: yes Previous renal evaluation: yes Pneumovax:  Up to Date Influenza Vaccine:  Up to Date   OSTEOPENIA Last 02/28/2019 -- DEXA was T-score -2.0.   Satisfied with current treatment?: yes Adequate calcium & vitamin D:  yes Weight bearing exercises: yes   DEPRESSION Has Wellbutrin SR 150 MG BID.   Mood status: stable Satisfied with current treatment?: yes Symptom severity: mild  Duration of current treatment : chronic Side effects: no Medication compliance: good compliance Psychotherapy/counseling: none Depressed mood: no Anxious mood: no Anhedonia: no Significant weight loss or gain: no Insomnia: sleeping better Fatigue: no Feelings of worthlessness or guilt: no Impaired concentration/indecisiveness: no Suicidal ideations: no Hopelessness: no Crying spells: no    01/18/2023   10:00 AM 10/17/2022    8:20 AM 07/20/2022    9:58 AM 05/17/2022   11:00 AM 04/12/2022   10:26 AM  Depression screen PHQ 2/9  Decreased Interest 0 0 0 0 0  Down, Depressed, Hopeless 0 0 0 0 0  PHQ - 2 Score 0 0 0 0 0  Altered sleeping 1 0 0 1 1  Tired, decreased energy 0 0 0 1 0  Change in appetite 0 0 0 0 0  Feeling bad or failure about yourself  0 0 0 0 0  Trouble concentrating 0 0 0 0 0  Moving slowly or fidgety/restless 0 0 0 0 0  Suicidal thoughts 0 0 0 0   PHQ-9 Score 1 0 0 2 1  Difficult doing work/chores Not difficult at all Not difficult at all Not difficult at all Not difficult at all Not difficult at all  01/18/2023   10:01 AM 07/20/2022    9:59 AM 05/17/2022   11:00 AM 04/12/2022   10:27 AM  GAD 7 : Generalized Anxiety Score  Nervous, Anxious, on Edge 0 0 0 0  Control/stop worrying 0 0 0 0  Worry too much - different things 0 0 0 0  Trouble relaxing 0 0 1 0  Restless 0 0 0 0  Easily annoyed or irritable 0 0 1 1  Afraid - awful might happen 0 0 0 0  Total GAD 7 Score 0 0 2 1  Anxiety Difficulty Not difficult at all Not difficult at all Not difficult at all Not difficult at all   Relevant past medical, surgical, family and social history reviewed and updated as indicated. Interim medical history since our last visit reviewed. Allergies and medications reviewed and updated.  Review of Systems   Constitutional:  Negative for activity change, appetite change, diaphoresis, fatigue and fever.  Respiratory:  Negative for cough, chest tightness and shortness of breath.   Cardiovascular:  Negative for chest pain, palpitations and leg swelling.  Gastrointestinal: Negative.   Neurological: Negative.   Psychiatric/Behavioral: Negative.      Per HPI unless specifically indicated above     Objective:    BP 122/70   Pulse 60   Temp 98.1 F (36.7 C) (Oral)   Ht 5' 3.19" (1.605 m)   Wt 127 lb 11.2 oz (57.9 kg)   SpO2 96%   BMI 22.49 kg/m   Wt Readings from Last 3 Encounters:  01/18/23 127 lb 11.2 oz (57.9 kg)  10/17/22 128 lb (58.1 kg)  07/20/22 128 lb 11.2 oz (58.4 kg)    Physical Exam Vitals and nursing note reviewed.  Constitutional:      General: She is awake. She is not in acute distress.    Appearance: She is well-developed and well-groomed. She is not ill-appearing.  HENT:     Head: Normocephalic.     Right Ear: Hearing normal.     Left Ear: Hearing normal.  Eyes:     General: Lids are normal.        Right eye: No discharge.        Left eye: No discharge.     Conjunctiva/sclera: Conjunctivae normal.     Pupils: Pupils are equal, round, and reactive to light.  Neck:     Thyroid: No thyromegaly.     Vascular: No carotid bruit.  Cardiovascular:     Rate and Rhythm: Normal rate and regular rhythm.     Heart sounds: Normal heart sounds. No murmur heard.    No gallop.  Pulmonary:     Effort: Pulmonary effort is normal. No accessory muscle usage or respiratory distress.     Breath sounds: Normal breath sounds.  Abdominal:     General: Bowel sounds are normal.     Palpations: Abdomen is soft.  Musculoskeletal:     Cervical back: Normal range of motion and neck supple.     Right lower leg: No edema.     Left lower leg: No edema.  Skin:    General: Skin is warm and dry.  Neurological:     Mental Status: She is alert and oriented to person, place, and time.   Psychiatric:        Attention and Perception: Attention normal.        Mood and Affect: Mood normal.        Speech: Speech normal.  Behavior: Behavior normal. Behavior is cooperative.        Thought Content: Thought content normal.    Results for orders placed or performed in visit on 01/18/23  Microalbumin, Urine Waived  Result Value Ref Range   Microalb, Ur Waived 10 0 - 19 mg/L   Creatinine, Urine Waived 10 10 - 300 mg/dL   Microalb/Creat Ratio 30-300 (H) <30 mg/g      Assessment & Plan:   Problem List Items Addressed This Visit       Cardiovascular and Mediastinum   Hypertension    Chronic, stable.  BP at goal in office today.  Recommend she monitor BP at least a few mornings a week at home and document.  DASH diet at home.  Continue current medication regimen and adjust as needed.  Labs today: CBC, CMP, urine ALB.  Urine ALB 13 Jan 2023, continue Benazepril for kidney protection.  Return in 6 months.       Relevant Orders   Comprehensive metabolic panel     Digestive   Reflux esophagitis    Chronic, ongoing.  Recommend she slowly cut back on PPI use, which she wishes to trial.  Decrease to every other day for 1-2 weeks, and the every third day, etc in continued pattern until at lowest dose tolerated or discontinued.  Risks of PPI use were discussed with patient including bone loss, C. Diff diarrhea, pneumonia, infections, CKD, electrolyte abnormalities. Verbalizes understanding and chooses to continue the medication.  Recheck Mag level today.         Musculoskeletal and Integument   Osteopenia    Chronic, ongoing.  Last DEXA 02/28/2019, continue supplements at this time.  Plan on repeat DEXA in June 2025.  Recommend reduction of PPI use.        Genitourinary   CKD (chronic kidney disease) stage 3, GFR 30-59 ml/min (HCC) - Primary    Chronic, ongoing.  Continue collaboration with nephrology and Benazepril for kidney protection.  CMP and urine ALB today.         Other   Depression    Chronic, ongoing.  Denies SI/HI.   Continue current medication regimen and adjust as needed.  She notices further benefit from this.  Refills up to date.      Relevant Medications   buPROPion (WELLBUTRIN SR) 150 MG 12 hr tablet   Other Relevant Orders   Comprehensive metabolic panel   Magnesium   Microalbumin, Urine Waived (Completed)   Hyperlipidemia    Chronic, ongoing.  Continue current medication regimen and adjust as needed.  Recheck lipid panel today.      Relevant Orders   Comprehensive metabolic panel   Lipid Panel w/o Chol/HDL Ratio   Systemic lupus erythematosus (HCC)    Chronic, stable.  Continue current medication regimen and collaboration with dermatology at [facility].  Will send recent labs to them.      Relevant Orders   Comprehensive metabolic panel   CBC with Differential/Platelet     Follow up plan: Return in about 6 months (around 07/21/2023) for Annual physical after 07/21/23.

## 2023-01-19 LAB — COMPREHENSIVE METABOLIC PANEL
ALT: 17 IU/L (ref 0–32)
AST: 24 IU/L (ref 0–40)
Albumin/Globulin Ratio: 1.8 (ref 1.2–2.2)
Albumin: 4.2 g/dL (ref 3.8–4.8)
Alkaline Phosphatase: 73 IU/L (ref 44–121)
BUN/Creatinine Ratio: 14 (ref 12–28)
BUN: 17 mg/dL (ref 8–27)
Bilirubin Total: 0.4 mg/dL (ref 0.0–1.2)
CO2: 23 mmol/L (ref 20–29)
Calcium: 9.9 mg/dL (ref 8.7–10.3)
Chloride: 102 mmol/L (ref 96–106)
Creatinine, Ser: 1.24 mg/dL — ABNORMAL HIGH (ref 0.57–1.00)
Globulin, Total: 2.4 g/dL (ref 1.5–4.5)
Glucose: 84 mg/dL (ref 70–99)
Potassium: 4.3 mmol/L (ref 3.5–5.2)
Sodium: 138 mmol/L (ref 134–144)
Total Protein: 6.6 g/dL (ref 6.0–8.5)
eGFR: 45 mL/min/{1.73_m2} — ABNORMAL LOW (ref >59)

## 2023-01-19 LAB — CBC WITH DIFFERENTIAL/PLATELET
Basophils Absolute: 0 10*3/uL (ref 0.0–0.2)
Basos: 1 %
EOS (ABSOLUTE): 0.1 10*3/uL (ref 0.0–0.4)
Eos: 2 %
Hematocrit: 43.9 % (ref 34.0–46.6)
Hemoglobin: 14.1 g/dL (ref 11.1–15.9)
Immature Grans (Abs): 0 10*3/uL (ref 0.0–0.1)
Immature Granulocytes: 0 %
Lymphocytes Absolute: 1.4 10*3/uL (ref 0.7–3.1)
Lymphs: 30 %
MCH: 28.1 pg (ref 26.6–33.0)
MCHC: 32.1 g/dL (ref 31.5–35.7)
MCV: 88 fL (ref 79–97)
Monocytes Absolute: 0.4 10*3/uL (ref 0.1–0.9)
Monocytes: 9 %
Neutrophils Absolute: 2.7 10*3/uL (ref 1.4–7.0)
Neutrophils: 58 %
Platelets: 176 10*3/uL (ref 150–450)
RBC: 5.01 x10E6/uL (ref 3.77–5.28)
RDW: 11.6 % — ABNORMAL LOW (ref 11.7–15.4)
WBC: 4.7 10*3/uL (ref 3.4–10.8)

## 2023-01-19 LAB — LIPID PANEL W/O CHOL/HDL RATIO
Cholesterol, Total: 159 mg/dL (ref 100–199)
HDL: 70 mg/dL (ref >39)
LDL Chol Calc (NIH): 72 mg/dL (ref 0–99)
Triglycerides: 94 mg/dL (ref 0–149)
VLDL Cholesterol Cal: 17 mg/dL (ref 5–40)

## 2023-01-19 LAB — MAGNESIUM: Magnesium: 2.2 mg/dL (ref 1.6–2.3)

## 2023-01-19 NOTE — Progress Notes (Signed)
Good morning, please let Carney Bern know her labs have returned: - Kidney function continues to show Stage 3a to 3b kidney disease with no worsening.  Continue visits with kidney doctor and I will forward these labs to him to alert him they were drawn.  Liver function is normal. - CBC shows no anemia or infection. - Cholesterol labs look great and magnesium level normal.  Continue all current medications.  Any questions? Keep being amazing!!  Thank you for allowing me to participate in your care.  I appreciate you. Kindest regards, Hezakiah Champeau

## 2023-01-19 NOTE — Progress Notes (Signed)
Good morning, this patient is scheduled to see you on 03/08/23.  Wanted to alert you we did get CMP yesterday.  Function is remaining stable at her baseline.  Have a great day!!

## 2023-02-13 ENCOUNTER — Other Ambulatory Visit: Payer: Self-pay

## 2023-02-13 MED ORDER — TESTOSTERONE 1.62 % TD GEL: 1.0000 "application " | TRANSDERMAL | 2 refills | Status: DC

## 2023-02-13 NOTE — Telephone Encounter (Signed)
Medication refill for Testosterone 4% Gel last ov 01/18/23, upcoming ov 07/26/23 . Please advise

## 2023-02-14 MED ORDER — TESTOSTERONE COMPOUNDING KIT 20 % TD CREA: TOPICAL_CREAM | TRANSDERMAL | 0 refills | Status: DC

## 2023-02-14 NOTE — Addendum Note (Signed)
Addended by: Aura Dials T on: 02/14/2023 02:37 PM   Modules accepted: Orders

## 2023-02-17 ENCOUNTER — Telehealth: Payer: Self-pay | Admitting: Nurse Practitioner

## 2023-02-17 NOTE — Telephone Encounter (Signed)
Copied from CRM [id number]. Topic: General - Other >> Feb 17, 2023  4:01 PM Franchot Heidelberg wrote: Reason for CRM: Pt called requesting that her testosterone cream that was approved/printed on 02/14/2023 get called into [facility] [place] - [address] [place] [address] Phone: [phone] Fax: [phone]

## 2023-02-20 NOTE — Telephone Encounter (Signed)
Patient was informed that her prescription was faxed over to [facility] in [place]. Patient verbalized understanding

## 2023-03-06 ENCOUNTER — Other Ambulatory Visit: Payer: Self-pay | Admitting: Unknown Physician Specialty

## 2023-03-07 NOTE — Telephone Encounter (Signed)
Requested medication (s) are due for refill today: yes  Requested medication (s) are on the active medication list: yes  Last refill:  03/15/22 #30/2  Future visit scheduled: yes  Notes to clinic:  Unable to refill per protocol, cannot delegate.    Requested Prescriptions  Pending Prescriptions Disp Refills   tiZANidine (ZANAFLEX) 4 MG tablet [Pharmacy Med Name: TIZANIDINE HCL 4 MG TABLET] 30 tablet 0    Sig: Take 1 tablet (4 mg total) by mouth every 6 (six) hours as needed for muscle spasms.     Not Delegated - Cardiovascular:  Alpha-2 Agonists - tizanidine Failed - 03/06/2023 12:51 PM      Failed - This refill cannot be delegated      Passed - Valid encounter within last 6 months    Recent Outpatient Visits           1 month ago Stage 3a chronic kidney disease (HCC)   [place] [facility] [place], Dorie Rank, NP   7 months ago Primary hypertension   [place] [facility] [place], [place] T, NP   9 months ago Recurrent major depressive disorder, in full remission [facility])   [place] [facility] Gabriel Cirri, NP   10 months ago Psychophysiological insomnia   [facility] Gabriel Cirri, NP   11 months ago Psychophysiological insomnia   [place] [facility] Gabriel Cirri, NP       Future Appointments             In 4 months Cannady, Dorie Rank, NP [place] [facility]

## 2023-03-08 ENCOUNTER — Encounter: Payer: Self-pay | Admitting: Podiatry

## 2023-03-08 ENCOUNTER — Ambulatory Visit: Payer: PPO | Admitting: Podiatry

## 2023-03-08 DIAGNOSIS — L603 Nail dystrophy: Secondary | ICD-10-CM | POA: Diagnosis not present

## 2023-03-08 MED ORDER — ITRACONAZOLE 100 MG PO CAPS: ORAL_CAPSULE | ORAL | 0 refills | Status: DC

## 2023-03-08 NOTE — Progress Notes (Signed)
She presents today for follow-up of her itraconazole treatment for her onychomycosis of the right hallux nail plate.  She had no problems taking the medication she is not sure if it continues to grow out or not.  Objective: Vital signs are stable alert and oriented x 3 pulses are palpable.  Nail plate appears to be healing very nicely there is no erythema cellulitis drainage or odor there is still some distal discoloration approximately half of the nail has gone on to heal uneventfully.  Assessment slowly healing onychomycosis with long-term therapy with itraconazole pulsed dose.  Plan: At this point were going to continue the pulsed dose 1 week on every month and then 3 weeks of every month.  She will continue to do this for the next 3 months she is going take a picture of her toenail today before she repainted.

## 2023-03-14 ENCOUNTER — Other Ambulatory Visit: Payer: Self-pay | Admitting: Nurse Practitioner

## 2023-03-14 DIAGNOSIS — Z1231 Encounter for screening mammogram for malignant neoplasm of breast: Secondary | ICD-10-CM

## 2023-04-07 ENCOUNTER — Ambulatory Visit
Admission: RE | Admit: 2023-04-07 | Discharge: 2023-04-07 | Disposition: A | Discharge status: Home / Self Care | Payer: PPO | Source: Ambulatory Visit | Attending: Nurse Practitioner | Admitting: Nurse Practitioner

## 2023-04-07 DIAGNOSIS — Z1231 Encounter for screening mammogram for malignant neoplasm of breast: Secondary | ICD-10-CM | POA: Insufficient documentation

## 2023-04-10 NOTE — Progress Notes (Signed)
Contacted via MyChart   Normal mammogram, may repeat in one year:)

## 2023-06-12 ENCOUNTER — Ambulatory Visit: Payer: PPO | Admitting: Podiatry

## 2023-06-14 ENCOUNTER — Encounter: Payer: Self-pay | Admitting: Podiatry

## 2023-06-14 ENCOUNTER — Ambulatory Visit: Payer: PPO | Admitting: Podiatry

## 2023-06-14 DIAGNOSIS — L603 Nail dystrophy: Secondary | ICD-10-CM | POA: Diagnosis not present

## 2023-06-14 NOTE — Progress Notes (Signed)
She presents today for follow-up of her nail fungus.  She states that she had no problem taking all of her itraconazole.  States that the toenail looks a little lighter but is really not been that much change.  Objective: Vital signs are stable she is alert and oriented x 3.  Pulses are palpable.  Hallux right does not demonstrate significant change in the nail plate.  Assessment: Long-term therapy with itraconazole for onychomycosis.  Plan: Follow-up with [place] on an as-needed basis when I discontinue the use of the itraconazole

## 2023-06-22 ENCOUNTER — Other Ambulatory Visit: Payer: Self-pay | Admitting: Nurse Practitioner

## 2023-06-22 NOTE — Telephone Encounter (Signed)
Requested medications are due for refill today.  unsure  Requested medications are on the active medications list.  yes  Last refill. 02/04/2023 30g 0 rf  Future visit scheduled.   yes  Notes to clinic.  No protocol. Also please review previous rx. Provider indicated this should be 4%.    Requested Prescriptions  Pending Prescriptions Disp Refills   Testosterone Compounding Kit 20 % CREA [Pharmacy Med Name: TESTOSTERONE 4% GEL] 30 g     Sig: APPLY 1 APPLICATION ONTO SKIN 3 TIMES A WEEK     There is no refill protocol information for this order

## 2023-06-26 ENCOUNTER — Other Ambulatory Visit: Payer: Self-pay | Admitting: Nurse Practitioner

## 2023-06-26 ENCOUNTER — Telehealth: Payer: Self-pay | Admitting: Nurse Practitioner

## 2023-06-26 MED ORDER — TESTOSTERONE 1.62 % TD GEL: TRANSDERMAL | 1 refills | Status: DC

## 2023-06-26 NOTE — Telephone Encounter (Signed)
Called [facility]. Dois Davenport states that they received a RX for Testosterone 20% cream for the patient but patient has been using 4% TID. Reviewed chart and do not see where the dose was supposed to be increased. Please clarify and send in correct dose.

## 2023-06-26 NOTE — Telephone Encounter (Signed)
Copied from CRM [id number]. Topic: General - Other >> Jun 26, 2023  9:16 AM Everette C wrote: Reason for CRM: Dois Davenport with [facility] has called requesting contact with a member of staff regarding the patient's recent prescription for Testosterone Compounding Kit 20 % CREA [[id number]]  Please contact further when possible

## 2023-06-27 ENCOUNTER — Other Ambulatory Visit: Payer: Self-pay

## 2023-06-27 MED ORDER — TESTOSTERONE 1.62 % TD GEL: TRANSDERMAL | 1 refills | Status: DC

## 2023-06-27 NOTE — Telephone Encounter (Signed)
RX for Testosterone was sent to [facility]. Can we resend to [facility] please?

## 2023-07-05 ENCOUNTER — Other Ambulatory Visit: Payer: Self-pay | Admitting: Nurse Practitioner

## 2023-07-05 NOTE — Telephone Encounter (Signed)
Requested by interface surescripts. Future visit in 3 weeks. Last refill.  Requested Prescriptions  Pending Prescriptions Disp Refills   amLODipine-benazepril (LOTREL) 10-20 MG capsule [Pharmacy Med Name: AMLOD/BENAZEPRIL 10/20MG ] 90 capsule 0    Sig: Take 1 capsule by mouth daily.     Cardiovascular: CCB + ACEI Combos Failed - 07/05/2023  9:09 AM      Failed - Cr in normal range and within 180 days    Creatinine  Date Value Ref Range Status  08/11/2020 76.6 20.0 - 300.0 mg/dL Final   Creatinine, Ser  Date Value Ref Range Status  01/18/2023 1.24 (H) 0.57 - 1.00 mg/dL Final         Passed - K in normal range and within 180 days    Potassium  Date Value Ref Range Status  01/18/2023 4.3 3.5 - 5.2 mmol/L Final         Passed - Na in normal range and within 180 days    Sodium  Date Value Ref Range Status  01/18/2023 138 134 - 144 mmol/L Final         Passed - eGFR is 30 or above and within 180 days    GFR calc Af Amer  Date Value Ref Range Status  08/11/2020 59 (L) >59 mL/min/1.73 Final    Comment:    **In accordance with recommendations from the NKF-ASN Task force,**   [facility] is in the process of updating its eGFR calculation to the   2021 CKD-EPI creatinine equation that estimates kidney function   without a race variable.    GFR calc non Af Amer  Date Value Ref Range Status  08/11/2020 51 (L) >59 mL/min/1.73 Final   eGFR  Date Value Ref Range Status  01/18/2023 45 (L) >59 mL/min/1.73 Final         Passed - Patient is not pregnant      Passed - Last BP in normal range    BP Readings from Last 1 Encounters:  01/18/23 122/70         Passed - Valid encounter within last 6 months    Recent Outpatient Visits           5 months ago Stage 3a chronic kidney disease (HCC)   [place] [facility] [place], Dorie Rank, NP   11 months ago Primary hypertension   [place] [facility] [place], [place] T, NP   1 year ago Recurrent major  depressive disorder, in full remission (HCC)   [facility] Gabriel Cirri, NP   1 year ago Psychophysiological insomnia   [place] [facility] Gabriel Cirri, NP   1 year ago Psychophysiological insomnia   [place] [facility] Gabriel Cirri, NP       Future Appointments             In 3 weeks Cannady, Dorie Rank, NP  [facility]   In 3 weeks Bethanie Dicker, NP [facility], [facility]

## 2023-07-26 ENCOUNTER — Encounter: Payer: PPO | Admitting: Nurse Practitioner

## 2023-08-01 ENCOUNTER — Ambulatory Visit: Payer: PPO | Admitting: Nurse Practitioner

## 2023-08-01 ENCOUNTER — Encounter: Payer: Self-pay | Admitting: Nurse Practitioner

## 2023-08-01 VITALS — BP 122/68 | HR 63 | Temp 98.3°F | Ht 61.5 in | Wt 127.6 lb

## 2023-08-01 DIAGNOSIS — G8929 Other chronic pain: Secondary | ICD-10-CM

## 2023-08-01 DIAGNOSIS — Z23 Encounter for immunization: Secondary | ICD-10-CM

## 2023-08-01 DIAGNOSIS — F3342 Major depressive disorder, recurrent, in full remission: Secondary | ICD-10-CM | POA: Diagnosis not present

## 2023-08-01 DIAGNOSIS — M25551 Pain in right hip: Secondary | ICD-10-CM | POA: Diagnosis not present

## 2023-08-01 DIAGNOSIS — G250 Essential tremor: Secondary | ICD-10-CM

## 2023-08-01 DIAGNOSIS — I10 Essential (primary) hypertension: Secondary | ICD-10-CM | POA: Diagnosis not present

## 2023-08-01 DIAGNOSIS — M329 Systemic lupus erythematosus, unspecified: Secondary | ICD-10-CM

## 2023-08-01 DIAGNOSIS — E7800 Pure hypercholesterolemia, unspecified: Secondary | ICD-10-CM

## 2023-08-01 DIAGNOSIS — N1831 Chronic kidney disease, stage 3a: Secondary | ICD-10-CM

## 2023-08-01 DIAGNOSIS — M25552 Pain in left hip: Secondary | ICD-10-CM

## 2023-08-01 MED ORDER — BUPROPION HCL ER (SR) 200 MG PO TB12: 200.0000 mg | ORAL_TABLET | Freq: Two times a day (BID) | ORAL | 3 refills | Status: DC

## 2023-08-01 MED ORDER — TRAMADOL HCL 50 MG PO TABS: 50.0000 mg | ORAL_TABLET | Freq: Three times a day (TID) | ORAL | 0 refills | Status: DC | PRN

## 2023-08-01 MED ORDER — PROPRANOLOL HCL 20 MG PO TABS: 20.0000 mg | ORAL_TABLET | Freq: Two times a day (BID) | ORAL | 3 refills | Status: DC

## 2023-08-01 NOTE — Progress Notes (Signed)
Bethanie Dicker, NP-C Phone: [phone]  Lindsey Blair is a 77 y.o. female who presents today to establish care.   Discussed the use of AI scribe software for clinical note transcription with the patient, who gave verbal consent to proceed.  History of Present Illness   The patient, with a history of hypertension, chronic kidney disease, and lupus, presents with complaints of persistent lower extremity edema despite being on hydrochlorothiazide. The swelling is noted to be worse in the afternoons and improves with reclining. She also reports a long-standing issue of tremors, primarily at rest, affecting her entire body but most noticeable in her hands. The tremors are familial and do not interfere with her daily activities, including eating and drinking.  The patient also reports chronic hip pain, more pronounced on the right side, which has been ongoing for approximately four years. The pain is severe enough to interfere with her sleep and daily activities. She has received multiple injections and has tried meloxicam without significant relief. She also has a history of a severe muscle spasm on the right side, which still causes discomfort.  Regarding her mood, the patient has been on Wellbutrin for depression for about twenty years. She reports occasional periods of feeling down and suggests that her mood issues may be a mix of anxiety and depression. She has tried increasing the dose of Wellbutrin in the past but encountered issues with insurance coverage for the higher dose.  The patient's hypertension, cholesterol, and kidney disease are reportedly well-managed with her current medication regimen, which includes Norvasc, benazepril, Lipitor, and hydroxychloroquine. She also takes a vitamin D supplement. She has regular follow-ups with nephrology and dermatology for her chronic conditions.      Active Ambulatory Problems    Diagnosis Date Noted   Hyperlipidemia    Depression     Hypertension 04/27/2015   Reflux esophagitis    Atrophic vaginitis 07/20/2016   Insomnia 03/06/2017   Advanced care planning/counseling discussion 03/06/2017   CKD (chronic kidney disease) stage 3, GFR 30-59 ml/min (HCC) 08/11/2019   Systemic lupus erythematosus (HCC) 08/11/2019   Osteopenia 08/12/2019   Back pain 02/10/2020   Trochanteric bursitis of right hip 10/28/2021   Benign essential tremor 08/01/2023   Chronic hip pain, bilateral 08/01/2023   Resolved Ambulatory Problems    Diagnosis Date Noted   Hip tendonitis 04/27/2015   Special screening for malignant neoplasms, colon    Abdominal pain, epigastric    Gastritis    IT band syndrome 01/06/2016   Cutaneous lupus erythematosus 01/06/2016   Restless leg syndrome 06/12/2017   Vaginal dryness, menopausal 10/03/2019   Vaginal dryness, menopausal 09/22/2021   Need for influenza vaccination 09/22/2021   Right hip pain 10/12/2021   Past Medical History:  Diagnosis Date   Anxiety    Cancer (HCC) Several instances of the years   Chronic kidney disease    Vertigo     Family History  Problem Relation Age of Onset   Heart disease Mother    Leukemia Father    Stroke Father    Cerebral aneurysm Sister    Prostate cancer Brother    Prostate cancer Brother    Prostate cancer Brother    Lung cancer Sister    Bladder Cancer Sister    Liver cancer Sister    Brain cancer Sister    Heart attack Sister    Breast cancer Neg Hx     Social History   Socioeconomic History   Marital status: Married  Spouse name: Not on file   Number of children: Not on file   Years of education: Not on file   Highest education level: Some college, no degree  Occupational History   Occupation: retired   Tobacco Use   Smoking status: Former    Current packs/day: 0.00    Types: Cigarettes    Quit date: 03/08/1965    Years since quitting: 58.4   Smokeless tobacco: Never  Vaping Use   Vaping status: Never Used  Substance and Sexual  Activity   Alcohol use: Yes    Alcohol/week: 5.0 standard drinks of alcohol    Types: 2 Glasses of wine, 3 Shots of liquor per week    Comment: daily   Drug use: No   Sexual activity: Yes  Other Topics Concern   Not on file  Social History Narrative   Not on file   Social Determinants of Health   Financial Resource Strain: Low Risk  (10/17/2022)   Overall Financial Resource Strain (CARDIA)    Difficulty of Paying Living Expenses: Not hard at all  Food Insecurity: No Food Insecurity (10/17/2022)   Hunger Vital Sign    Worried About Running Out of Food in the Last Year: Never true    Ran Out of Food in the Last Year: Never true  Transportation Needs: No Transportation Needs (10/17/2022)   PRAPARE - Administrator, Civil Service (Medical): No    Lack of Transportation (Non-Medical): No  Physical Activity: Insufficiently Active (10/17/2022)   Exercise Vital Sign    Days of Exercise per Week: 3 days    Minutes of Exercise per Session: 30 min  Stress: No Stress Concern Present (10/17/2022)   [employer] of Occupational Health - Occupational Stress Questionnaire    Feeling of Stress : Not at all  Social Connections: Unknown (10/17/2022)   Social Connection and Isolation Panel [NHANES]    Frequency of Communication with Friends and Family: More than three times a week    Frequency of Social Gatherings with Friends and Family: More than three times a week    Attends Religious Services: More than 4 times per year    Active Member of [employer] or Organizations: No    Attends Banker Meetings: Never    Marital Status: Not on file  Intimate Partner Violence: Not At Risk (10/17/2022)   Humiliation, Afraid, Rape, and Kick questionnaire    Fear of Current or Ex-Partner: No    Emotionally Abused: No    Physically Abused: No    Sexually Abused: No    ROS  General:  Negative for unexplained weight loss, fever Skin: Negative for new or changing mole, sore that won't  heal HEENT: Negative for trouble hearing, trouble seeing, ringing in ears, mouth sores, hoarseness, change in voice, dysphagia. CV:  Negative for chest pain, dyspnea, palpitations Resp: Negative for cough, dyspnea, hemoptysis GI: Negative for nausea, vomiting, diarrhea, constipation, abdominal pain, melena, hematochezia. GU: Negative for dysuria, incontinence, urinary hesitance, hematuria, vaginal or penile discharge, polyuria, sexual difficulty, lumps in testicle or breasts MSK: Negative for muscle cramps or aches Neuro: Negative for headaches, weakness, numbness, dizziness, passing out/fainting Psych: Negative for depression, anxiety, memory problems  Objective  Physical Exam Vitals:   08/01/23 1405  BP: 122/68  Pulse: 63  Temp: 98.3 F (36.8 C)  SpO2: 96%    BP Readings from Last 3 Encounters:  08/01/23 122/68  01/18/23 122/70  07/20/22 136/79   Wt Readings from Last 3  Encounters:  08/01/23 127 lb 9.6 oz (57.9 kg)  01/18/23 127 lb 11.2 oz (57.9 kg)  10/17/22 128 lb (58.1 kg)    Physical Exam Constitutional:      General: She is not in acute distress.    Appearance: Normal appearance.  HENT:     Head: Normocephalic.  Cardiovascular:     Rate and Rhythm: Normal rate and regular rhythm.     Heart sounds: Murmur (chronic) heard.  Pulmonary:     Effort: Pulmonary effort is normal.     Breath sounds: Normal breath sounds.  Musculoskeletal:     Right lower leg: No edema.     Left lower leg: No edema.  Skin:    General: Skin is warm and dry.  Neurological:     General: No focal deficit present.     Mental Status: She is alert.  Psychiatric:        Mood and Affect: Mood normal.        Behavior: Behavior normal.    Assessment/Plan:   Benign essential tremor Assessment & Plan: She reports a family history of tremors, mostly at rest, but no difficulty with eating or drinking. We will start Propranolol 20 mg BID for help with tremor control. We will continue to  monitor.   Orders: -     Propranolol HCl; Take 1 tablet (20 mg total) by mouth 2 (two) times daily.  Dispense: 180 tablet; Refill: 3  Chronic hip pain, bilateral Assessment & Plan: She reports chronic right hip pain, possibly due to a previous muscle spasm, affecting her sleep. She has tried meloxicam and tizanidine with limited relief. Trying to avoid NSAIDs due to CKD. We will start a trial of Tramadol 50 mg TID PRN for pain control and call her in one week to assess her response to Tramadol.  Orders: -     traMADol HCl; Take 1 tablet (50 mg total) by mouth every 8 (eight) hours as needed.  Dispense: 30 tablet; Refill: 0  Recurrent major depressive disorder, in full remission (HCC) Assessment & Plan: Despite being on Wellbutrin 150 mg BID, she reports occasional low mood. We discussed the possibility of increasing the dose or changing the medication. We will increase the Wellbutrin dose to 200 mg BID and call her in one week to assess her response to the increased dose. Counseled patient on common side effects. Encouraged to contact if worsening symptoms, unusual behavior changes or suicidal thoughts occur.   Orders: -     buPROPion HCl ER (SR); Take 1 tablet (200 mg total) by mouth 2 (two) times daily.  Dispense: 180 tablet; Refill: 3  Primary hypertension Assessment & Plan: Her hypertension is well controlled on Norvasc 10 mg daily and Benazepril 20 mg daily. Blood pressure at goal today in office. We will continue the current medications.   Stage 3a chronic kidney disease [facility]) Assessment & Plan: She is followed by nephrology, last office visit July 2024. We will continue the current medication regimen and follow-up with nephrology as scheduled.    Systemic lupus erythematosus, unspecified SLE type, unspecified organ involvement status [facility]) Assessment & Plan: Managed by [facility] with hydroxychloroquine. Stable at this time. We will continue the current management and  follow-up with Dermatology as scheduled.    Pure hypercholesterolemia Assessment & Plan: Her last LDL was 72, well controlled on Lipitor 20 mg daily. We will continue the current medication and plan to recheck lipid panel in the future.    Need for  influenza vaccination -     Flu Vaccine Trivalent High Dose (Fluad)    Return in about 6 months (around 01/29/2024) for Follow up.   Bethanie Dicker, NP-C [facility] [employer]

## 2023-08-08 ENCOUNTER — Encounter: Payer: Self-pay | Admitting: Nurse Practitioner

## 2023-08-08 NOTE — Assessment & Plan Note (Addendum)
Her last LDL was 72, well controlled on Lipitor 20 mg daily. We will continue the current medication and plan to recheck lipid panel in the future.

## 2023-08-08 NOTE — Assessment & Plan Note (Addendum)
Managed by [facility] with hydroxychloroquine. Stable at this time. We will continue the current management and follow-up with Dermatology as scheduled.

## 2023-08-08 NOTE — Assessment & Plan Note (Signed)
She reports a family history of tremors, mostly at rest, but no difficulty with eating or drinking. We will start Propranolol 20 mg BID for help with tremor control. We will continue to monitor.

## 2023-08-08 NOTE — Assessment & Plan Note (Signed)
Despite being on Wellbutrin 150 mg BID, she reports occasional low mood. We discussed the possibility of increasing the dose or changing the medication. We will increase the Wellbutrin dose to 200 mg BID and call her in one week to assess her response to the increased dose. Counseled patient on common side effects. Encouraged to contact if worsening symptoms, unusual behavior changes or suicidal thoughts occur.

## 2023-08-08 NOTE — Assessment & Plan Note (Signed)
She is followed by nephrology, last office visit July 2024. We will continue the current medication regimen and follow-up with nephrology as scheduled.

## 2023-08-08 NOTE — Assessment & Plan Note (Signed)
She reports chronic right hip pain, possibly due to a previous muscle spasm, affecting her sleep. She has tried meloxicam and tizanidine with limited relief. Trying to avoid NSAIDs due to CKD. We will start a trial of Tramadol 50 mg TID PRN for pain control and call her in one week to assess her response to Tramadol.

## 2023-08-08 NOTE — Assessment & Plan Note (Signed)
Her hypertension is well controlled on Norvasc 10 mg daily and Benazepril 20 mg daily. Blood pressure at goal today in office. We will continue the current medications.

## 2023-08-15 ENCOUNTER — Other Ambulatory Visit: Payer: Self-pay | Admitting: Nurse Practitioner

## 2023-08-15 DIAGNOSIS — E7800 Pure hypercholesterolemia, unspecified: Secondary | ICD-10-CM

## 2023-08-16 NOTE — Telephone Encounter (Signed)
No longer patient at practice Requested Prescriptions  Pending Prescriptions Disp Refills   atorvastatin (LIPITOR) 20 MG tablet [Pharmacy Med Name: ATORVASTATIN 20 MG TABLET] 90 tablet 0    Sig: Take 1 tablet (20 mg total) by mouth daily.     Cardiovascular:  Antilipid - Statins Failed - 08/15/2023  9:24 AM      Failed - Lipid Panel in normal range within the last 12 months    Cholesterol, Total  Date Value Ref Range Status  01/18/2023 159 100 - 199 mg/dL Final   Cholesterol Piccolo, Waived  Date Value Ref Range Status  07/20/2016 176 <200 mg/dL Final    Comment:                            Desirable                <200                         Borderline High      200- 239                         High                     >239    LDL Chol Calc (NIH)  Date Value Ref Range Status  01/18/2023 72 0 - 99 mg/dL Final   HDL  Date Value Ref Range Status  01/18/2023 70 >39 mg/dL Final   Triglycerides  Date Value Ref Range Status  01/18/2023 94 0 - 149 mg/dL Final   Triglycerides Piccolo,Waived  Date Value Ref Range Status  07/20/2016 175 (H) <150 mg/dL Final    Comment:                            Normal                   <150                         Borderline High     150 - 199                         High                200 - 499                         Very High                >499          Passed - Patient is not pregnant      Passed - Valid encounter within last 12 months    Recent Outpatient Visits           7 months ago Stage 3a chronic kidney disease (HCC)   [place] [facility] [place], Corrie Dandy T, NP   1 year ago Primary hypertension   [place] [facility] [place], Bagley T, NP   1 year ago Recurrent major depressive disorder, in full remission (HCC)   [place] [facility] Gabriel Cirri, NP   1 year ago Psychophysiological insomnia   [place] [facility] Gabriel Cirri, NP  1 year ago  Psychophysiological insomnia   [place] [facility] Gabriel Cirri, NP       Future Appointments             In 5 months Bethanie Dicker, NP [facility] [place], [facility]

## 2023-09-07 ENCOUNTER — Other Ambulatory Visit: Payer: Self-pay | Admitting: Nurse Practitioner

## 2023-09-11 ENCOUNTER — Other Ambulatory Visit: Payer: Self-pay | Admitting: Nurse Practitioner

## 2023-09-11 ENCOUNTER — Telehealth: Payer: Self-pay

## 2023-09-11 MED ORDER — PANTOPRAZOLE SODIUM 40 MG PO TBEC: 40.0000 mg | DELAYED_RELEASE_TABLET | Freq: Every day | ORAL | 3 refills | Status: DC

## 2023-09-11 NOTE — Telephone Encounter (Signed)
 Copied from CRM [id number]. Topic: Clinical - Medication Refill >> Sep 11, 2023  9:31 AM Isabell A wrote: Most Recent Primary Care Visit:  Provider: GRETEL APP  Department: [facility]  Visit Type: NEW PATIENT  Date: 08/01/2023  Medication: pantoprazole  (PROTONIX ) 40 MG tablet   Has the patient contacted their pharmacy? Yes (Agent: If no, request that the patient contact the pharmacy for the refill. If patient does not wish to contact the pharmacy document the reason why and proceed with request.) (Agent: If yes, when and what did the pharmacy advise?)  Is this the correct pharmacy for this prescription? Yes If no, delete pharmacy and type the correct one.  This is the patient's preferred pharmacy:  [facility] - [place], [place] - [address] [place] [address] Phone: [phone] Fax: [phone]   Has the prescription been filled recently? Yes  Is the patient out of the medication? Yes  Has the patient been seen for an appointment in the last year OR does the patient have an upcoming appointment? Yes  Can we respond through MyChart? No  Agent: Please be advised that Rx refills may take up to 3 business days. We ask that you follow-up with your pharmacy.

## 2023-09-11 NOTE — Telephone Encounter (Signed)
 Refill sent.

## 2023-09-11 NOTE — Addendum Note (Signed)
 Addended by: Donavan Foil on: 09/11/2023 11:19 AM   Modules accepted: Orders

## 2023-09-12 DIAGNOSIS — R319 Hematuria, unspecified: Secondary | ICD-10-CM | POA: Diagnosis not present

## 2023-09-12 DIAGNOSIS — I129 Hypertensive chronic kidney disease with stage 1 through stage 4 chronic kidney disease, or unspecified chronic kidney disease: Secondary | ICD-10-CM | POA: Diagnosis not present

## 2023-09-12 DIAGNOSIS — M329 Systemic lupus erythematosus, unspecified: Secondary | ICD-10-CM | POA: Diagnosis not present

## 2023-09-12 DIAGNOSIS — R809 Proteinuria, unspecified: Secondary | ICD-10-CM | POA: Diagnosis not present

## 2023-09-12 DIAGNOSIS — N1831 Chronic kidney disease, stage 3a: Secondary | ICD-10-CM | POA: Diagnosis not present

## 2023-09-19 DIAGNOSIS — R319 Hematuria, unspecified: Secondary | ICD-10-CM | POA: Diagnosis not present

## 2023-09-19 DIAGNOSIS — N1831 Chronic kidney disease, stage 3a: Secondary | ICD-10-CM | POA: Diagnosis not present

## 2023-09-19 DIAGNOSIS — I129 Hypertensive chronic kidney disease with stage 1 through stage 4 chronic kidney disease, or unspecified chronic kidney disease: Secondary | ICD-10-CM | POA: Diagnosis not present

## 2023-09-19 DIAGNOSIS — R809 Proteinuria, unspecified: Secondary | ICD-10-CM | POA: Diagnosis not present

## 2023-09-19 DIAGNOSIS — M3210 Systemic lupus erythematosus, organ or system involvement unspecified: Secondary | ICD-10-CM | POA: Diagnosis not present

## 2023-09-29 ENCOUNTER — Other Ambulatory Visit: Payer: Self-pay | Admitting: Nurse Practitioner

## 2023-09-29 MED ORDER — AMLODIPINE BESY-BENAZEPRIL HCL 10-20 MG PO CAPS: 1.0000 | ORAL_CAPSULE | Freq: Every day | ORAL | 3 refills | Status: AC

## 2023-10-10 ENCOUNTER — Ambulatory Visit (INDEPENDENT_AMBULATORY_CARE_PROVIDER_SITE_OTHER): Payer: HMO | Admitting: *Deleted

## 2023-10-10 VITALS — Ht 61.5 in | Wt 120.0 lb

## 2023-10-10 DIAGNOSIS — Z0000 Encounter for general adult medical examination without abnormal findings: Secondary | ICD-10-CM | POA: Diagnosis not present

## 2023-10-10 DIAGNOSIS — Z780 Asymptomatic menopausal state: Secondary | ICD-10-CM

## 2023-10-10 NOTE — Patient Instructions (Signed)
 Lindsey Blair , Thank you for taking time to come for your Medicare Wellness Visit. I appreciate your ongoing commitment to your health goals. Please review the following plan we discussed and let me know if I can assist you in the future.   Referrals/Orders/Follow-Ups/Clinician Recommendations: Bone density You have an order for:  []   2D Mammogram  []   3D Mammogram  [x]   Bone Density     Please call for appointment:  [facility]  [address] [place] [address] [phone]     Make sure to wear two-piece clothing.  No lotions, powders, or deodorants the day of the appointment. Make sure to bring picture ID and insurance card.  Bring list of medications you are currently taking including any supplements.   Schedule your [facility] screening mammogram through MyChart!   Log into your MyChart account.  Go to 'Visit' (or 'Appointments' if on mobile App) --> Schedule an Appointment  Under 'Select a Reason for Visit' choose the Mammogram Screening option.  Complete the pre-visit questions and select the time and place that best fits your schedule.    This is a list of the screening recommended for you and due dates:  Health Maintenance  Topic Date Due   COVID-19 Vaccine (4 - 2024-25 season) 05/07/2023   Mammogram  04/06/2024   Medicare Annual Wellness Visit  10/09/2024   DTaP/Tdap/Td vaccine (3 - Tdap) 10/07/2025   Flu Shot  Completed   DEXA scan (bone density measurement)  Completed   Hepatitis C Screening  Completed   Zoster (Shingles) Vaccine  Completed   HPV Vaccine  Aged Out   Pneumonia Vaccine  Discontinued   Colon Cancer Screening  Discontinued    Advanced directives: (Copy Requested) Please bring a copy of your health care power of attorney and living will to the office to be added to your chart at your convenience.  Next Medicare Annual Wellness Visit scheduled for next year: Yes 10/11/24 @ 10:50

## 2023-10-10 NOTE — Progress Notes (Signed)
 Subjective:   Lindsey Blair is a 78 y.o. female who presents for Medicare Annual (Subsequent) preventive examination.  Visit Complete: Virtual I connected with  Lindsey Blair on 10/10/23 by a audio enabled telemedicine application and verified that I am speaking with the correct person using two identifiers. This patient declined Interactive audio and acupuncturist. Therefore the visit was completed with audio only.   Patient Location: Home  Provider Location: Office/Clinic  I discussed the limitations of evaluation and management by telemedicine. The patient expressed understanding and agreed to proceed.  Vital Signs: Because this visit was a virtual/telehealth visit, some criteria may be missing or patient reported. Any vitals not documented were not able to be obtained and vitals that have been documented are patient reported.  Cardiac Risk Factors include: advanced age (>79men, >26 women);dyslipidemia;hypertension     Objective:    Today's Vitals   10/10/23 0814  Weight: 120 lb (54.4 kg)  Height: 5' 1.5 (1.562 m)   Body mass index is 22.31 kg/m.     10/10/2023    8:29 AM 10/17/2022    8:21 AM 08/11/2021    9:29 AM 05/20/2021    2:37 PM 08/10/2020    9:03 AM 11/01/2018    9:49 AM 02/17/2017    8:19 AM  Advanced Directives  Does Patient Have a Medical Advance Directive? Yes No Yes Yes Yes Yes No  Type of Estate Agent of [place];Living will  Healthcare Power of [place] of [place];Living will Healthcare Power of [place];Living will Healthcare Power of [place];Living will   Copy of Healthcare Power of Attorney in Chart? No - copy requested  Yes - validated most recent copy scanned in chart (See row information)  No - copy requested No - copy requested   Would patient like information on creating a medical advance directive?  No - Patient declined  No - Patient declined   Yes (MAU/Ambulatory/Procedural Areas - Information  given)    Current Medications (verified) Outpatient Encounter Medications as of 10/10/2023  Medication Sig   amLODipine -benazepril  (LOTREL) 10-20 MG capsule Take 1 capsule by mouth daily.   atorvastatin  (LIPITOR) 20 MG tablet Take 1 tablet (20 mg total) by mouth daily.   buPROPion  (WELLBUTRIN  SR) 200 MG 12 hr tablet Take 1 tablet (200 mg total) by mouth 2 (two) times daily.   Cholecalciferol (VITAMIN D3 GUMMIES PO) Take 2,000 mg by mouth daily.   estradiol  (ESTRACE  VAGINAL) 0.1 MG/GM vaginal cream Place 1 Applicatorful vaginally at bedtime. Place pea-sized amount vaginally at bedtime.   hydrochlorothiazide  (HYDRODIURIL ) 12.5 MG tablet Take 1 tablet (12.5 mg total) by mouth daily.   hydroxychloroquine (PLAQUENIL) 200 MG tablet Take 200 mg by mouth daily.   melatonin 3 MG TABS tablet Take 3 mg by mouth at bedtime as needed.   pantoprazole  (PROTONIX ) 40 MG tablet Take 1 tablet (40 mg total) by mouth daily.   propranolol  (INDERAL ) 20 MG tablet Take 1 tablet (20 mg total) by mouth 2 (two) times daily.   Testosterone  1.62 % GEL Place 1 application  onto the skin 3 (three) times a week. I am unable to make the computer do a 4% gel but please use.   tiZANidine  (ZANAFLEX ) 4 MG tablet Take 1 tablet (4 mg total) by mouth every 6 (six) hours as needed for muscle spasms.   traMADol  (ULTRAM ) 50 MG tablet Take 1 tablet (50 mg total) by mouth every 8 (eight) hours as needed. (Patient not taking: Reported on  10/10/2023)   triamcinolone  0.1%-silver sulfadiazine 1:1 cream mixture Apply topically. (Patient not taking: Reported on 10/10/2023)   [DISCONTINUED] pantoprazole  (PROTONIX ) 40 MG tablet Take 1 tablet (40 mg total) by mouth daily. (Patient not taking: Reported on 10/10/2023)   No facility-administered encounter medications on file as of 10/10/2023.    Allergies (verified) Penicillins   History: Past Medical History:  Diagnosis Date   Anxiety    Cancer (HCC) Several instances of the years   Skin   Chronic  kidney disease    Cutaneous lupus erythematosus    Depression    Hyperlipidemia    Hypertension    Vertigo    rare   Past Surgical History:  Procedure Laterality Date   BASAL CELL CARCINOMA EXCISION     BREAST BIOPSY Right 2000   Negative   CATARACT EXTRACTION Right 08/05/2019   COLONOSCOPY WITH PROPOFOL  N/A 10/22/2015   Procedure: COLONOSCOPY WITH PROPOFOL ;  Surgeon: Rogelia Copping, MD;  Location: [facility];  Service: Endoscopy;  Laterality: N/A;   ESOPHAGOGASTRODUODENOSCOPY (EGD) WITH PROPOFOL  N/A 10/22/2015   Procedure: ESOPHAGOGASTRODUODENOSCOPY (EGD) WITH PROPOFOL ;  Surgeon: Rogelia Copping, MD;  Location: [facility];  Service: Endoscopy;  Laterality: N/A;   IMAGE GUIDED SINUS SURGERY  04/06/11   [facility], Dr. Milissa   MASTOIDECTOMY     Family History  Problem Relation Age of Onset   Heart disease Mother    Leukemia Father    Stroke Father    Cerebral aneurysm Sister    Prostate cancer Brother    Prostate cancer Brother    Prostate cancer Brother    Lung cancer Sister    Bladder Cancer Sister    Liver cancer Sister    Brain cancer Sister    Heart attack Sister    Breast cancer Neg Hx    Social History   Socioeconomic History   Marital status: Married    Spouse name: Not on file   Number of children: Not on file   Years of education: Not on file   Highest education level: Some college, no degree  Occupational History   Occupation: retired   Tobacco Use   Smoking status: Former    Current packs/day: 0.00    Types: Cigarettes    Quit date: 03/08/1965    Years since quitting: 58.6   Smokeless tobacco: Never  Vaping Use   Vaping status: Never Used  Substance and Sexual Activity   Alcohol use: Yes    Alcohol/week: 5.0 standard drinks of alcohol    Types: 2 Glasses of wine, 3 Shots of liquor per week    Comment: daily   Drug use: No   Sexual activity: Yes  Other Topics Concern   Not on file  Social History Narrative   Married   Social Drivers of  Health   Financial Resource Strain: Low Risk  (10/10/2023)   Overall Financial Resource Strain (CARDIA)    Difficulty of Paying Living Expenses: Not hard at all  Food Insecurity: No Food Insecurity (10/10/2023)   Hunger Vital Sign    Worried About Running Out of Food in the Last Year: Never true    Ran Out of Food in the Last Year: Never true  Transportation Needs: No Transportation Needs (10/10/2023)   PRAPARE - Administrator, Civil Service (Medical): No    Lack of Transportation (Non-Medical): No  Physical Activity: Inactive (10/10/2023)   Exercise Vital Sign    Days of Exercise per Week: 0 days  Minutes of Exercise per Session: 0 min  Stress: No Stress Concern Present (10/10/2023)   [employer] of Occupational Health - Occupational Stress Questionnaire    Feeling of Stress : Not at all  Social Connections: Moderately Integrated (10/10/2023)   Social Connection and Isolation Panel [NHANES]    Frequency of Communication with Friends and Family: More than three times a week    Frequency of Social Gatherings with Friends and Family: More than three times a week    Attends Religious Services: More than 4 times per year    Active Member of [employer] or Organizations: No    Attends Engineer, Structural: Never    Marital Status: Married    Tobacco Counseling Counseling given: Not Answered   Clinical Intake:  Pre-visit preparation completed: Yes  Pain : No/denies pain     BMI - recorded: 22.31 Nutritional Risks: None Diabetes: No  How often do you need to have someone help you when you read instructions, pamphlets, or other written materials from your doctor or pharmacy?: 1 - Never  Interpreter Needed?: No  Information entered by :: R. Nayelis Bonito LPN   Activities of Daily Living    10/10/2023    8:15 AM 10/17/2022    8:22 AM  In your present state of health, do you have any difficulty performing the following activities:  Hearing? 0 0  Vision? 0 0   Difficulty concentrating or making decisions? 0 0  Walking or climbing stairs? 0 0  Dressing or bathing? 0 0  Doing errands, shopping? 0 0  Preparing Food and eating ? N N  Using the Toilet? N N  In the past six months, have you accidently leaked urine? N N  Do you have problems with loss of bowel control? N N  Managing your Medications? N N  Managing your Finances? N N  Housekeeping or managing your Housekeeping? N N    Patient Care Team: Gretel App, NP as PCP - General (Nurse Practitioner) Jinny Carmine, MD as Consulting Physician (Gastroenterology) Douglas Rule, MD as Consulting Physician (Internal Medicine) Maryl Zachary LELON Mickey., MD (Rheumatology)  Indicate any recent Medical Services you may have received from other than [facility] providers in the past year (date may be approximate).     Assessment:   This is a routine wellness examination for Lindsey Blair.  Hearing/Vision screen Hearing Screening - Comments:: No issues Vision Screening - Comments:: readers   Goals Addressed             This Visit's Progress    Patient Stated       Wants to set a regular exercise program       Depression Screen    10/10/2023    8:23 AM 08/01/2023    2:17 PM 01/18/2023   10:00 AM 10/17/2022    8:20 AM 07/20/2022    9:58 AM 05/17/2022   11:00 AM 04/12/2022   10:26 AM  PHQ 2/9 Scores  PHQ - 2 Score 0 1 0 0 0 0 0  PHQ- 9 Score 3 3 1  0 0 2 1    Fall Risk    10/10/2023    8:17 AM 08/01/2023    2:17 PM 01/18/2023   10:00 AM 10/17/2022    8:22 AM 07/20/2022   10:24 AM  Fall Risk   Falls in the past year? 0 0 0 0 0  Number falls in past yr: 0 0 0 0 0  Injury with Fall? 0 0 0 0 0  Risk for fall due to : No Fall Risks No Fall Risks No Fall Risks No Fall Risks No Fall Risks  Follow up Falls prevention discussed;Falls evaluation completed Falls evaluation completed Falls evaluation completed Falls prevention discussed;Falls evaluation completed Falls prevention discussed    MEDICARE  RISK AT HOME: Medicare Risk at Home Any stairs in or around the home?: Yes If so, are there any without handrails?: No Home free of loose throw rugs in walkways, pet beds, electrical cords, etc?: No (discussed to get rubber backing or do away with) Adequate lighting in your home to reduce risk of falls?: Yes Life alert?: No Use of a cane, walker or w/c?: No Grab bars in the bathroom?: No Shower chair or bench in shower?: No Elevated toilet seat or a handicapped toilet?: Yes   Cognitive Function:        10/10/2023    8:29 AM 10/17/2022    8:23 AM 08/10/2020    9:07 AM 02/17/2017    8:23 AM  6CIT Screen  What Year? 0 points 0 points 0 points 0 points  What month? 0 points 0 points 0 points 0 points  What time? 0 points 0 points 0 points 0 points  Count back from 20 0 points 0 points 2 points 0 points  Months in reverse 0 points 0 points 0 points 0 points  Repeat phrase 0 points 0 points 2 points 0 points  Total Score 0 points 0 points 4 points 0 points    Immunizations Immunization History  Administered Date(s) Administered   Fluad Quad(high Dose 65+) 09/14/2021   Fluad Trivalent(High Dose 65+) 08/01/2023   Influenza, High Dose Seasonal PF 05/14/2018, 06/22/2022   Influenza,inj,quad, With Preservative 06/17/2019   Influenza-Unspecified 06/11/2015, 06/24/2016, 06/08/2017, 06/11/2020, 06/20/2022   Moderna Sars-Covid-2 Vaccination 10/22/2019, 11/19/2019, 06/11/2020   Pneumococcal Conjugate-13 04/09/2014   Pneumococcal-Unspecified 10/03/2011   Td 08/25/2005, 10/08/2015   Zoster Recombinant(Shingrix) 11/08/2018, 02/25/2019   Zoster, Live 08/27/2008    TDAP status: Up to date  Flu Vaccine status: Up to date  Pneumococcal vaccine status: Up to date  Covid-19 vaccine status: Information provided on how to obtain vaccines.   Qualifies for Shingles Vaccine? Yes   Zostavax completed Yes   Shingrix Completed?: Yes  Screening Tests Health Maintenance  Topic Date Due    COVID-19 Vaccine (4 - 2024-25 season) 05/07/2023   Medicare Annual Wellness (AWV)  10/18/2023   DTaP/Tdap/Td (3 - Tdap) 10/07/2025   INFLUENZA VACCINE  Completed   DEXA SCAN  Completed   Hepatitis C Screening  Completed   Zoster Vaccines- Shingrix  Completed   HPV VACCINES  Aged Out   Pneumonia Vaccine 36+ Years old  Discontinued   Colonoscopy  Discontinued    Health Maintenance  Health Maintenance Due  Topic Date Due   COVID-19 Vaccine (4 - 2024-25 season) 05/07/2023   Medicare Annual Wellness (AWV)  10/18/2023    Colorectal cancer screening: No longer required.   Mammogram status: Completed 04/2023. Repeat every year  Bone Density status: Ordered 10/10/23. Pt provided with contact info and advised to call to schedule appt.  Lung Cancer Screening: (Low Dose CT Chest recommended if Age 12-80 years, 20 pack-year currently smoking OR have quit w/in 15years.) does not qualify.    Additional Screening:  Hepatitis C Screening: does qualify; Completed 11/2015  Vision Screening: Recommended annual ophthalmology exams for early detection of glaucoma and other disorders of the eye. Is the patient up to date with their annual eye exam?  Yes  Who is the provider or what is the name of the office in which the patient attends annual eye exams? [facility] If pt is not established with a provider, would they like to be referred to a provider to establish care? No .   Dental Screening: Recommended annual dental exams for proper oral hygiene  Community Resource Referral / Chronic Care Management: CRR required this visit?  No   CCM required this visit?  No     Plan:     I have personally reviewed and noted the following in the patient's chart:   Medical and social history Use of alcohol, tobacco or illicit drugs  Current medications and supplements including opioid prescriptions. Patient is not currently taking opioid prescriptions. Functional ability and status Nutritional  status Physical activity Advanced directives List of other physicians Hospitalizations, surgeries, and ER visits in previous 12 months Vitals Screenings to include cognitive, depression, and falls Referrals and appointments  In addition, I have reviewed and discussed with patient certain preventive protocols, quality metrics, and best practice recommendations. A written personalized care plan for preventive services as well as general preventive health recommendations were provided to patient.     Angeline Fredericks, LPN   03/09/7973   After Visit Summary: (Mail) Due to this being a telephonic visit, the after visit summary with patients personalized plan was offered to patient via mail   Nurse Notes: None

## 2023-10-17 ENCOUNTER — Ambulatory Visit
Admission: RE | Admit: 2023-10-17 | Discharge: 2023-10-17 | Disposition: A | Discharge status: Home / Self Care | Payer: HMO | Source: Ambulatory Visit | Attending: Nurse Practitioner | Admitting: Nurse Practitioner

## 2023-10-17 DIAGNOSIS — Z780 Asymptomatic menopausal state: Secondary | ICD-10-CM | POA: Diagnosis not present

## 2023-10-18 ENCOUNTER — Telehealth: Payer: Self-pay

## 2023-10-18 NOTE — Telephone Encounter (Signed)
Copied from CRM [id number]. Topic: Clinical - Lab/Test Results >> Oct 18, 2023  8:51 AM Elizebeth Brooking wrote: Reason for CRM: Patient called in regarding callback from nurse, would like a more in dept explanation of results

## 2023-10-18 NOTE — Telephone Encounter (Signed)
Called home phone left vm to cb in regards to dexa bone density scan    (Okay to read results, please send a message back once completed)

## 2023-10-18 NOTE — Telephone Encounter (Signed)
Called pt to go over scan she stated she did not ask for an in depth explanation and she was good with the information.

## 2023-12-01 ENCOUNTER — Other Ambulatory Visit: Payer: Self-pay | Admitting: Nurse Practitioner

## 2023-12-01 DIAGNOSIS — I10 Essential (primary) hypertension: Secondary | ICD-10-CM

## 2023-12-01 NOTE — Telephone Encounter (Signed)
 Copied from CRM [id number]. Topic: Clinical - Medication Refill >> Dec 01, 2023  1:53 PM Gurney Maxin H wrote: Most Recent Primary Care Visit:  Provider: Sydell Axon C  Department: [facility]  Visit Type: MEDICARE AWV, SEQUENTIAL  Date: 10/10/2023  Medication: tiZANidine (ZANAFLEX) 4 MG tablet  Has the patient contacted their pharmacy? No, no refills on bottle (Agent: If no, request that the patient contact the pharmacy for the refill. If patient does not wish to contact the pharmacy document the reason why and proceed with request.) (Agent: If yes, when and what did the pharmacy advise?)  Is this the correct pharmacy for this prescription? Yes If no, delete pharmacy and type the correct one.  This is the patient's preferred pharmacy:  [facility] - [place], [place] - [address] [place] [address] Phone: [phone] Fax: [phone]   Has the prescription been filled recently? No  Is the patient out of the medication? No  Has the patient been seen for an appointment in the last year OR does the patient have an upcoming appointment? Yes  Can we respond through MyChart? No  Agent: Please be advised that Rx refills may take up to 3 business days. We ask that you follow-up with your pharmacy.

## 2023-12-05 MED ORDER — TIZANIDINE HCL 4 MG PO TABS: 4.0000 mg | ORAL_TABLET | Freq: Three times a day (TID) | ORAL | 4 refills | Status: DC | PRN

## 2023-12-19 DIAGNOSIS — L570 Actinic keratosis: Secondary | ICD-10-CM | POA: Diagnosis not present

## 2023-12-19 DIAGNOSIS — L931 Subacute cutaneous lupus erythematosus: Secondary | ICD-10-CM | POA: Diagnosis not present

## 2023-12-19 DIAGNOSIS — L821 Other seborrheic keratosis: Secondary | ICD-10-CM | POA: Diagnosis not present

## 2023-12-19 DIAGNOSIS — D2271 Melanocytic nevi of right lower limb, including hip: Secondary | ICD-10-CM | POA: Diagnosis not present

## 2023-12-19 DIAGNOSIS — D2272 Melanocytic nevi of left lower limb, including hip: Secondary | ICD-10-CM | POA: Diagnosis not present

## 2023-12-19 DIAGNOSIS — D2261 Melanocytic nevi of right upper limb, including shoulder: Secondary | ICD-10-CM | POA: Diagnosis not present

## 2023-12-19 DIAGNOSIS — M71342 Other bursal cyst, left hand: Secondary | ICD-10-CM | POA: Diagnosis not present

## 2023-12-19 DIAGNOSIS — D2262 Melanocytic nevi of left upper limb, including shoulder: Secondary | ICD-10-CM | POA: Diagnosis not present

## 2023-12-19 DIAGNOSIS — D225 Melanocytic nevi of trunk: Secondary | ICD-10-CM | POA: Diagnosis not present

## 2024-01-04 DIAGNOSIS — M7989 Other specified soft tissue disorders: Secondary | ICD-10-CM

## 2024-01-04 HISTORY — DX: Other specified soft tissue disorders: M79.89

## 2024-01-22 DIAGNOSIS — M25542 Pain in joints of left hand: Secondary | ICD-10-CM | POA: Diagnosis not present

## 2024-01-22 DIAGNOSIS — D2360 Other benign neoplasm of skin of unspecified upper limb, including shoulder: Secondary | ICD-10-CM | POA: Diagnosis not present

## 2024-01-30 ENCOUNTER — Ambulatory Visit: Payer: Self-pay | Admitting: Nurse Practitioner

## 2024-01-30 ENCOUNTER — Other Ambulatory Visit: Payer: Self-pay | Admitting: Nurse Practitioner

## 2024-01-30 ENCOUNTER — Ambulatory Visit (INDEPENDENT_AMBULATORY_CARE_PROVIDER_SITE_OTHER): Payer: PPO | Admitting: Nurse Practitioner

## 2024-01-30 ENCOUNTER — Encounter: Payer: Self-pay | Admitting: Nurse Practitioner

## 2024-01-30 VITALS — BP 118/70 | HR 58 | Temp 97.8°F | Ht 61.5 in | Wt 125.0 lb

## 2024-01-30 DIAGNOSIS — E7800 Pure hypercholesterolemia, unspecified: Secondary | ICD-10-CM | POA: Diagnosis not present

## 2024-01-30 DIAGNOSIS — G8929 Other chronic pain: Secondary | ICD-10-CM | POA: Diagnosis not present

## 2024-01-30 DIAGNOSIS — F3342 Major depressive disorder, recurrent, in full remission: Secondary | ICD-10-CM | POA: Diagnosis not present

## 2024-01-30 DIAGNOSIS — I10 Essential (primary) hypertension: Secondary | ICD-10-CM

## 2024-01-30 DIAGNOSIS — N1831 Chronic kidney disease, stage 3a: Secondary | ICD-10-CM

## 2024-01-30 DIAGNOSIS — G250 Essential tremor: Secondary | ICD-10-CM | POA: Diagnosis not present

## 2024-01-30 DIAGNOSIS — M25552 Pain in left hip: Secondary | ICD-10-CM | POA: Diagnosis not present

## 2024-01-30 DIAGNOSIS — M85851 Other specified disorders of bone density and structure, right thigh: Secondary | ICD-10-CM | POA: Diagnosis not present

## 2024-01-30 DIAGNOSIS — G4700 Insomnia, unspecified: Secondary | ICD-10-CM

## 2024-01-30 DIAGNOSIS — M25551 Pain in right hip: Secondary | ICD-10-CM

## 2024-01-30 DIAGNOSIS — M329 Systemic lupus erythematosus, unspecified: Secondary | ICD-10-CM | POA: Diagnosis not present

## 2024-01-30 DIAGNOSIS — M85852 Other specified disorders of bone density and structure, left thigh: Secondary | ICD-10-CM

## 2024-01-30 DIAGNOSIS — N951 Menopausal and female climacteric states: Secondary | ICD-10-CM

## 2024-01-30 LAB — COMPREHENSIVE METABOLIC PANEL WITH GFR
ALT: 20 U/L (ref 0–35)
AST: 23 U/L (ref 0–37)
Albumin: 4.4 g/dL (ref 3.5–5.2)
Alkaline Phosphatase: 56 U/L (ref 39–117)
BUN: 15 mg/dL (ref 6–23)
CO2: 29 meq/L (ref 19–32)
Calcium: 9.9 mg/dL (ref 8.4–10.5)
Chloride: 101 meq/L (ref 96–112)
Creatinine, Ser: 1.17 mg/dL (ref 0.40–1.20)
GFR: 44.56 mL/min — ABNORMAL LOW (ref >60.00)
Glucose, Bld: 86 mg/dL (ref 70–99)
Potassium: 4.5 meq/L (ref 3.5–5.1)
Sodium: 137 meq/L (ref 135–145)
Total Bilirubin: 0.6 mg/dL (ref 0.2–1.2)
Total Protein: 6.7 g/dL (ref 6.0–8.3)

## 2024-01-30 LAB — LIPID PANEL
Cholesterol: 176 mg/dL (ref 0–200)
HDL: 71.5 mg/dL (ref >39.00)
LDL Cholesterol: 86 mg/dL (ref 0–99)
NonHDL: 104.71
Total CHOL/HDL Ratio: 2
Triglycerides: 93 mg/dL (ref 0.0–149.0)
VLDL: 18.6 mg/dL (ref 0.0–40.0)

## 2024-01-30 LAB — VITAMIN D 25 HYDROXY (VIT D DEFICIENCY, FRACTURES): VITD: 51.36 ng/mL (ref 30.00–100.00)

## 2024-01-30 LAB — VITAMIN B12: Vitamin B-12: 622 pg/mL (ref 211–911)

## 2024-01-30 MED ORDER — TRIAMCINOLONE ACETONIDE 0.1 % EX CREA: TOPICAL_CREAM | Freq: Every day | CUTANEOUS | 0 refills | Status: DC | PRN

## 2024-01-30 NOTE — Assessment & Plan Note (Signed)
 Tremor has improved with propranolol , and no side effects are reported. Continue propranolol  as needed.

## 2024-01-30 NOTE — Assessment & Plan Note (Signed)
 Mood remains stable with Wellbutrin  SR 200 mg twice daily, and there are no thoughts of self-harm. Continue Wellbutrin .

## 2024-01-30 NOTE — Assessment & Plan Note (Signed)
 Cholesterol is managed with Lipitor 20 mg daily with no side effects reported. Continue lipitor. Check lipid panel today.

## 2024-01-30 NOTE — Progress Notes (Signed)
 Lindsey Burkitt, NP-C Phone: [phone]  Lindsey Blair is a 78 y.o. female who presents today for follow up.   Discussed the use of AI scribe software for clinical note transcription with the patient, who gave verbal consent to proceed.  History of Present Illness   Lindsey Blair "Benigno Brakeman" is a 78 year old female with hypertension and hyperlipidemia who presents for a routine follow-up visit.  Her blood pressure is usually well-controlled at home, with a recent reading of 116/65 mmHg. She is currently on amlodipine  with benazepril  and hydrochlorothiazide  but did not take her medication on the morning of the visit. She experiences nocturnal leg swelling that resolves by morning. No dizziness, chest pain, or shortness of breath.  She is on Lipitor for hyperlipidemia and has no muscle aches or abdominal pain associated with its use.  Her mood is stable, and she notices a positive difference since the adjustment of her Wellbutrin . No thoughts of self-harm or harm to others.  She takes propranolol  for tremors, usually once a day when she anticipates needing to hold a piece of paper in public. She notices a difference with its use and denies any associated fatigue.  She previously trialed tramadol  for hip pain, but found it no more effective than Tylenol . Her hip pain has since improved, and she no longer requires tramadol .  She is under the care of a nephrologist, with her next appointment scheduled for July. She also sees a dermatologist every six months, with her last visit occurring recently. She continues to take Plaquenil as prescribed for Lupus.      Social History   Tobacco Use  Smoking Status Former   Current packs/day: 0.00   Types: Cigarettes   Quit date: 03/08/1965   Years since quitting: 58.9  Smokeless Tobacco Never    Current Outpatient Medications on File Prior to Visit  Medication Sig Dispense Refill   amLODipine -benazepril  (LOTREL) 10-20 MG capsule Take 1 capsule by  mouth daily. 90 capsule 3   atorvastatin  (LIPITOR) 20 MG tablet Take 1 tablet (20 mg total) by mouth daily. 90 tablet 4   buPROPion  (WELLBUTRIN  SR) 200 MG 12 hr tablet Take 1 tablet (200 mg total) by mouth 2 (two) times daily. 180 tablet 3   Cholecalciferol (VITAMIN D3 GUMMIES PO) Take 2,000 mg by mouth daily.     estradiol  (ESTRACE  VAGINAL) 0.1 MG/GM vaginal cream Place 1 Applicatorful vaginally at bedtime. Place pea-sized amount vaginally at bedtime. 42.5 g 1   hydrochlorothiazide  (HYDRODIURIL ) 12.5 MG tablet Take 1 tablet (12.5 mg total) by mouth daily. 90 tablet 3   hydroxychloroquine (PLAQUENIL) 200 MG tablet Take 200 mg by mouth daily.     melatonin 3 MG TABS tablet Take 3 mg by mouth at bedtime as needed.     pantoprazole  (PROTONIX ) 40 MG tablet Take 1 tablet (40 mg total) by mouth daily. 90 tablet 0   propranolol  (INDERAL ) 20 MG tablet Take 1 tablet (20 mg total) by mouth 2 (two) times daily. 180 tablet 3   Testosterone  1.62 % GEL Place 1 application  onto the skin 3 (three) times a week. I am unable to make the computer do a 4% gel but please use. 75 g 1   tiZANidine  (ZANAFLEX ) 4 MG tablet Take 1 tablet (4 mg total) by mouth every 8 (eight) hours as needed for muscle spasms. 30 tablet 4   No current facility-administered medications on file prior to visit.     ROS see history of present illness  Objective  Physical Exam Vitals:   01/30/24 0833 01/30/24 1621  BP: (!) 140/82 118/70  Pulse: (!) 58   Temp: 97.8 F (36.6 C)   SpO2: 98%     BP Readings from Last 3 Encounters:  01/30/24 118/70  08/01/23 122/68  01/18/23 122/70   Wt Readings from Last 3 Encounters:  01/30/24 125 lb (56.7 kg)  10/10/23 120 lb (54.4 kg)  08/01/23 127 lb 9.6 oz (57.9 kg)    Physical Exam Constitutional:      General: She is not in acute distress.    Appearance: Normal appearance.  HENT:     Head: Normocephalic.  Cardiovascular:     Rate and Rhythm: Normal rate and regular rhythm.      Heart sounds: Normal heart sounds.  Pulmonary:     Effort: Pulmonary effort is normal.     Breath sounds: Normal breath sounds.  Skin:    General: Skin is warm and dry.  Neurological:     General: No focal deficit present.     Mental Status: She is alert.  Psychiatric:        Mood and Affect: Mood normal.        Behavior: Behavior normal.      Assessment/Plan: Please see individual problem list.  Primary hypertension Assessment & Plan: Blood pressure is elevated today but typically normal at home and well controlled previously. Improvement noted on second reading. She missed her antihypertensive dose this morning and reports no symptoms. Continue amlodipine -benazepril  10-20 mg daily, and hydrochlorothiazide  12.5 mg daily.    Recurrent major depressive disorder, in full remission [facility]) Assessment & Plan: Mood remains stable with Wellbutrin  SR 200 mg twice daily, and there are no thoughts of self-harm. Continue Wellbutrin .    Chronic hip pain, bilateral Assessment & Plan: Hip pain has improved, and tramadol  is no longer needed. Discontinue tramadol , continue tylenol  as needed.    Benign essential tremor Assessment & Plan: Tremor has improved with propranolol , and no side effects are reported. Continue propranolol  as needed.   Systemic lupus erythematosus, unspecified SLE type, unspecified organ involvement status [facility]) Assessment & Plan: Managed by [facility] with hydroxychloroquine. Stable at this time. We will continue the current management and follow-up with Dermatology as scheduled.   Orders: -     triamcinolone  0.1%-silver sulfadiazine 1:1 cream mixture; Apply topically daily as needed.  Dispense: 60 g; Refill: 0 -     Vitamin B12  Pure hypercholesterolemia Assessment & Plan: Cholesterol is managed with Lipitor 20 mg daily with no side effects reported. Continue lipitor. Check lipid panel today.   Orders: -     Lipid panel  Stage 3a chronic kidney disease  [facility]) Assessment & Plan: Follow up with Nephrology as scheduled in July. Check CMP today.   Orders: -     Comprehensive metabolic panel with GFR  Osteopenia of necks of both femurs -     VITAMIN D  25 Hydroxy (Vit-D Deficiency, Fractures)     Return in about 6 months (around 08/01/2024) for Follow up.   Lindsey Burkitt, NP-C [facility]

## 2024-01-30 NOTE — Assessment & Plan Note (Signed)
 Follow up with Nephrology as scheduled in July. Check CMP today.

## 2024-01-30 NOTE — Assessment & Plan Note (Signed)
 Managed by [facility] with hydroxychloroquine. Stable at this time. We will continue the current management and follow-up with Dermatology as scheduled.

## 2024-01-30 NOTE — Assessment & Plan Note (Addendum)
 Blood pressure is elevated today but typically normal at home and well controlled previously. Improvement noted on second reading. She missed her antihypertensive dose this morning and reports no symptoms. Continue amlodipine -benazepril  10-20 mg daily, and hydrochlorothiazide  12.5 mg daily.

## 2024-01-30 NOTE — Assessment & Plan Note (Signed)
 Hip pain has improved, and tramadol  is no longer needed. Discontinue tramadol , continue tylenol  as needed.

## 2024-02-05 NOTE — Telephone Encounter (Signed)
 Detailed vm left asking pt to cb and let us  know if she is still using cream and need refill

## 2024-02-05 NOTE — Telephone Encounter (Signed)
 Copied from CRM [id number]. Topic: General - Other >> Feb 05, 2024 11:31 AM Martinique E wrote: Reason for CRM: Patient called back and wanted to let her care team know that she is still using her Estradiol  cream.

## 2024-02-06 NOTE — Telephone Encounter (Signed)
 Copied from CRM [id number]. Topic: Clinical - Medication Question >> Feb 06, 2024 11:45 AM Caliyah H wrote: Reason for CRM: Patient called requesting for Atorvastatin  to be sent in.  [facility] - [place], [place] - [address] [place] [address] Phone: [phone] Fax: [phone] Hours: Not open 24 hours

## 2024-02-07 ENCOUNTER — Other Ambulatory Visit: Payer: Self-pay | Admitting: Surgery

## 2024-02-14 ENCOUNTER — Other Ambulatory Visit: Payer: Self-pay | Admitting: Nurse Practitioner

## 2024-02-14 ENCOUNTER — Encounter
Admission: RE | Admit: 2024-02-14 | Discharge: 2024-02-14 | Disposition: A | Discharge status: Home / Self Care | Source: Ambulatory Visit | Attending: Surgery | Admitting: Surgery

## 2024-02-14 ENCOUNTER — Other Ambulatory Visit: Payer: Self-pay

## 2024-02-14 VITALS — Ht 61.5 in | Wt 119.0 lb

## 2024-02-14 DIAGNOSIS — I10 Essential (primary) hypertension: Secondary | ICD-10-CM

## 2024-02-14 DIAGNOSIS — N1831 Chronic kidney disease, stage 3a: Secondary | ICD-10-CM

## 2024-02-14 DIAGNOSIS — Z01810 Encounter for preprocedural cardiovascular examination: Secondary | ICD-10-CM

## 2024-02-14 DIAGNOSIS — E7800 Pure hypercholesterolemia, unspecified: Secondary | ICD-10-CM

## 2024-02-14 DIAGNOSIS — Z01812 Encounter for preprocedural laboratory examination: Secondary | ICD-10-CM

## 2024-02-14 HISTORY — DX: Other specified disorders of bone density and structure, unspecified site: M85.80

## 2024-02-14 HISTORY — DX: Gastro-esophageal reflux disease with esophagitis, without bleeding: K21.00

## 2024-02-14 HISTORY — DX: Systemic lupus erythematosus, unspecified: M32.9

## 2024-02-14 HISTORY — DX: Chronic kidney disease, stage 3a: N18.31

## 2024-02-14 HISTORY — DX: Essential tremor: G25.0

## 2024-02-14 HISTORY — DX: Cardiac murmur, unspecified: R01.1

## 2024-02-14 NOTE — Patient Instructions (Addendum)
 Your procedure is scheduled on: Wednesday, June 18 Report to the Registration Desk on the 1st floor of the [facility] To find out your arrival time, please call [phone] between 1PM - 3PM on: Tuesday, June 17 If your arrival time is 6:00 am, do not arrive before that time as the [facility] entrance doors do not open until 6:00 am.  REMEMBER: Instructions that are not followed completely may result in serious medical risk, up to and including death; or upon the discretion of your surgeon and anesthesiologist your surgery may need to be rescheduled.  Do not eat food after midnight the night before surgery.  No gum chewing or hard candies.  You may however, drink CLEAR liquids up to 2 hours before you are scheduled to arrive for your surgery. Do not drink anything within 2 hours of your scheduled arrival time.  Clear liquids include: - water   - apple juice without pulp - gatorade (not RED colors) - black coffee or tea (Do NOT add milk or creamers to the coffee or tea) Do NOT drink anything that is not on this list.  In addition, your doctor has ordered for you to drink the provided:  Ensure Pre-Surgery Clear Carbohydrate Drink  Drinking this carbohydrate drink up to two hours before surgery helps to reduce insulin resistance and improve patient outcomes. Please complete drinking 2 hours before scheduled arrival time.  One week prior to surgery: starting June 11 Stop Anti-inflammatories (NSAIDS) such as Advil, Aleve, Ibuprofen, Motrin, Naproxen, Naprosyn and Aspirin based products such as Excedrin, Goody's Powder, BC Powder. Stop ANY OVER THE COUNTER supplements until after surgery. Stop calcium , vitamin D , vitamin B12, melatonin, meloxicam, multiple vitamin.  You may however, continue to take Tylenol  if needed for pain up until the day of surgery.  Continue taking all of your other prescription medications up until the day of surgery.  ON THE DAY OF SURGERY ONLY TAKE THESE  MEDICATIONS WITH SIPS OF WATER :  Atorvastatin  (Lipitor) Bupropion  (Wellbutrin ) Pantoprazole  (Protonix )  No Alcohol for 24 hours before or after surgery.  No Smoking including e-cigarettes for 24 hours before surgery.  No chewable tobacco products for at least 6 hours before surgery.  No nicotine patches on the day of surgery.  Do not use any recreational drugs for at least a week (preferably 2 weeks) before your surgery.  Please be advised that the combination of cocaine and anesthesia may have negative outcomes, up to and including death. If you test positive for cocaine, your surgery will be cancelled.  On the morning of surgery brush your teeth with toothpaste and water , you may rinse your mouth with mouthwash if you wish. Do not swallow any toothpaste or mouthwash.  Use CHG Soap as directed on instruction sheet.  Do not wear jewelry, make-up, hairpins, clips or nail polish.  For welded (permanent) jewelry: bracelets, anklets, waist bands, etc.  Please have this removed prior to surgery.  If it is not removed, there is a chance that hospital personnel will need to cut it off on the day of surgery.  Do not wear lotions, powders, or perfumes.   Do not shave body hair from the neck down 48 hours before surgery.  Contact lenses, hearing aids and dentures may not be worn into surgery.  Do not bring valuables to the hospital. [facility] is not responsible for any missing/lost belongings or valuables.  Notify your doctor if there is any change in your medical condition (cold, fever, infection).  Wear  comfortable clothing (specific to your surgery type) to the hospital.  After surgery, you can help prevent lung complications by doing breathing exercises.  Take deep breaths and cough every 1-2 hours. Your doctor may order a device called an Incentive Spirometer to help you take deep breaths.  If you are being discharged the day of surgery, you will not be allowed to drive  home. You will need a responsible individual to drive you home and stay with you for 24 hours after surgery.   If you are taking public transportation, you will need to have a responsible individual with you.  Please call the Pre-admissions Testing Dept. at [phone] if you have any questions about these instructions.  Surgery Visitation Policy:  Patients having surgery or a procedure may have two visitors.  Children under the age of 93 must have an adult with them who is not the patient.      Preparing for Surgery with CHLORHEXIDINE GLUCONATE (CHG) Soap  Chlorhexidine Gluconate (CHG) Soap  o An antiseptic cleaner that kills germs and bonds with the skin to continue killing germs even after washing  o Used for showering the night before surgery and morning of surgery  Before surgery, you can play an important role by reducing the number of germs on your skin.  CHG (Chlorhexidine gluconate) soap is an antiseptic cleanser which kills germs and bonds with the skin to continue killing germs even after washing.  Please do not use if you have an allergy to CHG or antibacterial soaps. If your skin becomes reddened/irritated stop using the CHG.  1. Shower the NIGHT BEFORE SURGERY and the MORNING OF SURGERY with CHG soap.  2. If you choose to wash your hair, wash your hair first as usual with your normal shampoo.  3. After shampooing, rinse your hair and body thoroughly to remove the shampoo.  4. Use CHG as you would any other liquid soap. You can apply CHG directly to the skin and wash gently with a scrungie or a clean washcloth.  5. Apply the CHG soap to your body only from the neck down. Do not use on open wounds or open sores. Avoid contact with your eyes, ears, mouth, and genitals (private parts). Wash face and genitals (private parts) with your normal soap.  6. Wash thoroughly, paying special attention to the area where your surgery will be performed.  7. Thoroughly rinse  your body with warm water .  8. Do not shower/wash with your normal soap after using and rinsing off the CHG soap.  9. Pat yourself dry with a clean towel.  10. Wear clean pajamas to bed the night before surgery.  12. Place clean sheets on your bed the night of your first shower and do not sleep with pets.  13. Shower again with the CHG soap on the day of surgery prior to arriving at the hospital.  14. Do not apply any deodorants/lotions/powders.  15. Please wear clean clothes to the hospital.

## 2024-02-15 ENCOUNTER — Encounter
Admission: RE | Admit: 2024-02-15 | Discharge: 2024-02-15 | Disposition: A | Discharge status: Home / Self Care | Source: Ambulatory Visit | Attending: Surgery | Admitting: Surgery

## 2024-02-15 DIAGNOSIS — N1831 Chronic kidney disease, stage 3a: Secondary | ICD-10-CM | POA: Diagnosis not present

## 2024-02-15 DIAGNOSIS — R001 Bradycardia, unspecified: Secondary | ICD-10-CM | POA: Insufficient documentation

## 2024-02-15 DIAGNOSIS — Z01810 Encounter for preprocedural cardiovascular examination: Secondary | ICD-10-CM | POA: Diagnosis not present

## 2024-02-15 DIAGNOSIS — I491 Atrial premature depolarization: Secondary | ICD-10-CM | POA: Insufficient documentation

## 2024-02-15 DIAGNOSIS — R008 Other abnormalities of heart beat: Secondary | ICD-10-CM | POA: Diagnosis not present

## 2024-02-15 DIAGNOSIS — I129 Hypertensive chronic kidney disease with stage 1 through stage 4 chronic kidney disease, or unspecified chronic kidney disease: Secondary | ICD-10-CM | POA: Insufficient documentation

## 2024-02-15 DIAGNOSIS — Z01812 Encounter for preprocedural laboratory examination: Secondary | ICD-10-CM

## 2024-02-15 DIAGNOSIS — I10 Essential (primary) hypertension: Secondary | ICD-10-CM

## 2024-02-15 DIAGNOSIS — Z01818 Encounter for other preprocedural examination: Secondary | ICD-10-CM | POA: Insufficient documentation

## 2024-02-15 LAB — CBC
HCT: 36.4 % (ref 36.0–46.0)
Hemoglobin: 11.4 g/dL — ABNORMAL LOW (ref 12.0–15.0)
MCH: 25.6 pg — ABNORMAL LOW (ref 26.0–34.0)
MCHC: 31.3 g/dL (ref 30.0–36.0)
MCV: 81.6 fL (ref 80.0–100.0)
Platelets: 192 10*3/uL (ref 150–400)
RBC: 4.46 MIL/uL (ref 3.87–5.11)
RDW: 14.2 % (ref 11.5–15.5)
WBC: 4.3 10*3/uL (ref 4.0–10.5)
nRBC: 0 % (ref 0.0–0.2)

## 2024-02-21 ENCOUNTER — Ambulatory Visit: Payer: Self-pay | Admitting: Urgent Care

## 2024-02-21 ENCOUNTER — Ambulatory Visit
Admission: RE | Admit: 2024-02-21 | Discharge: 2024-02-21 | Disposition: A | Discharge status: Home / Self Care | Attending: Surgery | Admitting: Surgery

## 2024-02-21 ENCOUNTER — Other Ambulatory Visit: Payer: Self-pay

## 2024-02-21 ENCOUNTER — Ambulatory Visit: Admitting: Anesthesiology

## 2024-02-21 ENCOUNTER — Encounter
Admission: RE | Disposition: A | Discharge status: Home / Self Care | Payer: Self-pay | Source: Home / Self Care | Attending: Surgery

## 2024-02-21 ENCOUNTER — Encounter: Payer: Self-pay | Admitting: Surgery

## 2024-02-21 DIAGNOSIS — N189 Chronic kidney disease, unspecified: Secondary | ICD-10-CM | POA: Insufficient documentation

## 2024-02-21 DIAGNOSIS — I129 Hypertensive chronic kidney disease with stage 1 through stage 4 chronic kidney disease, or unspecified chronic kidney disease: Secondary | ICD-10-CM | POA: Diagnosis not present

## 2024-02-21 DIAGNOSIS — D2360 Other benign neoplasm of skin of unspecified upper limb, including shoulder: Secondary | ICD-10-CM | POA: Diagnosis not present

## 2024-02-21 DIAGNOSIS — G4700 Insomnia, unspecified: Secondary | ICD-10-CM

## 2024-02-21 DIAGNOSIS — I10 Essential (primary) hypertension: Secondary | ICD-10-CM

## 2024-02-21 DIAGNOSIS — N951 Menopausal and female climacteric states: Secondary | ICD-10-CM

## 2024-02-21 DIAGNOSIS — G250 Essential tremor: Secondary | ICD-10-CM

## 2024-02-21 DIAGNOSIS — F32A Depression, unspecified: Secondary | ICD-10-CM | POA: Insufficient documentation

## 2024-02-21 DIAGNOSIS — M67442 Ganglion, left hand: Secondary | ICD-10-CM | POA: Diagnosis not present

## 2024-02-21 DIAGNOSIS — D2362 Other benign neoplasm of skin of left upper limb, including shoulder: Secondary | ICD-10-CM | POA: Diagnosis not present

## 2024-02-21 DIAGNOSIS — F419 Anxiety disorder, unspecified: Secondary | ICD-10-CM | POA: Insufficient documentation

## 2024-02-21 DIAGNOSIS — R2232 Localized swelling, mass and lump, left upper limb: Secondary | ICD-10-CM | POA: Diagnosis not present

## 2024-02-21 DIAGNOSIS — M71342 Other bursal cyst, left hand: Secondary | ICD-10-CM | POA: Diagnosis not present

## 2024-02-21 HISTORY — PX: EXCISION MASS UPPER EXTREMETIES: SHX6704

## 2024-02-21 SURGERY — EXCISION MASS UPPER EXTREMITIES
Anesthesia: General | Site: Middle Finger | Laterality: Left

## 2024-02-21 MED ORDER — FENTANYL CITRATE (PF) 100 MCG/2ML IJ SOLN
INTRAMUSCULAR | Status: AC
Start: 2024-02-21 — End: 2024-02-21
  Filled 2024-02-21: qty 2

## 2024-02-21 MED ORDER — ESTRADIOL 0.1 MG/GM VA CREA: 1.0000 | TOPICAL_CREAM | Freq: Every day | VAGINAL | Status: AC | PRN

## 2024-02-21 MED ORDER — PROPRANOLOL HCL 20 MG PO TABS
20.0000 mg | ORAL_TABLET | Freq: Two times a day (BID) | ORAL | Status: DC | PRN
Start: 2024-02-21 — End: 2024-05-09

## 2024-02-21 MED ORDER — ACETAMINOPHEN 10 MG/ML IV SOLN: 1000.0000 mg | Freq: Once | INTRAVENOUS | Status: DC | PRN

## 2024-02-21 MED ORDER — LIDOCAINE HCL (PF) 2 % IJ SOLN
INTRAMUSCULAR | Status: AC
  Filled 2024-02-21: qty 5

## 2024-02-21 MED ORDER — PROPOFOL 10 MG/ML IV BOLUS
INTRAVENOUS | Status: DC | PRN
  Administered 2024-02-21: 150 mg via INTRAVENOUS
  Administered 2024-02-21: 50 mg via INTRAVENOUS

## 2024-02-21 MED ORDER — ONDANSETRON HCL 4 MG/2ML IJ SOLN: 4.0000 mg | Freq: Four times a day (QID) | INTRAMUSCULAR | Status: DC | PRN

## 2024-02-21 MED ORDER — FENTANYL CITRATE (PF) 100 MCG/2ML IJ SOLN: 25.0000 ug | INTRAMUSCULAR | Status: DC | PRN

## 2024-02-21 MED ORDER — DEXAMETHASONE SODIUM PHOSPHATE 10 MG/ML IJ SOLN
INTRAMUSCULAR | Status: AC
Start: 2024-02-21 — End: 2024-02-21
  Filled 2024-02-21: qty 1

## 2024-02-21 MED ORDER — LACTATED RINGERS IV SOLN: INTRAVENOUS | Status: DC

## 2024-02-21 MED ORDER — PROPOFOL 10 MG/ML IV BOLUS
INTRAVENOUS | Status: AC
  Filled 2024-02-21: qty 20

## 2024-02-21 MED ORDER — SODIUM CHLORIDE 0.9 % IV SOLN: INTRAVENOUS | Status: DC

## 2024-02-21 MED ORDER — ORAL CARE MOUTH RINSE
15.0000 mL | Freq: Once | OROMUCOSAL | Status: AC
Start: 2024-02-21 — End: 2024-02-21

## 2024-02-21 MED ORDER — CHLORHEXIDINE GLUCONATE 0.12 % MT SOLN
15.0000 mL | Freq: Once | OROMUCOSAL | Status: AC
Start: 2024-02-21 — End: 2024-02-21
  Administered 2024-02-21: 15 mL via OROMUCOSAL

## 2024-02-21 MED ORDER — METOCLOPRAMIDE HCL 5 MG/ML IJ SOLN: 5.0000 mg | Freq: Three times a day (TID) | INTRAMUSCULAR | Status: DC | PRN

## 2024-02-21 MED ORDER — ONDANSETRON HCL 4 MG/2ML IJ SOLN
INTRAMUSCULAR | Status: DC | PRN
  Administered 2024-02-21: 4 mg via INTRAVENOUS

## 2024-02-21 MED ORDER — ONDANSETRON HCL 4 MG/2ML IJ SOLN
INTRAMUSCULAR | Status: AC
Start: 2024-02-21 — End: 2024-02-21
  Filled 2024-02-21: qty 2

## 2024-02-21 MED ORDER — ONDANSETRON HCL 4 MG PO TABS
4.0000 mg | ORAL_TABLET | Freq: Four times a day (QID) | ORAL | Status: DC | PRN
Start: 2024-02-21 — End: 2024-02-21

## 2024-02-21 MED ORDER — CHLORHEXIDINE GLUCONATE 0.12 % MT SOLN
OROMUCOSAL | Status: AC
Start: 2024-02-21 — End: 2024-02-21
  Filled 2024-02-21: qty 15

## 2024-02-21 MED ORDER — METOCLOPRAMIDE HCL 10 MG PO TABS: 5.0000 mg | ORAL_TABLET | Freq: Three times a day (TID) | ORAL | Status: DC | PRN

## 2024-02-21 MED ORDER — BUPIVACAINE HCL (PF) 0.5 % IJ SOLN
INTRAMUSCULAR | Status: DC | PRN
Start: 2024-02-21 — End: 2024-02-21
  Administered 2024-02-21: 10 mL

## 2024-02-21 MED ORDER — ACETAMINOPHEN 325 MG PO TABS: 325.0000 mg | ORAL_TABLET | Freq: Four times a day (QID) | ORAL | Status: DC | PRN

## 2024-02-21 MED ORDER — OXYCODONE HCL 5 MG PO TABS: 5.0000 mg | ORAL_TABLET | Freq: Once | ORAL | Status: DC | PRN

## 2024-02-21 MED ORDER — CEFAZOLIN SODIUM-DEXTROSE 2-4 GM/100ML-% IV SOLN
2.0000 g | INTRAVENOUS | Status: AC
  Administered 2024-02-21: 2 g via INTRAVENOUS

## 2024-02-21 MED ORDER — CEFAZOLIN SODIUM-DEXTROSE 2-4 GM/100ML-% IV SOLN
INTRAVENOUS | Status: AC
  Filled 2024-02-21: qty 100

## 2024-02-21 MED ORDER — ONDANSETRON HCL 4 MG/2ML IJ SOLN: 4.0000 mg | Freq: Once | INTRAMUSCULAR | Status: DC | PRN

## 2024-02-21 MED ORDER — HYDROCODONE-ACETAMINOPHEN 5-325 MG PO TABS: 1.0000 | ORAL_TABLET | ORAL | Status: DC | PRN

## 2024-02-21 MED ORDER — LIDOCAINE HCL (CARDIAC) PF 100 MG/5ML IV SOSY
PREFILLED_SYRINGE | INTRAVENOUS | Status: DC | PRN
  Administered 2024-02-21: 50 mg via INTRAVENOUS

## 2024-02-21 MED ORDER — FENTANYL CITRATE (PF) 100 MCG/2ML IJ SOLN
INTRAMUSCULAR | Status: DC | PRN
  Administered 2024-02-21: 25 ug via INTRAVENOUS
  Administered 2024-02-21: 50 ug via INTRAVENOUS
  Administered 2024-02-21: 25 ug via INTRAVENOUS

## 2024-02-21 MED ORDER — BUPIVACAINE HCL (PF) 0.5 % IJ SOLN
INTRAMUSCULAR | Status: AC
  Filled 2024-02-21: qty 30

## 2024-02-21 MED ORDER — OXYCODONE HCL 5 MG/5ML PO SOLN: 5.0000 mg | Freq: Once | ORAL | Status: DC | PRN

## 2024-02-21 MED ORDER — HYDROCODONE-ACETAMINOPHEN 5-325 MG PO TABS: 1.0000 | ORAL_TABLET | Freq: Four times a day (QID) | ORAL | 0 refills | Status: DC | PRN

## 2024-02-21 MED ORDER — DEXAMETHASONE SODIUM PHOSPHATE 10 MG/ML IJ SOLN
INTRAMUSCULAR | Status: DC | PRN
  Administered 2024-02-21: 10 mg via INTRAVENOUS

## 2024-02-21 SURGICAL SUPPLY — 27 items
BNDG COHESIVE 4X5 TAN STRL LF (GAUZE/BANDAGES/DRESSINGS) ×1 IMPLANT
BNDG ELASTIC 4X5.8 VLCR NS LF (GAUZE/BANDAGES/DRESSINGS) ×1 IMPLANT
BNDG ESMARCH 4X12 STRL LF (GAUZE/BANDAGES/DRESSINGS) ×1 IMPLANT
CHLORAPREP W/TINT 26 (MISCELLANEOUS) ×1 IMPLANT
CORD BIP STRL DISP 12FT (MISCELLANEOUS) ×1 IMPLANT
DRAPE SURG 17X11 SM STRL (DRAPES) ×1 IMPLANT
ELECTRODE REM PT RTRN 9FT ADLT (ELECTROSURGICAL) ×1 IMPLANT
FORCEPS JEWEL BIP 4-3/4 STR (INSTRUMENTS) ×1 IMPLANT
GAUZE SPONGE 4X4 12PLY STRL (GAUZE/BANDAGES/DRESSINGS) ×1 IMPLANT
GLOVE BIO SURGEON STRL SZ8 (GLOVE) ×1 IMPLANT
GLOVE INDICATOR 8.0 STRL GRN (GLOVE) ×1 IMPLANT
GOWN STRL REUS W/ TWL LRG LVL3 (GOWN DISPOSABLE) ×1 IMPLANT
GOWN STRL REUS W/ TWL XL LVL3 (GOWN DISPOSABLE) ×1 IMPLANT
KIT TURNOVER KIT A (KITS) ×1 IMPLANT
MANIFOLD NEPTUNE II (INSTRUMENTS) ×1 IMPLANT
NS IRRIG 500ML POUR BTL (IV SOLUTION) ×1 IMPLANT
PACK EXTREMITY ARMC (MISCELLANEOUS) ×1 IMPLANT
PAD CAST 4YDX4 CTTN HI CHSV (CAST SUPPLIES) ×1 IMPLANT
PAD PREP OB/GYN DISP 24X41 (PERSONAL CARE ITEMS) ×1 IMPLANT
PENCIL SMOKE EVACUATOR (MISCELLANEOUS) ×1 IMPLANT
STOCKINETTE IMPERV 14X48 (MISCELLANEOUS) ×1 IMPLANT
STRIP CLOSURE SKIN 1/2X4 (GAUZE/BANDAGES/DRESSINGS) ×1 IMPLANT
SUT PROLENE 4 0 PS 2 18 (SUTURE) ×1 IMPLANT
SUT VIC AB 2-0 SH 27XBRD (SUTURE) ×1 IMPLANT
SUT VIC AB 3-0 PS2 18XBRD (SUTURE) ×1 IMPLANT
TRAP FLUID SMOKE EVACUATOR (MISCELLANEOUS) ×1 IMPLANT
WATER STERILE IRR 500ML POUR (IV SOLUTION) ×1 IMPLANT

## 2024-02-21 NOTE — Anesthesia Procedure Notes (Signed)
 Procedure Name: LMA Insertion Date/Time: 02/21/2024 11:49 AM  Performed by: Tera Fellows, CRNAPre-anesthesia Checklist: Patient identified, Emergency Drugs available, Suction available and Patient being monitored Patient Re-evaluated:Patient Re-evaluated prior to induction Oxygen Delivery Method: Circle system utilized Preoxygenation: Pre-oxygenation with 100% oxygen Induction Type: IV induction Ventilation: Mask ventilation without difficulty LMA: LMA inserted LMA Size: 4.0 Number of attempts: 1 Placement Confirmation: positive ETCO2 and breath sounds checked- equal and bilateral Tube secured with: Tape Dental Injury: Teeth and Oropharynx as per pre-operative assessment

## 2024-02-21 NOTE — Op Note (Signed)
 02/21/2024  12:22 PM  Patient:   Lindsey Blair  Pre-Op Diagnosis:   Benign neoplasm of left long finger.  Post-Op Diagnosis:   Benign neoplasm consistent with mucoid cyst of left long DIP joint  Procedure:   Excision of benign soft tissue mass left long DIP joint  Surgeon:   Lonnie Roberts, MD  Assistant:   None  Anesthesia:   General LMA  Findings:   As above.  Complications:   None  Fluids:   300 cc crystalloid  EBL:   0 cc  TT:   11 minutes at 250 mmHg  Drains:   None  Closure:   4-0 Prolene interrupted sutures  Brief Clinical Note:   The patient is a 78 year old female with a 3+ year history of a persistent painful soft tissue mass on the dorsal aspect of the left long finger at the level of the DIP joint.  This mass has persisted despite medications, activity modification, etc.  Her history and examination consistent with a soft tissue mass of uncertain etiology.  The patient presents at this time for excision of the soft tissue mass from the dorsal aspect of her left long finger.  Procedure:   The patient was brought into the operating room and laid in the supine position.  After adequate general laryngeal mask anesthesia was obtained, the patient's left hand and upper extremity were prepped with ChloraPrep solution before being draped sterilely.  Preoperative antibiotics were administered.  A timeout was performed to verify the appropriate surgical site before the limb was exsanguinated with an Esmarch and the tourniquet inflated to 250 mmHg.  The skin around the 4-5 mm diameter soft tissue mass was incised in an elliptical fashion.  The incision was carried down through the subcutaneous tissues to remove the mass.  As dissection was carried down, clear gelatinous material was encountered, consistent with a mucoid cyst/ganglion cyst.  The underlying extensor tendon was in excellent condition.  The wound was copiously irrigated with sterile saline solution before being  closed using 4-0 Prolene interrupted sutures.  A sterile bulky dressing was applied to the finger.  In addition, a total of 10 cc of 0.5% plain Sensorcaine was injected in and around the incision site as well as to perform a digital nerve block to help with postoperative analgesia.  The patient was then awakened, extubated, and returned to the recovery room in satisfactory condition after tolerating the procedure well.

## 2024-02-21 NOTE — Anesthesia Preprocedure Evaluation (Signed)
 Anesthesia Evaluation  Patient identified by MRN, date of birth, ID band Patient awake    Reviewed: Allergy & Precautions, NPO status , Patient's Chart, lab work & pertinent test results  History of Anesthesia Complications Negative for: history of anesthetic complications  Airway Mallampati: II  TM Distance: >3 FB Neck ROM: Full    Dental no notable dental hx. (+) Teeth Intact   Pulmonary neg pulmonary ROS, neg sleep apnea, neg COPD, Patient abstained from smoking.Not current smoker, former smoker   Pulmonary exam normal breath sounds clear to auscultation       Cardiovascular Exercise Tolerance: Good METShypertension, Pt. on medications (-) CAD and (-) Past MI (-) dysrhythmias  Rhythm:Regular Rate:Normal - Systolic murmurs    Neuro/Psych  PSYCHIATRIC DISORDERS Anxiety Depression    negative neurological ROS     GI/Hepatic ,neg GERD  ,,(+)     (-) substance abuse    Endo/Other  neg diabetes    Renal/GU CRFRenal disease     Musculoskeletal   Abdominal   Peds  Hematology   Anesthesia Other Findings Past Medical History: No date: Anxiety No date: Benign essential tremor Several instances of the years: Cancer [facility])     Comment:  Skin No date: Depression No date: Heart murmur No date: Hyperlipidemia No date: Hypertension 01/2024: Mass of soft tissue of hand     Comment:  dorsal aspect of left long DIP joint No date: Osteopenia No date: Reflux esophagitis No date: Stage 3a chronic kidney disease (CKD) (HCC) No date: Systemic lupus erythematosus (HCC) No date: Vertigo     Comment:  rare  Reproductive/Obstetrics                             Anesthesia Physical Anesthesia Plan  ASA: 2  Anesthesia Plan: General   Post-op Pain Management: Ofirmev  IV (intra-op)*   Induction: Intravenous  PONV Risk Score and Plan: 3 and Ondansetron, Dexamethasone and Treatment may vary due to age  or medical condition  Airway Management Planned: LMA  Additional Equipment: None  Intra-op Plan:   Post-operative Plan: Extubation in OR  Informed Consent: I have reviewed the patients History and Physical, chart, labs and discussed the procedure including the risks, benefits and alternatives for the proposed anesthesia with the patient or authorized representative who has indicated his/her understanding and acceptance.     Dental advisory given  Plan Discussed with: CRNA and Surgeon  Anesthesia Plan Comments: (Discussed risks of anesthesia with patient, including PONV, sore throat, lip/dental/eye damage, post operative cognitive dysfunction. Rare risks discussed as well, such as cardiorespiratory and neurological sequelae, and allergic reactions. Discussed the role of CRNA in patient's perioperative care. Patient understands.)       Anesthesia Quick Evaluation

## 2024-02-21 NOTE — Transfer of Care (Signed)
 Immediate Anesthesia Transfer of Care Note  Patient: Lindsey Blair  Procedure(s) Performed: EXCISION MASS UPPER EXTREMITIES (Left: Middle Finger)  Patient Location: PACU  Anesthesia Type:General  Level of Consciousness: drowsy  Airway & Oxygen Therapy: Patient Spontanous Breathing  Post-op Assessment: Report given to RN and Post -op Vital signs reviewed and stable  Post vital signs: Reviewed and stable  Last Vitals:  Vitals Value Taken Time  BP 112/73 02/21/24 12:24  Temp 36 C 02/21/24 12:24  Pulse 56 02/21/24 12:24  Resp 9 02/21/24 12:24  SpO2 99 % 02/21/24 12:24    Last Pain:  Vitals:   02/21/24 0902  TempSrc: Temporal  PainSc: 0-No pain         Complications: No notable events documented.

## 2024-02-21 NOTE — Anesthesia Postprocedure Evaluation (Signed)
 Anesthesia Post Note  Patient: Lindsey Blair  Procedure(s) Performed: EXCISION MASS UPPER EXTREMITIES (Left: Middle Finger)  Patient location during evaluation: PACU Anesthesia Type: General Level of consciousness: awake and alert Pain management: pain level controlled Vital Signs Assessment: post-procedure vital signs reviewed and stable Respiratory status: spontaneous breathing, nonlabored ventilation, respiratory function stable and patient connected to nasal cannula oxygen Cardiovascular status: blood pressure returned to baseline and stable Postop Assessment: no apparent nausea or vomiting Anesthetic complications: no   No notable events documented.   Last Vitals:  Vitals:   02/21/24 1245 02/21/24 1300  BP: (!) 160/94 (!) 167/96  Pulse: 73 64  Resp: 17 11  Temp: (!) 36.3 C (!) 36.1 C  SpO2: 99% 100%    Last Pain:  Vitals:   02/21/24 1300  TempSrc:   PainSc: 0-No pain                 Lattie Poli

## 2024-02-21 NOTE — Discharge Instructions (Addendum)
 Orthopedic discharge instructions: Keep dressing dry and intact. Keep hand elevated above heart level. May shower after dressing removed on postop day 4 (Monday). Cover wound with Band-Aid after drying off. Apply ice to affected area frequently. Resume Mobic 15 mg daily OR take ibuprofen 600 mg TID with meals for 3-5 days, then as necessary. Take ES Tylenol  or pain medication as prescribed when needed.  Return for follow-up in 10-14 days or as scheduled.

## 2024-02-21 NOTE — H&P (Signed)
 History of Present Illness:  Lindsey Blair is a 78 y.o. female who presents for evaluation and treatment of a painful soft tissue mass on the dorsal aspect of her left long DIP joint. The patient apparently had been seen by one of our PAs about 3 years ago for this issue and had been scheduled to have the presumed mucoid cyst removed by Dr. Mozell Arias. However, it got better before her surgery so she canceled the surgery. Over the past year or so, the mass has returned and has become more painful, especially with movement of the DIP joint. She rates her pain at 7/10 on today's visit. She has been taking Tylenol  as necessary with limited benefit. Her symptoms are worse with more repetitive activities using her hand. She denies any swelling or erythema around the finger. She denies any reinjury to the hand, and denies any fevers or chills.  Current Outpatient Medications:  acetaminophen  (TYLENOL ) 650 MG ER tablet Take 650 mg by mouth  amLODIPine  (NORVASC ) 10 MG tablet Take by mouth  amLODIPine -benazepril  (LOTREL) 10-20 mg capsule Take 1 capsule by mouth once daily  atorvastatin  (LIPITOR) 20 MG tablet Take 20 mg by mouth once daily  buPROPion  (WELLBUTRIN  SR) 150 MG SR tablet Take 200 mg by mouth 2 (two) times daily  calcium  carbonate 500 mg calcium  (1,250 mg) tablet Take 500 mg of elemental by mouth 2 (two) times daily with meals  cholecalciferol 1000 unit tablet Take by mouth  clobetasoL (TEMOVATE) 0.05 % cream  cyanocobalamin  (VITAMIN B12) 1000 MCG tablet Take 1,000 mcg by mouth once daily  estradioL  (ESTRACE ) 0.01 % (0.1 mg/gram) vaginal cream Place vaginally  hydroCHLOROthiazide  (HYDRODIURIL ) 12.5 MG tablet  hydrOXYchloroQUINE (PLAQUENIL) 200 mg tablet Take 200 mg by mouth 1 tablet in the morning and 0.5 tablet at night  melatonin 3 mg tablet Take 3 mg by mouth  meloxicam (MOBIC) 15 MG tablet  multivitamin with minerals tablet Take by mouth  pantoprazole  (PROTONIX ) 40 MG DR tablet Take by mouth   pantoprazole  (PROTONIX ) 40 MG DR tablet Take 40 mg by mouth once daily  propranoloL  (INDERAL ) 20 MG tablet Take 20 mg by mouth 2 (two) times daily  testosterone  (ANDROGEL ) 20.25 mg/1.25 gram (1.62 %) gel in metered dose pump Place onto the skin Place 1 application onto the skin 3 (three) times a week. I am unable to make the computer do a 4% gel but please use  triamcinolone  0.1 % cream   Allergies:  Penicillins Rash   Past Medical History:  Hypertension   Past Surgical History: No pertinent surgical history.   Past Family History: No family history on file.   Social History:   Socioeconomic History:  Marital status: Married  Tobacco Use  Smoking status: Never  Smokeless tobacco: Never  Substance and Sexual Activity  Alcohol use: Yes  Drug use: Never  Sexual activity: Defer   Social Drivers of Health:   Physicist, medical Strain: Low Risk (10/10/2023)  Received from [facility]  Overall Financial Resource Strain (CARDIA)  Difficulty of Paying Living Expenses: Not hard at all  Food Insecurity: No Food Insecurity (10/10/2023)  Received from [facility]  Hunger Vital Sign  Worried About Running Out of Food in the Last Year: Never true  Ran Out of Food in the Last Year: Never true  Transportation Needs: No Transportation Needs (10/10/2023)  Received from [facility]  Lack of Transportation (Medical): No  Lack of Transportation (Non-Medical): No   Review of Systems:  A comprehensive 14 point ROS was performed, reviewed, and the pertinent orthopaedic findings are documented in the HPI.  Physical Exam: Vitals:  01/22/24 0934  BP: 130/76  Weight: 57.2 kg (126 lb)  Height: 157.5 cm (5' 2)  PainSc: 7  PainLoc: Finger   General/Constitutional: The patient appears to be well-nourished, well-developed, and in no acute distress. Neuro/Psych: Normal mood and affect, oriented to person, place and time. Eyes: Non-icteric. Pupils are equal, round, and  reactive to light, and exhibit synchronous movement. ENT: Unremarkable. Lymphatic: No palpable adenopathy. Respiratory: Lungs clear to auscultation, Normal chest excursion, No wheezes, and Non-labored breathing Cardiovascular: Regular rate and rhythm. No murmurs. and No edema, swelling or tenderness, except as noted in detailed exam. Integumentary: No impressive skin lesions present, except as noted in detailed exam. Musculoskeletal: Unremarkable, except as noted in detailed exam.  Left hand exam: Skin inspection of the left hand is notable for a firm raised soft tissue mass over the dorsal ulnar aspect of the long finger at the level of the DIP measuring approximately 3 to 4 mm in greatest diameter. There appears to be some scabbing/crustiness at the center of the lesion, possibly more consistent with a wart versus a mucoid cyst. No swelling, erythema, ecchymosis, abrasions, or other skin abnormalities are identified. She is able to actively flex and extend all digits fully without any pain or triggering. She is neurovascularly intact to the tips of all digits.  X-rays/MRI/Lab data:  AP, lateral, and oblique views of the left hand are obtained. These films demonstrate no evidence of fractures, lytic lesions, or significant degenerative changes, especially involving the long DIP joint. There is a soft tissue shadow consistent with the location of her soft tissue mass on the dorsal aspect of the long finger at the level of the DIP joint.  Assessment: Benign neoplasm of skin of left long finger.   Plan: The treatment options were discussed with the patient. In addition, patient educational materials were provided regarding the diagnosis and treatment options. The patient is quite frustrated by her symptoms and functional limitations, and is ready to consider more aggressive treatment options. Therefore, I have recommended a surgical procedure, specifically an excision of the soft tissue mass on the  dorsal aspect of the left long DIP joint. The procedure was discussed with the patient, as were the potential risks (including bleeding, infection, nerve and/or blood vessel injury, persistent or recurrent pain, recurrence of the mass, stiffness of the finger, need for further surgery, blood clots, strokes, heart attacks and/or arhythmias, pneumonia, etc.) and benefits. The patient states her understanding and wishes to proceed. All of the patient's questions and concerns were answered. She can call any time with further concerns. She will follow up post-surgery, routine.    H&P reviewed and patient re-examined. No changes.

## 2024-02-22 ENCOUNTER — Encounter: Payer: Self-pay | Admitting: Surgery

## 2024-02-22 LAB — SURGICAL PATHOLOGY

## 2024-03-01 ENCOUNTER — Telehealth: Payer: Self-pay | Admitting: Nurse Practitioner

## 2024-03-01 NOTE — Telephone Encounter (Signed)
 Copied from CRM [id number]. Topic: Clinical - Medication Refill >> Mar 01, 2024  1:49 PM Savanna F wrote: Medication: Testosterone  1.62 % GEL (States this is 4%, not 1.62% but is not in her med list  Has the patient contacted their pharmacy? Yes (Agent: If no, request that the patient contact the pharmacy for the refill. If patient does not wish to contact the pharmacy document the reason why and proceed with request.) (Agent: If yes, when and what did the pharmacy advise?)  This is the patient's preferred pharmacy:  [facility] - [place], [place] - [address] [phone] [address] [place] [address]  Is this the correct pharmacy for this prescription? Yes If no, delete pharmacy and type the correct one.   Has the prescription been filled recently? No  Is the patient out of the medication? Yes  Has the patient been seen for an appointment in the last year OR does the patient have an upcoming appointment? Yes  Can we respond through MyChart? No, please call  Agent: Please be advised that Rx refills may take up to 3 business days. We ask that you follow-up with your pharmacy.

## 2024-03-05 ENCOUNTER — Other Ambulatory Visit: Payer: Self-pay | Admitting: Nurse Practitioner

## 2024-03-05 DIAGNOSIS — Z1231 Encounter for screening mammogram for malignant neoplasm of breast: Secondary | ICD-10-CM

## 2024-03-06 DIAGNOSIS — Z9889 Other specified postprocedural states: Secondary | ICD-10-CM | POA: Diagnosis not present

## 2024-03-06 DIAGNOSIS — D2360 Other benign neoplasm of skin of unspecified upper limb, including shoulder: Secondary | ICD-10-CM | POA: Diagnosis not present

## 2024-03-11 ENCOUNTER — Other Ambulatory Visit: Payer: Self-pay | Admitting: Nurse Practitioner

## 2024-03-11 MED ORDER — TESTOSTERONE 1.62 % TD GEL: TRANSDERMAL | 1 refills | Status: AC

## 2024-03-12 DIAGNOSIS — R809 Proteinuria, unspecified: Secondary | ICD-10-CM | POA: Diagnosis not present

## 2024-03-12 DIAGNOSIS — M3210 Systemic lupus erythematosus, organ or system involvement unspecified: Secondary | ICD-10-CM | POA: Diagnosis not present

## 2024-03-12 DIAGNOSIS — N1831 Chronic kidney disease, stage 3a: Secondary | ICD-10-CM | POA: Diagnosis not present

## 2024-03-12 DIAGNOSIS — R319 Hematuria, unspecified: Secondary | ICD-10-CM | POA: Diagnosis not present

## 2024-03-12 DIAGNOSIS — I129 Hypertensive chronic kidney disease with stage 1 through stage 4 chronic kidney disease, or unspecified chronic kidney disease: Secondary | ICD-10-CM | POA: Diagnosis not present

## 2024-03-19 DIAGNOSIS — R809 Proteinuria, unspecified: Secondary | ICD-10-CM | POA: Diagnosis not present

## 2024-03-19 DIAGNOSIS — R319 Hematuria, unspecified: Secondary | ICD-10-CM | POA: Diagnosis not present

## 2024-03-19 DIAGNOSIS — M329 Systemic lupus erythematosus, unspecified: Secondary | ICD-10-CM | POA: Diagnosis not present

## 2024-03-19 DIAGNOSIS — I129 Hypertensive chronic kidney disease with stage 1 through stage 4 chronic kidney disease, or unspecified chronic kidney disease: Secondary | ICD-10-CM | POA: Diagnosis not present

## 2024-03-19 DIAGNOSIS — N1831 Chronic kidney disease, stage 3a: Secondary | ICD-10-CM | POA: Diagnosis not present

## 2024-03-22 NOTE — Telephone Encounter (Signed)
 Called and spoke with pharmacy again who has taken the script down as 0.25 mL

## 2024-03-22 NOTE — Telephone Encounter (Signed)
 Copied from CRM [id number]. Topic: Clinical - Prescription Issue >> Mar 22, 2024 10:16 AM Martinique E wrote: Reason for CRM: Patient has been trying to get her Testosterone  1.62 % GEL  since 6/27 and she is stating it should be 4% as the wrong amount got sent over. Patient questioning if the new script was sent in.

## 2024-04-08 ENCOUNTER — Encounter

## 2024-04-10 ENCOUNTER — Ambulatory Visit
Admission: RE | Admit: 2024-04-10 | Discharge: 2024-04-10 | Disposition: A | Discharge status: Home / Self Care | Source: Ambulatory Visit | Attending: Nurse Practitioner | Admitting: Nurse Practitioner

## 2024-04-10 DIAGNOSIS — Z1231 Encounter for screening mammogram for malignant neoplasm of breast: Secondary | ICD-10-CM | POA: Diagnosis not present

## 2024-04-15 ENCOUNTER — Ambulatory Visit: Payer: Self-pay

## 2024-04-15 ENCOUNTER — Ambulatory Visit: Payer: Self-pay | Admitting: Nurse Practitioner

## 2024-04-15 NOTE — Telephone Encounter (Signed)
 FYI Only or Action Required?: FYI only for provider.  Patient was last seen in primary care on 01/30/2024 by Gretel App, NP.  Called Nurse Triage reporting Palpitations.  Symptoms began x 4 weeks.  Interventions attempted: Nothing.  Symptoms are: gradually worsening.  Triage Disposition: No disposition on file.  Patient/caregiver understands and will follow disposition?: Yes     Copied from CRM [id number]. Topic: Clinical - Red Word Triage >> Apr 15, 2024  2:24 PM Chiquita SQUIBB wrote: Red Word that prompted transfer to Nurse Triage: Patient is calling in for an appointment but the patient stated her normal heart rate is in the 60's but for the last two weeks it has been in the 40's.    ----------------------------------------------------------------------- From previous Reason for Contact - Scheduling: Patient/patient representative is calling to schedule an appointment. Refer to attachments for appointment information. Answer Assessment - Initial Assessment Questions 1. DESCRIPTION: Please describe your heart rate or heartbeat that you are having (e.g., fast/slow, regular/irregular, skipped or extra beats, palpitations)     no 2. ONSET: When did it start? (e.g., minutes, hours, days)      X 4o week 3. DURATION: How long does it last (e.g., seconds, minutes, hours)     unknown 4. PATTERN Does it come and go, or has it been constant since it started?  Does it get worse with exertion?   Are you feeling it now?     Comes and goes 5. TAP: Using your hand, can you tap out what you are feeling on a chair or table in front of you, so that I can hear? Note: Not all patients can do this.       na 6. HEART RATE: Can you tell me your heart rate? How many beats in 15 seconds?  Note: Not all patients can do this.       40's 7. RECURRENT SYMPTOM: Have you ever had this before? If Yes, ask: When was the last time? and What happened that time?      no 8. CAUSE: What do  you think is causing the palpitations?     unknown 9. CARDIAC HISTORY: Do you have any history of heart disease? (e.g., heart attack, angina, bypass surgery, angioplasty, arrhythmia)      murmu 10. OTHER SYMPTOMS: Do you have any other symptoms? (e.g., dizziness, chest pain, sweating, difficulty breathing)       no 11. PREGNANCY: Is there any chance you are pregnant? When was your last menstrual period?       na Bilateral emeda - mild leg  Protocols used: Heart Rate and Heartbeat Questions-A-AH

## 2024-04-16 ENCOUNTER — Ambulatory Visit (INDEPENDENT_AMBULATORY_CARE_PROVIDER_SITE_OTHER): Admitting: Nurse Practitioner

## 2024-04-16 ENCOUNTER — Encounter: Payer: Self-pay | Admitting: Nurse Practitioner

## 2024-04-16 VITALS — BP 130/78 | HR 49 | Temp 98.4°F | Resp 20 | Ht 61.5 in | Wt 123.2 lb

## 2024-04-16 DIAGNOSIS — R001 Bradycardia, unspecified: Secondary | ICD-10-CM

## 2024-04-16 DIAGNOSIS — G4700 Insomnia, unspecified: Secondary | ICD-10-CM

## 2024-04-16 MED ORDER — HYDROXYZINE HCL 10 MG PO TABS
10.0000 mg | ORAL_TABLET | Freq: Every day | ORAL | 0 refills | Status: DC
Start: 2024-04-16 — End: 2024-05-22

## 2024-04-16 NOTE — Patient Instructions (Signed)
 YOUR PLAN:  BRADYCARDIA: Your heart rate has been consistently low, likely due to the propranolol  you are taking for tremor control. -Stop taking propranolol  and monitor your heart rate. -Check your medication box to confirm the presence of propranolol . -Follow up with your heart rate readings after stopping propranolol .  TREMOR: You have a tremor for which propranolol  was prescribed. There is a family history of tremors. -Stop taking propranolol  and monitor for any changes in your tremor.  INSOMNIA: You have chronic difficulty maintaining sleep. You previously used trazodone  but had to stop due to potential interactions with your lupus medication. -Prescribe hydroxyzine  10 mg to take at bedtime. -Do not take melatonin with hydroxyzine  to avoid excessive sedation. -Follow up in one month to assess the effectiveness of hydroxyzine .

## 2024-04-16 NOTE — Progress Notes (Signed)
 Established Patient Office Visit  Subjective:  Patient ID: Lindsey Blair, female    DOB: [birth year]  Age: 78 y.o. MRN: [id number]  CC:  Chief Complaint  Patient presents with   Irregular Heart Beat    Been running in the high 40's    Discussed the use of a AI scribe software for clinical note transcription with the patient, who gave verbal consent to proceed.  HPI  Lindsey Blair is a 78 year old female who presents with concerns about a low heart rate.  Her heart rate at home is consistently in the high forties to low fifties, with no readings below 45. She experiences no dizziness, syncope, chest pain, or shortness of breath and remains active, engaging in activities such as cutting wood, riding horses, and mowing the yard without difficulty.   She has resumed taking propranolol  regularly in the last couple of weeks for tremor control.  She experiences sleep difficulties, often waking up multiple times during the night. She uses melatonin to aid sleep and has previously tried trazodone  but was advised to stop due to potential interactions with her lupus medication. She is interested in trying hydroxyzine  for her sleep issues.  HPI   Past Medical History:  Diagnosis Date   Anxiety    Benign essential tremor    Cancer (HCC) Several instances of the years   Skin   Depression    Heart murmur    Hyperlipidemia    Hypertension    Mass of soft tissue of hand 01/2024   dorsal aspect of left long DIP joint   Osteopenia    Reflux esophagitis    Stage 3a chronic kidney disease (CKD) (HCC)    Systemic lupus erythematosus (HCC)    Vertigo    rare    Past Surgical History:  Procedure Laterality Date   BASAL CELL CARCINOMA EXCISION     several face, nose   BREAST BIOPSY Right 2000   Negative   CATARACT EXTRACTION W/ INTRAOCULAR LENS IMPLANT Right 08/05/2019   CATARACT EXTRACTION W/ INTRAOCULAR LENS IMPLANT Left    COLONOSCOPY WITH PROPOFOL  N/A 10/22/2015    Procedure: COLONOSCOPY WITH PROPOFOL ;  Surgeon: Rogelia Copping, MD;  Location: [facility];  Service: Endoscopy;  Laterality: N/A;   ESOPHAGOGASTRODUODENOSCOPY (EGD) WITH PROPOFOL  N/A 10/22/2015   Procedure: ESOPHAGOGASTRODUODENOSCOPY (EGD) WITH PROPOFOL ;  Surgeon: Rogelia Copping, MD;  Location: [facility];  Service: Endoscopy;  Laterality: N/A;   EXCISION MASS UPPER EXTREMETIES Left 02/21/2024   Procedure: EXCISION MASS UPPER EXTREMITIES;  Surgeon: Edie Norleen JINNY, MD;  Location: [facility];  Service: Orthopedics;  Laterality: Left;   IMAGE GUIDED SINUS SURGERY  04/06/2011   [facility], Dr. Milissa   MASTOIDECTOMY     MOHS SURGERY      Family History  Problem Relation Age of Onset   Heart disease Mother    Leukemia Father    Stroke Father    Cerebral aneurysm Sister    Prostate cancer Brother    Prostate cancer Brother    Prostate cancer Brother    Lung cancer Sister    Bladder Cancer Sister    Liver cancer Sister    Brain cancer Sister    Heart attack Sister    Breast cancer Neg Hx     Social History   Socioeconomic History   Marital status: Married    Spouse name: Alm   Number of children: 0   Years of education: Not on file   Highest  education level: Some college, no degree  Occupational History   Occupation: retired   Tobacco Use   Smoking status: Former    Current packs/day: 0.00    Types: Cigarettes    Quit date: 03/08/1965    Years since quitting: 59.2   Smokeless tobacco: Never  Vaping Use   Vaping status: Never Used  Substance and Sexual Activity   Alcohol use: Yes    Alcohol/week: 5.0 standard drinks of alcohol    Types: 2 Glasses of wine, 3 Shots of liquor per week    Comment: daily   Drug use: No   Sexual activity: Yes  Other Topics Concern   Not on file  Social History Narrative   Married   Social Drivers of Health   Financial Resource Strain: Low Risk  (01/23/2024)   Received from [facility]   Overall Financial Resource  Strain (CARDIA)    Difficulty of Paying Living Expenses: Not hard at all  Food Insecurity: No Food Insecurity (01/23/2024)   Received from [facility]   Hunger Vital Sign    Within the past 12 months, you worried that your food would run out before you got the money to buy more.: Never true    Within the past 12 months, the food you bought just didn't last and you didn't have money to get more.: Never true  Transportation Needs: No Transportation Needs (01/23/2024)   Received from [facility] - Transportation    In the past 12 months, has lack of transportation kept you from medical appointments or from getting medications?: No    Lack of Transportation (Non-Medical): No  Physical Activity: Inactive (10/10/2023)   Exercise Vital Sign    Days of Exercise per Week: 0 days    Minutes of Exercise per Session: 0 min  Stress: No Stress Concern Present (10/10/2023)   [employer] of Occupational Health - Occupational Stress Questionnaire    Feeling of Stress : Not at all  Social Connections: Moderately Integrated (10/10/2023)   Social Connection and Isolation Panel    Frequency of Communication with Friends and Family: More than three times a week    Frequency of Social Gatherings with Friends and Family: More than three times a week    Attends Religious Services: More than 4 times per year    Active Member of [employer] or Organizations: No    Attends Banker Meetings: Never    Marital Status: Married  Catering manager Violence: Not At Risk (10/10/2023)   Humiliation, Afraid, Rape, and Kick questionnaire    Fear of Current or Ex-Partner: No    Emotionally Abused: No    Physically Abused: No    Sexually Abused: No     Outpatient Medications Prior to Visit  Medication Sig Dispense Refill   amLODipine -benazepril  (LOTREL) 10-20 MG capsule Take 1 capsule by mouth daily. 90 capsule 3   atorvastatin  (LIPITOR) 20 MG tablet Take 1 tablet (20 mg  total) by mouth daily. 90 tablet 3   buPROPion  (WELLBUTRIN  SR) 200 MG 12 hr tablet Take 1 tablet (200 mg total) by mouth 2 (two) times daily. 180 tablet 3   calcium  carbonate (OS-CAL) 1250 (500 Ca) MG chewable tablet Chew 1 tablet by mouth daily.     Cholecalciferol (VITAMIN D ) 50 MCG (2000 UT) tablet Take 2,000 Units by mouth daily.     clobetasol cream (TEMOVATE) 0.05 % Apply 1 Application topically 2 (two) times daily as  needed (lupus flare).     cyanocobalamin  (VITAMIN B12) 1000 MCG tablet Take 1,000 mcg by mouth daily.     estradiol  (ESTRACE ) 0.1 MG/GM vaginal cream Place 1 Applicatorful vaginally daily as needed (dryness / irritation).     hydrochlorothiazide  (HYDRODIURIL ) 12.5 MG tablet Take 1 tablet (12.5 mg total) by mouth daily. 90 tablet 3   HYDROcodone -acetaminophen  (NORCO/VICODIN) 5-325 MG tablet Take 1-2 tablets by mouth every 6 (six) hours as needed for moderate pain (pain score 4-6) or severe pain (pain score 7-10). 10 tablet 0   hydroxychloroquine (PLAQUENIL) 200 MG tablet Take 300 mg by mouth daily.     melatonin 5 MG TABS Take 5 mg by mouth at bedtime.     meloxicam (MOBIC) 15 MG tablet Take 15 mg by mouth daily as needed for pain.     Multiple Vitamin (MULTIVITAMIN WITH MINERALS) TABS tablet Take 1 tablet by mouth daily.     pantoprazole  (PROTONIX ) 40 MG tablet Take 1 tablet (40 mg total) by mouth daily. 90 tablet 0   propranolol  (INDERAL ) 20 MG tablet Take 1 tablet (20 mg total) by mouth 2 (two) times daily as needed (shakes).     Testosterone  1.62 % GEL Place 1 application  onto the skin 3 (three) times a week. I am unable to make the computer do a 4% gel but please use. 75 g 1   tiZANidine  (ZANAFLEX ) 4 MG tablet Take 1 tablet (4 mg total) by mouth every 8 (eight) hours as needed for muscle spasms. 30 tablet 4   No facility-administered medications prior to visit.    Allergies  Allergen Reactions   Penicillins Rash    ROS Review of Systems Negative unless indicated  in HPI.    Objective:    Physical Exam Constitutional:      Appearance: Normal appearance.  Eyes:     Conjunctiva/sclera: Conjunctivae normal.     Pupils: Pupils are equal, round, and reactive to light.  Cardiovascular:     Rate and Rhythm: Normal rate and regular rhythm.     Pulses: Normal pulses.     Heart sounds: Normal heart sounds.  Pulmonary:     Effort: Pulmonary effort is normal.     Breath sounds: Normal breath sounds.  Musculoskeletal:     Cervical back: Normal range of motion. No tenderness.  Skin:    General: Skin is warm.     Findings: No bruising.  Neurological:     General: No focal deficit present.     Mental Status: She is alert and oriented to person, place, and time. Mental status is at baseline.  Psychiatric:        Mood and Affect: Mood normal.        Behavior: Behavior normal.        Thought Content: Thought content normal.     BP 130/78   Pulse (!) 49   Temp 98.4 F (36.9 C)   Resp 20   Ht 5' 1.5 (1.562 m)   Wt 123 lb 4 oz (55.9 kg)   SpO2 99%   BMI 22.91 kg/m  Wt Readings from Last 3 Encounters:  04/16/24 123 lb 4 oz (55.9 kg)  02/21/24 119 lb (54 kg)  02/14/24 119 lb (54 kg)     Health Maintenance  Topic Date Due   COVID-19 Vaccine (4 - 2024-25 season) 05/07/2023   INFLUENZA VACCINE  12/03/2024 (Originally 04/05/2024)   Medicare Annual Wellness (AWV)  10/09/2024   MAMMOGRAM  04/10/2025  DTaP/Tdap/Td (3 - Tdap) 10/07/2025   DEXA SCAN  Completed   Hepatitis C Screening  Completed   Zoster Vaccines- Shingrix  Completed   HPV VACCINES  Aged Out   Meningococcal B Vaccine  Aged Out   Pneumococcal Vaccine: 50+ Years  Discontinued   Colonoscopy  Discontinued    There are no preventive care reminders to display for this patient.  Lab Results  Component Value Date   TSH 1.250 07/20/2022   Lab Results  Component Value Date   WBC 4.3 02/15/2024   HGB 11.4 (L) 02/15/2024   HCT 36.4 02/15/2024   MCV 81.6 02/15/2024   PLT 192  02/15/2024   Lab Results  Component Value Date   NA 137 01/30/2024   K 4.5 01/30/2024   CO2 29 01/30/2024   GLUCOSE 86 01/30/2024   BUN 15 01/30/2024   CREATININE 1.17 01/30/2024   BILITOT 0.6 01/30/2024   ALKPHOS 56 01/30/2024   AST 23 01/30/2024   ALT 20 01/30/2024   PROT 6.7 01/30/2024   ALBUMIN 4.4 01/30/2024   CALCIUM  9.9 01/30/2024   EGFR 45 (L) 01/18/2023   GFR 44.56 (L) 01/30/2024   Lab Results  Component Value Date   CHOL 176 01/30/2024   Lab Results  Component Value Date   HDL 71.50 01/30/2024   Lab Results  Component Value Date   LDLCALC 86 01/30/2024   Lab Results  Component Value Date   TRIG 93.0 01/30/2024   Lab Results  Component Value Date   CHOLHDL 2 01/30/2024   No results found for: HGBA1C    Assessment & Plan:  Bradycardia Assessment & Plan: Bradycardia with heart rate of 49.  Patient denies any dizziness, chest pain or shortness of breath.  Patient states at home readings in high 40s and low 50s. - Patient states that she has resumed taking propranolol  regularly in the last couple of weeks for tremor control. - Instructed to stop propranolol  and monitor heart rate. - Patient will send us  the readings after stopping propranolol .   Insomnia, unspecified type Assessment & Plan: Chronic insomnia with difficulty maintaining sleep. Previous trazodone  use discontinued due to cardiac concerns. Hydroxyzine  suggested for sleep. - Prescribe hydroxyzine  10 mg at bedtime. - Follow up in one month to assess hydroxyzine  effectiveness  Orders: -     hydrOXYzine  HCl; Take 1 tablet (10 mg total) by mouth at bedtime.  Dispense: 30 tablet; Refill: 0    Follow-up: Return in about 1 month (around 05/17/2024) for PCP for insomnia.   Delmus Warwick, NP

## 2024-05-05 ENCOUNTER — Telehealth: Payer: Self-pay | Admitting: Nurse Practitioner

## 2024-05-05 DIAGNOSIS — R001 Bradycardia, unspecified: Secondary | ICD-10-CM | POA: Insufficient documentation

## 2024-05-05 NOTE — Assessment & Plan Note (Signed)
 Chronic insomnia with difficulty maintaining sleep. Previous trazodone  use discontinued due to cardiac concerns. Hydroxyzine  suggested for sleep. - Prescribe hydroxyzine  10 mg at bedtime. - Follow up in one month to assess hydroxyzine  effectiveness

## 2024-05-05 NOTE — Assessment & Plan Note (Signed)
 Bradycardia with heart rate of 49.  Patient denies any dizziness, chest pain or shortness of breath.  Patient states at home readings in high 40s and low 50s. - Patient states that she has resumed taking propranolol  regularly in the last couple of weeks for tremor control. - Instructed to stop propranolol  and monitor heart rate. - Patient will send us  the readings after stopping propranolol .

## 2024-05-07 NOTE — Telephone Encounter (Signed)
 Noted! Thank you

## 2024-05-07 NOTE — Telephone Encounter (Signed)
 Called Patient and she states after stopping the Propranolol  her heart rate has returned back to the sixties.

## 2024-05-09 ENCOUNTER — Other Ambulatory Visit: Payer: Self-pay | Admitting: Surgery

## 2024-05-14 ENCOUNTER — Other Ambulatory Visit: Payer: Self-pay | Admitting: Nurse Practitioner

## 2024-05-14 ENCOUNTER — Other Ambulatory Visit: Payer: Self-pay | Admitting: Surgery

## 2024-05-14 DIAGNOSIS — R070 Pain in throat: Secondary | ICD-10-CM | POA: Diagnosis not present

## 2024-05-14 DIAGNOSIS — J029 Acute pharyngitis, unspecified: Secondary | ICD-10-CM | POA: Diagnosis not present

## 2024-05-15 ENCOUNTER — Other Ambulatory Visit: Payer: Self-pay

## 2024-05-15 ENCOUNTER — Encounter
Admission: RE | Admit: 2024-05-15 | Discharge: 2024-05-15 | Disposition: A | Discharge status: Home / Self Care | Source: Ambulatory Visit | Attending: Surgery | Admitting: Surgery

## 2024-05-15 DIAGNOSIS — Z79899 Other long term (current) drug therapy: Secondary | ICD-10-CM

## 2024-05-15 DIAGNOSIS — Z01812 Encounter for preprocedural laboratory examination: Secondary | ICD-10-CM

## 2024-05-15 HISTORY — DX: Unspecified osteoarthritis, unspecified site: M19.90

## 2024-05-15 NOTE — Patient Instructions (Addendum)
 Your procedure is scheduled on: 05/22/24 - Wednesday Report to the Registration Desk on the 1st floor of the [facility]. To find out your arrival time, please call [phone] between 1PM - 3PM on: 05/21/24 - Tuesday If your arrival time is 6:00 am, do not arrive before that time as the [facility] entrance doors do not open until 6:00 am.  REMEMBER: Instructions that are not followed completely may result in serious medical risk, up to and including death; or upon the discretion of your surgeon and anesthesiologist your surgery may need to be rescheduled.  Do not eat food after midnight the night before surgery.  No gum chewing or hard candies.  You may however, drink CLEAR liquids up to 2 hours before you are scheduled to arrive for your surgery. Do not drink anything within 2 hours of your scheduled arrival time.  Clear liquids include: - water   - apple juice without pulp - gatorade (not RED colors) - black coffee or tea (Do NOT add milk or creamers to the coffee or tea) Do NOT drink anything that is not on this list.  In addition, your doctor has ordered for you to drink the provided:  Ensure Pre-Surgery Clear Carbohydrate Drink  Drinking this carbohydrate drink up to two hours before surgery helps to reduce insulin resistance and improve patient outcomes. Please complete drinking 2 hours before scheduled arrival time.  One week prior to surgery: Stop Anti-inflammatories (NSAIDS) such as Advil, Aleve, Ibuprofen, Motrin, Naproxen, Naprosyn and Aspirin based products such as Excedrin, Goody's Powder, BC Powder. You may continue to take Tylenol  if needed for pain up until the day of surgery.   Stop ANY OVER THE COUNTER supplements until after surgery. Stop calcium , vitamin D , vitamin B12, melatonin, meloxicam, multiple vitamin.   Continue taking all of your other prescription medications up until the day of surgery.   ON THE DAY OF SURGERY ONLY TAKE THESE MEDICATIONS WITH SIPS  OF WATER :   Atorvastatin  (Lipitor) Bupropion  (Wellbutrin ) Pantoprazole  (Protonix )      No Alcohol for 24 hours before or after surgery.  No Smoking including e-cigarettes for 24 hours before surgery.  No chewable tobacco products for at least 6 hours before surgery.  No nicotine patches on the day of surgery.  Do not use any recreational drugs for at least a week (preferably 2 weeks) before your surgery.  Please be advised that the combination of cocaine and anesthesia may have negative outcomes, up to and including death. If you test positive for cocaine, your surgery will be cancelled.  On the morning of surgery brush your teeth with toothpaste and water , you may rinse your mouth with mouthwash if you wish. Do not swallow any toothpaste or mouthwash.  Use CHG Soap or wipes as directed on instruction sheet.  Do not wear jewelry, make-up, hairpins, clips or nail polish.  For welded (permanent) jewelry: bracelets, anklets, waist bands, etc.  Please have this removed prior to surgery.  If it is not removed, there is a chance that hospital personnel will need to cut it off on the day of surgery.  Do not wear lotions, powders, or perfumes.   Do not shave body hair from the neck down 48 hours before surgery.  Contact lenses, hearing aids and dentures may not be worn into surgery.  Do not bring valuables to the hospital. [facility] is not responsible for any missing/lost belongings or valuables.   Notify your doctor if there is any change in your  medical condition (cold, fever, infection).  Wear comfortable clothing (specific to your surgery type) to the hospital.  After surgery, you can help prevent lung complications by doing breathing exercises.  Take deep breaths and cough every 1-2 hours. Your doctor may order a device called an Incentive Spirometer to help you take deep breaths.  When coughing or sneezing, hold a pillow firmly against your incision with both hands. This  is called "splinting." Doing this helps protect your incision. It also decreases belly discomfort.  If you are being admitted to the hospital overnight, leave your suitcase in the car. After surgery it may be brought to your room.  In case of increased patient census, it may be necessary for you, the patient, to continue your postoperative care in the Same Day Surgery department.  If you are being discharged the day of surgery, you will not be allowed to drive home. You will need a responsible individual to drive you home and stay with you for 24 hours after surgery.   If you are taking public transportation, you will need to have a responsible individual with you.  Please call the Pre-admissions Testing Dept. at [phone] if you have any questions about these instructions.  Surgery Visitation Policy:  Patients having surgery or a procedure may have two visitors.  Children under the age of 60 must have an adult with them who is not the patient.  Inpatient Visitation:    Visiting hours are 7 a.m. to 8 p.m. Up to four visitors are allowed at one time in a patient room. The visitors may rotate out with other people during the day.  One visitor age 44 or older may stay with the patient overnight and must be in the room by 8 p.m.   Merchandiser, retail to address health-related social needs:  [email or web address]                                                                                                             Preparing for Surgery with CHLORHEXIDINE  GLUCONATE (CHG) Soap  Chlorhexidine  Gluconate (CHG) Soap  o An antiseptic cleaner that kills germs and bonds with the skin to continue killing germs even after washing  o Used for showering the night before surgery and morning of surgery  Before surgery, you can play an important role by reducing the number of germs on your skin.  CHG (Chlorhexidine  gluconate) soap is an antiseptic cleanser which kills  germs and bonds with the skin to continue killing germs even after washing.  Please do not use if you have an allergy to CHG or antibacterial soaps. If your skin becomes reddened/irritated stop using the CHG.  1. Shower the NIGHT BEFORE SURGERY and the MORNING OF SURGERY with CHG soap.  2. If you choose to wash your hair, wash your hair first as usual with your normal shampoo.  3. After shampooing, rinse your hair and body thoroughly to remove the shampoo.  4. Use CHG as you would any other liquid soap. You can apply CHG directly  to the skin and wash gently with a scrungie or a clean washcloth.  5. Apply the CHG soap to your body only from the neck down. Do not use on open wounds or open sores. Avoid contact with your eyes, ears, mouth, and genitals (private parts). Wash face and genitals (private parts) with your normal soap.  6. Wash thoroughly, paying special attention to the area where your surgery will be performed.  7. Thoroughly rinse your body with warm water .  8. Do not shower/wash with your normal soap after using and rinsing off the CHG soap.  9. Pat yourself dry with a clean towel.  10. Wear clean pajamas to bed the night before surgery.  12. Place clean sheets on your bed the night of your first shower and do not sleep with pets.  13. Shower again with the CHG soap on the day of surgery prior to arriving at the hospital.  14. Do not apply any deodorants/lotions/powders.  15. Please wear clean clothes to the hospital.  How to Use an Incentive Spirometer  An incentive spirometer is a tool that measures how well you are filling your lungs with each breath. Learning to take long, deep breaths using this tool can help you keep your lungs clear and active. This may help to reverse or lessen your chance of developing breathing (pulmonary) problems, especially infection. You may be asked to use a spirometer: After a surgery. If you have a lung problem or a history of  smoking. After a long period of time when you have been unable to move or be active. If the spirometer includes an indicator to show the highest number that you have reached, your health care provider or respiratory therapist will help you set a goal. Keep a log of your progress as told by your health care provider. What are the risks? Breathing too quickly may cause dizziness or cause you to pass out. Take your time so you do not get dizzy or light-headed. If you are in pain, you may need to take pain medicine before doing incentive spirometry. It is harder to take a deep breath if you are having pain. How to use your incentive spirometer  Sit up on the edge of your bed or on a chair. Hold the incentive spirometer so that it is in an upright position. Before you use the spirometer, breathe out normally. Place the mouthpiece in your mouth. Make sure your lips are closed tightly around it. Breathe in slowly and as deeply as you can through your mouth, causing the piston or the ball to rise toward the top of the chamber. Hold your breath for 3-5 seconds, or for as long as possible. If the spirometer includes a coach indicator, use this to guide you in breathing. Slow down your breathing if the indicator goes above the marked areas. Remove the mouthpiece from your mouth and breathe out normally. The piston or ball will return to the bottom of the chamber. Rest for a few seconds, then repeat the steps 10 or more times. Take your time and take a few normal breaths between deep breaths so that you do not get dizzy or light-headed. Do this every 1-2 hours when you are awake. If the spirometer includes a goal marker to show the highest number you have reached (best effort), use this as a goal to work toward during each repetition. After each set of 10 deep breaths, cough a few times. This will help to make sure that your  lungs are clear. If you have an incision on your chest or abdomen from surgery,  place a pillow or a rolled-up towel firmly against the incision when you cough. This can help to reduce pain while taking deep breaths and coughing. General tips When you are able to get out of bed: Walk around often. Continue to take deep breaths and cough in order to clear your lungs. Keep using the incentive spirometer until your health care provider says it is okay to stop using it. If you have been in the hospital, you may be told to keep using the spirometer at home. Contact a health care provider if: You are having difficulty using the spirometer. You have trouble using the spirometer as often as instructed. Your pain medicine is not giving enough relief for you to use the spirometer as told. You have a fever. Get help right away if: You develop shortness of breath. You develop a cough with bloody mucus from the lungs. You have fluid or blood coming from an incision site after you cough. Summary An incentive spirometer is a tool that can help you learn to take long, deep breaths to keep your lungs clear and active. You may be asked to use a spirometer after a surgery, if you have a lung problem or a history of smoking, or if you have been inactive for a long period of time. Use your incentive spirometer as instructed every 1-2 hours while you are awake. If you have an incision on your chest or abdomen, place a pillow or a rolled-up towel firmly against your incision when you cough. This will help to reduce pain. Get help right away if you have shortness of breath, you cough up bloody mucus, or blood comes from your incision when you cough. This information is not intended to replace advice given to you by your health care provider. Make sure you discuss any questions you have with your health care provider. Document Revised: 11/11/2019 Document Reviewed: 11/11/2019 Elsevier Patient Education  2023 [employer].

## 2024-05-17 ENCOUNTER — Ambulatory Visit: Admitting: Nurse Practitioner

## 2024-05-17 ENCOUNTER — Encounter
Admission: RE | Admit: 2024-05-17 | Discharge: 2024-05-17 | Disposition: A | Discharge status: Home / Self Care | Source: Ambulatory Visit | Attending: Surgery | Admitting: Surgery

## 2024-05-17 DIAGNOSIS — Z01812 Encounter for preprocedural laboratory examination: Secondary | ICD-10-CM | POA: Insufficient documentation

## 2024-05-17 DIAGNOSIS — Z79899 Other long term (current) drug therapy: Secondary | ICD-10-CM | POA: Insufficient documentation

## 2024-05-17 DIAGNOSIS — D2360 Other benign neoplasm of skin of unspecified upper limb, including shoulder: Secondary | ICD-10-CM | POA: Diagnosis not present

## 2024-05-17 LAB — BASIC METABOLIC PANEL WITH GFR
Anion gap: 10 (ref 5–15)
BUN: 17 mg/dL (ref 8–23)
CO2: 24 mmol/L (ref 22–32)
Calcium: 9 mg/dL (ref 8.9–10.3)
Chloride: 101 mmol/L (ref 98–111)
Creatinine, Ser: 1.4 mg/dL — ABNORMAL HIGH (ref 0.44–1.00)
GFR, Estimated: 38 mL/min — ABNORMAL LOW (ref >60)
Glucose, Bld: 106 mg/dL — ABNORMAL HIGH (ref 70–99)
Potassium: 3.6 mmol/L (ref 3.5–5.1)
Sodium: 135 mmol/L (ref 135–145)

## 2024-05-22 ENCOUNTER — Ambulatory Visit: Payer: Self-pay | Admitting: Urgent Care

## 2024-05-22 ENCOUNTER — Ambulatory Visit

## 2024-05-22 ENCOUNTER — Encounter
Admission: RE | Disposition: A | Discharge status: Home / Self Care | Payer: Self-pay | Source: Home / Self Care | Attending: Surgery

## 2024-05-22 ENCOUNTER — Encounter: Payer: Self-pay | Admitting: Surgery

## 2024-05-22 ENCOUNTER — Ambulatory Visit
Admission: RE | Admit: 2024-05-22 | Discharge: 2024-05-22 | Disposition: A | Discharge status: Home / Self Care | Attending: Surgery | Admitting: Surgery

## 2024-05-22 ENCOUNTER — Other Ambulatory Visit: Payer: Self-pay

## 2024-05-22 DIAGNOSIS — I10 Essential (primary) hypertension: Secondary | ICD-10-CM | POA: Diagnosis not present

## 2024-05-22 DIAGNOSIS — D2362 Other benign neoplasm of skin of left upper limb, including shoulder: Secondary | ICD-10-CM | POA: Diagnosis not present

## 2024-05-22 DIAGNOSIS — M71342 Other bursal cyst, left hand: Secondary | ICD-10-CM | POA: Diagnosis not present

## 2024-05-22 DIAGNOSIS — K219 Gastro-esophageal reflux disease without esophagitis: Secondary | ICD-10-CM | POA: Diagnosis not present

## 2024-05-22 DIAGNOSIS — M25842 Other specified joint disorders, left hand: Secondary | ICD-10-CM | POA: Insufficient documentation

## 2024-05-22 HISTORY — PX: CYST REMOVAL HAND: SHX6279

## 2024-05-22 SURGERY — REMOVAL, CYST, HAND
Anesthesia: General | Site: Middle Finger | Laterality: Left

## 2024-05-22 MED ORDER — GLYCOPYRROLATE 0.2 MG/ML IJ SOLN
INTRAMUSCULAR | Status: AC
  Filled 2024-05-22: qty 1

## 2024-05-22 MED ORDER — KETAMINE HCL 50 MG/5ML IJ SOSY
PREFILLED_SYRINGE | INTRAMUSCULAR | Status: DC | PRN
Start: 2024-05-22 — End: 2024-05-22
  Administered 2024-05-22: 30 mg via INTRAVENOUS

## 2024-05-22 MED ORDER — BUPIVACAINE HCL (PF) 0.5 % IJ SOLN
INTRAMUSCULAR | Status: AC
  Filled 2024-05-22: qty 30

## 2024-05-22 MED ORDER — MIDAZOLAM HCL 2 MG/2ML IJ SOLN
INTRAMUSCULAR | Status: AC
  Filled 2024-05-22: qty 2

## 2024-05-22 MED ORDER — BUPIVACAINE HCL (PF) 0.5 % IJ SOLN
INTRAMUSCULAR | Status: DC | PRN
  Administered 2024-05-22: 15 mL

## 2024-05-22 MED ORDER — SODIUM CHLORIDE 0.9 % IV SOLN: INTRAVENOUS | Status: DC

## 2024-05-22 MED ORDER — OXYCODONE HCL 5 MG/5ML PO SOLN: 5.0000 mg | Freq: Once | ORAL | Status: DC | PRN

## 2024-05-22 MED ORDER — METOCLOPRAMIDE HCL 10 MG PO TABS: 5.0000 mg | ORAL_TABLET | Freq: Three times a day (TID) | ORAL | Status: DC | PRN

## 2024-05-22 MED ORDER — METOCLOPRAMIDE HCL 5 MG/ML IJ SOLN: 5.0000 mg | Freq: Three times a day (TID) | INTRAMUSCULAR | Status: DC | PRN

## 2024-05-22 MED ORDER — ONDANSETRON HCL 4 MG/2ML IJ SOLN: 4.0000 mg | Freq: Four times a day (QID) | INTRAMUSCULAR | Status: DC | PRN

## 2024-05-22 MED ORDER — HYDROCODONE-ACETAMINOPHEN 5-325 MG PO TABS: 1.0000 | ORAL_TABLET | ORAL | Status: DC | PRN

## 2024-05-22 MED ORDER — CEFAZOLIN SODIUM-DEXTROSE 2-4 GM/100ML-% IV SOLN
2.0000 g | INTRAVENOUS | Status: AC
Start: 2024-05-22 — End: 2024-05-22
  Administered 2024-05-22: 2 g via INTRAVENOUS

## 2024-05-22 MED ORDER — ACETAMINOPHEN 325 MG PO TABS: 325.0000 mg | ORAL_TABLET | Freq: Four times a day (QID) | ORAL | Status: DC | PRN

## 2024-05-22 MED ORDER — 0.9 % SODIUM CHLORIDE (POUR BTL) OPTIME
TOPICAL | Status: DC | PRN
  Administered 2024-05-22: 500 mL

## 2024-05-22 MED ORDER — HYDROCODONE-ACETAMINOPHEN 5-325 MG PO TABS: 1.0000 | ORAL_TABLET | Freq: Four times a day (QID) | ORAL | 0 refills | Status: AC | PRN

## 2024-05-22 MED ORDER — LIDOCAINE HCL (CARDIAC) PF 100 MG/5ML IV SOSY
PREFILLED_SYRINGE | INTRAVENOUS | Status: DC | PRN
  Administered 2024-05-22: 100 mg via INTRAVENOUS

## 2024-05-22 MED ORDER — CHLORHEXIDINE GLUCONATE 0.12 % MT SOLN
15.0000 mL | Freq: Once | OROMUCOSAL | Status: AC
  Administered 2024-05-22: 15 mL via OROMUCOSAL

## 2024-05-22 MED ORDER — PROPOFOL 10 MG/ML IV BOLUS
INTRAVENOUS | Status: DC | PRN
  Administered 2024-05-22: 100 mg via INTRAVENOUS
  Administered 2024-05-22: 140 mg via INTRAVENOUS
  Administered 2024-05-22: 125 ug/kg/min via INTRAVENOUS

## 2024-05-22 MED ORDER — ORAL CARE MOUTH RINSE: 15.0000 mL | Freq: Once | OROMUCOSAL | Status: AC

## 2024-05-22 MED ORDER — OXYCODONE HCL 5 MG PO TABS: 5.0000 mg | ORAL_TABLET | Freq: Once | ORAL | Status: DC | PRN

## 2024-05-22 MED ORDER — ONDANSETRON HCL 4 MG PO TABS: 4.0000 mg | ORAL_TABLET | Freq: Four times a day (QID) | ORAL | Status: DC | PRN

## 2024-05-22 MED ORDER — CEFAZOLIN SODIUM-DEXTROSE 2-4 GM/100ML-% IV SOLN
INTRAVENOUS | Status: AC
  Filled 2024-05-22: qty 100

## 2024-05-22 MED ORDER — MIDAZOLAM HCL 2 MG/2ML IJ SOLN
INTRAMUSCULAR | Status: DC | PRN
Start: 2024-05-22 — End: 2024-05-22
  Administered 2024-05-22: 2 mg via INTRAVENOUS

## 2024-05-22 MED ORDER — CHLORHEXIDINE GLUCONATE 0.12 % MT SOLN
OROMUCOSAL | Status: AC
  Filled 2024-05-22: qty 15

## 2024-05-22 MED ORDER — KETAMINE HCL 50 MG/5ML IJ SOSY
PREFILLED_SYRINGE | INTRAMUSCULAR | Status: AC
  Filled 2024-05-22: qty 5

## 2024-05-22 MED ORDER — GLYCOPYRROLATE 0.2 MG/ML IJ SOLN
INTRAMUSCULAR | Status: DC | PRN
Start: 2024-05-22 — End: 2024-05-22
  Administered 2024-05-22: .2 mg via INTRAVENOUS

## 2024-05-22 MED ORDER — FENTANYL CITRATE (PF) 100 MCG/2ML IJ SOLN: 25.0000 ug | INTRAMUSCULAR | Status: DC | PRN

## 2024-05-22 MED ORDER — PHENYLEPHRINE 80 MCG/ML (10ML) SYRINGE FOR IV PUSH (FOR BLOOD PRESSURE SUPPORT)
PREFILLED_SYRINGE | INTRAVENOUS | Status: AC
  Filled 2024-05-22: qty 10

## 2024-05-22 MED ORDER — LACTATED RINGERS IV SOLN: INTRAVENOUS | Status: DC

## 2024-05-22 SURGICAL SUPPLY — 25 items
BNDG COHESIVE 4X5 TAN STRL LF (GAUZE/BANDAGES/DRESSINGS) ×1 IMPLANT
BNDG ELASTIC 2INX 5YD STR LF (GAUZE/BANDAGES/DRESSINGS) ×1 IMPLANT
BNDG ESMARCH 4X12 STRL LF (GAUZE/BANDAGES/DRESSINGS) ×1 IMPLANT
CHLORAPREP W/TINT 26 (MISCELLANEOUS) ×2 IMPLANT
CORD BIP STRL DISP 12FT (MISCELLANEOUS) ×1 IMPLANT
CUFF TOURN SGL QUICK 18X4 (TOURNIQUET CUFF) IMPLANT
FORCEPS JEWEL BIP 4-3/4 STR (INSTRUMENTS) ×1 IMPLANT
GAUZE SPONGE 4X4 12PLY STRL (GAUZE/BANDAGES/DRESSINGS) ×1 IMPLANT
GAUZE XEROFORM 1X8 LF (GAUZE/BANDAGES/DRESSINGS) ×1 IMPLANT
GLOVE BIO SURGEON STRL SZ8 (GLOVE) ×1 IMPLANT
GLOVE INDICATOR 8.0 STRL GRN (GLOVE) ×1 IMPLANT
GOWN STRL REUS W/ TWL XL LVL3 (GOWN DISPOSABLE) ×1 IMPLANT
KIT TURNOVER KIT A (KITS) ×1 IMPLANT
MANIFOLD NEPTUNE II (INSTRUMENTS) ×1 IMPLANT
NS IRRIG 500ML POUR BTL (IV SOLUTION) ×1 IMPLANT
PACK EXTREMITY ARMC (MISCELLANEOUS) ×1 IMPLANT
PENCIL SMOKE EVACUATOR (MISCELLANEOUS) ×1 IMPLANT
SPLINT LT WRIST LG 8.5 (SOFTGOODS) IMPLANT
SPLINT LT WRIST MD 8 (SOFTGOODS) IMPLANT
SPLINT RT WRIST LG 8.5 (SOFTGOODS) IMPLANT
SPLINT RT WRIST MD 8 (SOFTGOODS) IMPLANT
STOCKINETTE IMPERVIOUS 9X36 MD (GAUZE/BANDAGES/DRESSINGS) ×1 IMPLANT
SUT PROLENE 4 0 PS 2 18 (SUTURE) ×1 IMPLANT
TRAP FLUID SMOKE EVACUATOR (MISCELLANEOUS) ×1 IMPLANT
WATER STERILE IRR 500ML POUR (IV SOLUTION) ×1 IMPLANT

## 2024-05-22 NOTE — Op Note (Signed)
 05/22/2024  10:39 AM  Patient:   Lindsey Blair  Pre-Op Diagnosis:   Recurrent mucoid cyst left long finger DIP joint.  Post-Op Diagnosis:   Same  Procedure:   Excision of recurrent mucoid cyst left long finger DIP joint  Surgeon:   DOROTHA Reyes Maltos, MD  Assistant:   None  Anesthesia:   General LMA  Findings:   As above.  Complications:   None  Fluids:   400 cc crystalloid  EBL:   0 cc  UOP:   None  TT:   19 minutes at 250 mmHg  Drains:   None  Closure:   4-0 Prolene interrupted sutures  Brief Clinical Note:   The patient is a 77 year old female who had undergone an excision of a mucoid cyst involving the DIP joint of the left long finger 3 months ago.  This cyst recurred about 1 month ago.  The patient presents at this time for excision of the recurrent mucoid cyst of the left long DIP joint.  Procedure:   The patient was brought into the operating room and laid in the supine position.  After adequate general laryngeal mask anesthesia was obtained, the patient's left hand and upper extremity were prepped with ChloraPrep solution before being draped sterilely.  Preoperative antibiotics were administered.  A timeout was performed to verify the appropriate surgical site.  An L-shaped incision was made over the dorsal ulnar aspect of the left long DIP joint.  The incision was carried down into the subcutaneous tissues and the flap of skin elevated to expose the recurrent cyst was surrounding scar tissue.  This area was ellipsed sharply, protecting the extensor tendon, and removed, the sent to pathology for formal evaluation.  The dorsal ulnar margin of the DIP joint was opened and a small capsulectomy performed to try to reduce the likelihood of recurrence of the cyst.  The wound was copiously irrigated with sterile saline solution before being closed using 4-0 Prolene interrupted sutures.  A sterile bulky compressive dressing was applied to the finger before the patient was  awakened, extubated, and returned to the recovery room in satisfactory condition.  Of note, a digital block using 15 cc of 0.5% plain Sensorcaine  was performed to help with postoperative pain relief.

## 2024-05-22 NOTE — Anesthesia Preprocedure Evaluation (Signed)
 Anesthesia Evaluation  Patient identified by MRN, date of birth, ID band Patient awake    Reviewed: Allergy & Precautions, NPO status , Patient's Chart, lab work & pertinent test results  History of Anesthesia Complications Negative for: history of anesthetic complications  Airway Mallampati: III  TM Distance: <3 FB Neck ROM: full    Dental  (+) Chipped   Pulmonary neg shortness of breath, former smoker   Pulmonary exam normal        Cardiovascular Exercise Tolerance: Good hypertension, (-) angina (-) Past MI Normal cardiovascular exam     Neuro/Psych  PSYCHIATRIC DISORDERS      negative neurological ROS     GI/Hepatic Neg liver ROS,GERD  Controlled,,  Endo/Other  negative endocrine ROS    Renal/GU Renal disease     Musculoskeletal   Abdominal   Peds  Hematology negative hematology ROS (+)   Anesthesia Other Findings Past Medical History: No date: Anxiety No date: Arthritis No date: Benign essential tremor Several instances of the years: Cancer [facility])     Comment:  Skin No date: Depression No date: Heart murmur No date: Hyperlipidemia No date: Hypertension 01/2024: Mass of soft tissue of hand     Comment:  dorsal aspect of left long DIP joint No date: Osteopenia No date: Reflux esophagitis No date: Stage 3a chronic kidney disease (CKD) (HCC) No date: Systemic lupus erythematosus (HCC) No date: Vertigo     Comment:  rare  Past Surgical History: No date: BASAL CELL CARCINOMA EXCISION     Comment:  several face, nose 2000: BREAST BIOPSY; Right     Comment:  Negative 08/05/2019: CATARACT EXTRACTION W/ INTRAOCULAR LENS IMPLANT; Right No date: CATARACT EXTRACTION W/ INTRAOCULAR LENS IMPLANT; Left 10/22/2015: COLONOSCOPY WITH PROPOFOL ; N/A     Comment:  Procedure: COLONOSCOPY WITH PROPOFOL ;  Surgeon: Rogelia Copping, MD;  Location: [facility];  Service:               Endoscopy;   Laterality: N/A; 10/22/2015: ESOPHAGOGASTRODUODENOSCOPY (EGD) WITH PROPOFOL ; N/A     Comment:  Procedure: ESOPHAGOGASTRODUODENOSCOPY (EGD) WITH               PROPOFOL ;  Surgeon: Rogelia Copping, MD;  Location: [facility];  Service: Endoscopy;  Laterality: N/A; 02/21/2024: EXCISION MASS UPPER EXTREMETIES; Left     Comment:  Procedure: EXCISION MASS UPPER EXTREMITIES;  Surgeon:               Edie Norleen PARAS, MD;  Location: [facility] OR[facility];  Service:               Orthopedics;  Laterality: Left; 04/06/2011: IMAGE GUIDED SINUS SURGERY     Comment:  [facility], Dr. Milissa No date: MASTOIDECTOMY No date: MOHS SURGERY  BMI    Body Mass Index: 22.31 kg/m      Reproductive/Obstetrics negative OB ROS                              Anesthesia Physical Anesthesia Plan  ASA: 2  Anesthesia Plan: General LMA   Post-op Pain Management:    Induction: Intravenous  PONV Risk Score and Plan: Dexamethasone , Ondansetron , Midazolam  and Treatment may vary due to age or medical condition  Airway Management Planned: LMA  Additional Equipment:   Intra-op Plan:  Post-operative Plan: Extubation in OR  Informed Consent: I have reviewed the patients History and Physical, chart, labs and discussed the procedure including the risks, benefits and alternatives for the proposed anesthesia with the patient or authorized representative who has indicated his/her understanding and acceptance.     Dental Advisory Given  Plan Discussed with: Anesthesiologist, CRNA and Surgeon  Anesthesia Plan Comments: (Patient consented for risks of anesthesia including but not limited to:  - adverse reactions to medications - damage to eyes, teeth, lips or other oral mucosa - nerve damage due to positioning  - sore throat or hoarseness - Damage to heart, brain, nerves, lungs, other parts of body or loss of life  Patient voiced understanding and assent.)        Anesthesia Quick  Evaluation

## 2024-05-22 NOTE — Anesthesia Postprocedure Evaluation (Signed)
 Anesthesia Post Note  Patient: Lindsey Blair  Procedure(s) Performed: REMOVAL, CYST, HAND (Left: Middle Finger)  Patient location during evaluation: PACU Anesthesia Type: General Level of consciousness: awake and alert Pain management: pain level controlled Vital Signs Assessment: post-procedure vital signs reviewed and stable Respiratory status: spontaneous breathing, nonlabored ventilation and respiratory function stable Cardiovascular status: blood pressure returned to baseline and stable Postop Assessment: no apparent nausea or vomiting Anesthetic complications: no   There were no known notable events for this encounter.   Last Vitals:  Vitals:   05/22/24 1130 05/22/24 1144  BP: (!) 171/89 (!) 172/82  Pulse: (!) 53 (!) 57  Resp: 16 18  Temp: (!) 36.2 C 36.5 C  SpO2: 100% 100%    Last Pain:  Vitals:   05/22/24 1144  TempSrc: Temporal  PainSc: 0-No pain                 Fairy POUR Ziyan Hillmer

## 2024-05-22 NOTE — Anesthesia Procedure Notes (Signed)
 Procedure Name: LMA Insertion Date/Time: 05/22/2024 9:54 AM  Performed by: Jovonte Commins R, CRNAPre-anesthesia Checklist: Patient identified, Emergency Drugs available, Suction available, Patient being monitored and Timeout performed Patient Re-evaluated:Patient Re-evaluated prior to induction Oxygen Delivery Method: Circle system utilized Preoxygenation: Pre-oxygenation with 100% oxygen Induction Type: IV induction LMA: LMA inserted LMA Size: 3.0 Number of attempts: 1 Placement Confirmation: positive ETCO2 and breath sounds checked- equal and bilateral Tube secured with: Tape Dental Injury: Teeth and Oropharynx as per pre-operative assessment

## 2024-05-22 NOTE — Transfer of Care (Signed)
 Immediate Anesthesia Transfer of Care Note  Patient: Lindsey Blair  Procedure(s) Performed: REMOVAL, CYST, HAND (Left: Middle Finger)  Patient Location: PACU  Anesthesia Type:General  Level of Consciousness: sedated  Airway & Oxygen Therapy: Patient Spontanous Breathing  Post-op Assessment: Report given to RN and Post -op Vital signs reviewed and stable  Post vital signs: Reviewed and stable  Last Vitals:  Vitals Value Taken Time  BP 140/66 05/22/24 10:40  Temp    Pulse 66 05/22/24 10:45  Resp 13 05/22/24 10:45  SpO2 94 % 05/22/24 10:45  Vitals shown include unfiled device data.  Last Pain:  Vitals:   05/22/24 0757  TempSrc: Temporal  PainSc: 0-No pain         Complications: There were no known notable events for this encounter.

## 2024-05-22 NOTE — H&P (Signed)
 History of Present Illness:  Lindsey Blair is a 78 y.o. female who presents today for reevaluation of her mucoid cyst on her left long finger. Patient had previously undergone surgery in June earlier this year to have this cyst removed from this portion of her left long finger. Postoperatively, the patient did well for about a month, before she reported that the cyst seem to continue to grow back and create some problems including some tightness with flexion of this finger and intermittent discomfort, notably when she hits this area. She had followed up with Dr. Edie at her 6-week postop who suggested that she undergo another minor procedure to potentially have this remaining cyst removed. She denies having any previous incisional issues. No redness, drainage or pus along these margins. Denies any numbness, tingling or paresthesias affecting this hand or finger. She has requested operative intervention for relief of her DJD symptoms.   Social Hx: Patient is retired. She does admit to alcohol use, approximately 1 standard drink per night. Denies any illicit drug use, smoking or nicotine use.  Allergies: Penicillins Rash   Medicines: hydrOXYzine  HCL (ATARAX ) 10 MG tablet Take 10 mg by mouth at bedtime  acetaminophen  (TYLENOL ) 650 MG ER tablet Take 650 mg by mouth  amLODIPine  (NORVASC ) 10 MG tablet Take by mouth  amLODIPine -benazepril  (LOTREL) 10-20 mg capsule Take 1 capsule by mouth once daily  ascorbic acid, vitamin C, (VITAMIN C) 1000 MG tablet Take 1,000 mg by mouth once daily  atorvastatin  (LIPITOR) 20 MG tablet Take 20 mg by mouth once daily  buPROPion  (WELLBUTRIN  SR) 150 MG SR tablet Take 200 mg by mouth 2 (two) times daily  calcium  carbonate 500 mg calcium  (1,250 mg) tablet Take 500 mg of elemental by mouth 2 (two) times daily with meals  cholecalciferol 1000 unit tablet Take by mouth  clobetasoL (TEMOVATE) 0.05 % cream  cyanocobalamin  (VITAMIN B12) 1000 MCG tablet Take 1,000 mcg by mouth once  daily  estradioL  (ESTRACE ) 0.01 % (0.1 mg/gram) vaginal cream Place vaginally  hydroCHLOROthiazide  (HYDRODIURIL ) 12.5 MG tablet  hydrOXYchloroQUINE (PLAQUENIL) 200 mg tablet Take 200 mg by mouth 1 tablet in the morning and 0.5 tablet at night  melatonin 3 mg tablet Take 3 mg by mouth  meloxicam (MOBIC) 15 MG tablet  multivitamin with minerals tablet Take by mouth  multivitamin with minerals tablet Take by mouth once daily  pantoprazole  (PROTONIX ) 40 MG DR tablet Take by mouth  pantoprazole  (PROTONIX ) 40 MG DR tablet Take 40 mg by mouth once daily  propranoloL  (INDERAL ) 20 MG tablet Take 20 mg by mouth 2 (two) times daily  testosterone  (ANDROGEL ) 20.25 mg/1.25 gram (1.62 %) gel in metered dose pump Place onto the skin Place 1 application onto the skin 3 (three) times a week. I am unable to make the computer do a 4% gel but please use  triamcinolone  0.1 % cream   Past Medical History:  Hypertension   Past Surgical History:  EXCISION SOFT TISSUE MASS LEFT LONG DIP JOINT 02/21/2024 (Dr. Edie)    Physical Exam   Ht:157.5 cm (5' 2) Wt:58.1 kg (128 lb) BMI: Body mass index is 23.41 kg/m.  General/Constitutional: No apparent distress: well-nourished and well developed. Eyes: Pupils equal, round with synchronous movement. Lymphatic: No palpable adenopathy. Respiratory: Patient has good chest rise and fall with inspiration and expiration. All lung fields are clear to auscultation bilaterally. There is no Rales, rhonchi or wheezes appreciated. Cardiovascular: Upon auscultation there is a regular rate and rhythm with a slight grade  2 systolic murmur best heard along the aortic region. No rubs, gallops or heaves appreciated. There does not appear to be any swelling down the lower extremities. Posterior tibial pulses appreciated bilaterally, 2+. Integumentary: No impressive skin lesions present, except as noted in detailed exam. Neuro/Psych: Normal mood and affect, oriented to person, place and  time. Musculoskeletal: see exam below  Left hand exam: Upon inspection of the patient's left hand and long finger, there does appear to be a noticeable raised growth along the dorsal margins of her left long finger nearest the DIP joint. This growth is approximately the size of a pen cap tip. There is no noticeable surrounding erythema, drainage or swelling. It is hard to the touch and minimally mobile. There is a previously healed scar from her last incision present that seems to be well-healed without any issue. She is able to engage with relative normal flexion and extension capabilities according to the patient's baseline, does report a tightness and some discomfort along these margins with flexion notably. She is neurovascular intact all dermatomes extending down her left upper extremity and into her left hand and all of her fingers. Radial pulses appreciated, 2+. Cap refill less than 2 seconds in each finger.  Imaging: None ordered today.  Assessment: Benign neoplasm of skin of finger.   Plan: The treatment options were discussed with the patient. In addition, patient educational materials were provided regarding the diagnosis and treatment options. The patient is quite frustrated by the recurrence of this mass and its associated functional limitations. She wants it gone. Therefore, I have recommended a surgical procedure, specifically a repeat excision of the mucoid cyst of her left long finger. The procedure was discussed with the patient, as were the potential risks (including bleeding, infection, nerve and/or blood vessel injury, persistent or recurrent pain, recurrence of the mass, stiffness of the finger, need for further surgery, blood clots, strokes, heart attacks and/or arhythmias, pneumonia, etc.) and benefits. The patient states her understanding and wishes to proceed. All of the patient's questions and concerns were answered. She can call any time with further concerns. She will  follow up post-surgery, routine.   H&P reviewed and patient re-examined. No changes.

## 2024-05-22 NOTE — Discharge Instructions (Signed)
 Orthopedic discharge instructions: Keep dressing dry and intact. Keep hand elevated above heart level. May shower after dressing removed on postop day 4 (Sunday). Cover sutures with Band-Aids after drying off. Apply ice to affected area frequently. Take ibuprofen 600 mg TID with meals for 3-5 days, then as necessary. Take ES Tylenol or pain medication as prescribed when needed.  Return for follow-up in 10-14 days or as scheduled.

## 2024-05-23 ENCOUNTER — Encounter: Payer: Self-pay | Admitting: Surgery

## 2024-05-24 LAB — SURGICAL PATHOLOGY

## 2024-05-28 ENCOUNTER — Ambulatory Visit: Admitting: Nurse Practitioner

## 2024-05-29 ENCOUNTER — Ambulatory Visit: Admitting: Nurse Practitioner

## 2024-07-02 DIAGNOSIS — D2272 Melanocytic nevi of left lower limb, including hip: Secondary | ICD-10-CM | POA: Diagnosis not present

## 2024-07-02 DIAGNOSIS — Z85828 Personal history of other malignant neoplasm of skin: Secondary | ICD-10-CM | POA: Diagnosis not present

## 2024-07-02 DIAGNOSIS — D225 Melanocytic nevi of trunk: Secondary | ICD-10-CM | POA: Diagnosis not present

## 2024-07-02 DIAGNOSIS — L931 Subacute cutaneous lupus erythematosus: Secondary | ICD-10-CM | POA: Diagnosis not present

## 2024-07-02 DIAGNOSIS — D2261 Melanocytic nevi of right upper limb, including shoulder: Secondary | ICD-10-CM | POA: Diagnosis not present

## 2024-07-02 DIAGNOSIS — L570 Actinic keratosis: Secondary | ICD-10-CM | POA: Diagnosis not present

## 2024-07-02 DIAGNOSIS — D2262 Melanocytic nevi of left upper limb, including shoulder: Secondary | ICD-10-CM | POA: Diagnosis not present

## 2024-08-13 ENCOUNTER — Ambulatory Visit: Admitting: Nurse Practitioner

## 2024-09-12 ENCOUNTER — Other Ambulatory Visit: Payer: Self-pay | Admitting: Nurse Practitioner

## 2024-09-18 ENCOUNTER — Other Ambulatory Visit: Payer: Self-pay | Admitting: Nurse Practitioner

## 2024-09-18 DIAGNOSIS — F3342 Major depressive disorder, recurrent, in full remission: Secondary | ICD-10-CM

## 2024-10-11 ENCOUNTER — Ambulatory Visit: Payer: HMO

## 2024-10-16 ENCOUNTER — Ambulatory Visit: Admitting: Nurse Practitioner
